# Patient Record
Sex: Male | Born: 1940 | Race: Black or African American | Hispanic: No | Smoking: Former smoker
Health system: Southern US, Community
[De-identification: ages and names within clinical notes are randomized; demographics above are authoritative.]

## PROBLEM LIST (undated history)

## (undated) DIAGNOSIS — J449 Chronic obstructive pulmonary disease, unspecified: Secondary | ICD-10-CM

## (undated) DIAGNOSIS — D573 Sickle-cell trait: Secondary | ICD-10-CM

## (undated) DIAGNOSIS — F1010 Alcohol abuse, uncomplicated: Secondary | ICD-10-CM

## (undated) DIAGNOSIS — N183 Chronic kidney disease, stage 3 (moderate): Secondary | ICD-10-CM

## (undated) DIAGNOSIS — Z0000 Encounter for general adult medical examination without abnormal findings: Secondary | ICD-10-CM

## (undated) DIAGNOSIS — L03119 Cellulitis of unspecified part of limb: Secondary | ICD-10-CM

## (undated) DIAGNOSIS — K219 Gastro-esophageal reflux disease without esophagitis: Secondary | ICD-10-CM

## (undated) DIAGNOSIS — L02419 Cutaneous abscess of limb, unspecified: Secondary | ICD-10-CM

## (undated) DIAGNOSIS — I2510 Atherosclerotic heart disease of native coronary artery without angina pectoris: Secondary | ICD-10-CM

## (undated) DIAGNOSIS — A4902 Methicillin resistant Staphylococcus aureus infection, unspecified site: Secondary | ICD-10-CM

## (undated) DIAGNOSIS — B1710 Acute hepatitis C without hepatic coma: Secondary | ICD-10-CM

## (undated) DIAGNOSIS — N529 Male erectile dysfunction, unspecified: Secondary | ICD-10-CM

## (undated) DIAGNOSIS — M25569 Pain in unspecified knee: Secondary | ICD-10-CM

## (undated) DIAGNOSIS — I10 Essential (primary) hypertension: Secondary | ICD-10-CM

## (undated) DIAGNOSIS — E785 Hyperlipidemia, unspecified: Secondary | ICD-10-CM

## (undated) DIAGNOSIS — I739 Peripheral vascular disease, unspecified: Secondary | ICD-10-CM

## (undated) DIAGNOSIS — N400 Enlarged prostate without lower urinary tract symptoms: Secondary | ICD-10-CM

## (undated) DIAGNOSIS — M1990 Unspecified osteoarthritis, unspecified site: Secondary | ICD-10-CM

## (undated) DIAGNOSIS — M109 Gout, unspecified: Secondary | ICD-10-CM

## (undated) DIAGNOSIS — N259 Disorder resulting from impaired renal tubular function, unspecified: Secondary | ICD-10-CM

## (undated) DIAGNOSIS — I6529 Occlusion and stenosis of unspecified carotid artery: Secondary | ICD-10-CM

## (undated) HISTORY — DX: Alcohol abuse, uncomplicated: F10.10

## (undated) HISTORY — PX: HAND SURGERY: SHX662

## (undated) HISTORY — DX: Occlusion and stenosis of unspecified carotid artery: I65.29

## (undated) HISTORY — DX: Encounter for general adult medical examination without abnormal findings: Z00.00

## (undated) HISTORY — DX: Chronic obstructive pulmonary disease, unspecified: J44.9

## (undated) HISTORY — DX: Sickle-cell trait: D57.3

## (undated) HISTORY — DX: Gout, unspecified: M10.9

## (undated) HISTORY — PX: OTHER SURGICAL HISTORY: SHX169

## (undated) HISTORY — DX: Male erectile dysfunction, unspecified: N52.9

## (undated) HISTORY — DX: Atherosclerotic heart disease of native coronary artery without angina pectoris: I25.10

## (undated) HISTORY — DX: Pain in unspecified knee: M25.569

## (undated) HISTORY — DX: Methicillin resistant Staphylococcus aureus infection, unspecified site: A49.02

## (undated) HISTORY — DX: Benign prostatic hyperplasia without lower urinary tract symptoms: N40.0

## (undated) HISTORY — DX: Hyperlipidemia, unspecified: E78.5

## (undated) HISTORY — DX: Acute hepatitis C without hepatic coma: B17.10

## (undated) HISTORY — DX: Essential (primary) hypertension: I10

## (undated) HISTORY — DX: Cellulitis of unspecified part of limb: L03.119

## (undated) HISTORY — DX: Disorder resulting from impaired renal tubular function, unspecified: N25.9

## (undated) HISTORY — DX: Gastro-esophageal reflux disease without esophagitis: K21.9

## (undated) HISTORY — DX: Unspecified osteoarthritis, unspecified site: M19.90

## (undated) HISTORY — DX: Cutaneous abscess of limb, unspecified: L02.419

## (undated) HISTORY — DX: Peripheral vascular disease, unspecified: I73.9

---

## 1998-11-07 ENCOUNTER — Emergency Department (HOSPITAL_COMMUNITY): Admission: EM | Admit: 1998-11-07 | Discharge: 1998-11-07 | Payer: Self-pay

## 1999-03-12 ENCOUNTER — Encounter: Payer: Self-pay | Admitting: Emergency Medicine

## 1999-03-12 ENCOUNTER — Emergency Department (HOSPITAL_COMMUNITY): Admission: EM | Admit: 1999-03-12 | Discharge: 1999-03-12 | Payer: Self-pay | Admitting: Emergency Medicine

## 1999-08-03 ENCOUNTER — Emergency Department (HOSPITAL_COMMUNITY): Admission: EM | Admit: 1999-08-03 | Discharge: 1999-08-04 | Payer: Self-pay | Admitting: Emergency Medicine

## 1999-09-02 ENCOUNTER — Other Ambulatory Visit: Admission: RE | Admit: 1999-09-02 | Discharge: 1999-09-02 | Payer: Self-pay | Admitting: Ophthalmology

## 1999-09-25 ENCOUNTER — Encounter: Payer: Self-pay | Admitting: Emergency Medicine

## 1999-09-25 ENCOUNTER — Emergency Department (HOSPITAL_COMMUNITY): Admission: EM | Admit: 1999-09-25 | Discharge: 1999-09-25 | Payer: Self-pay | Admitting: Emergency Medicine

## 2000-07-31 ENCOUNTER — Emergency Department (HOSPITAL_COMMUNITY): Admission: EM | Admit: 2000-07-31 | Discharge: 2000-07-31 | Payer: Self-pay | Admitting: Emergency Medicine

## 2001-02-01 ENCOUNTER — Encounter: Payer: Self-pay | Admitting: *Deleted

## 2001-02-01 ENCOUNTER — Emergency Department (HOSPITAL_COMMUNITY): Admission: EM | Admit: 2001-02-01 | Discharge: 2001-02-01 | Payer: Self-pay | Admitting: Emergency Medicine

## 2001-05-23 ENCOUNTER — Emergency Department (HOSPITAL_COMMUNITY): Admission: EM | Admit: 2001-05-23 | Discharge: 2001-05-23 | Payer: Self-pay | Admitting: *Deleted

## 2001-05-24 ENCOUNTER — Emergency Department (HOSPITAL_COMMUNITY): Admission: EM | Admit: 2001-05-24 | Discharge: 2001-05-24 | Payer: Self-pay | Admitting: Emergency Medicine

## 2002-05-07 ENCOUNTER — Emergency Department (HOSPITAL_COMMUNITY): Admission: EM | Admit: 2002-05-07 | Discharge: 2002-05-07 | Payer: Self-pay | Admitting: Emergency Medicine

## 2002-05-07 ENCOUNTER — Encounter: Payer: Self-pay | Admitting: Emergency Medicine

## 2002-06-23 ENCOUNTER — Emergency Department (HOSPITAL_COMMUNITY): Admission: EM | Admit: 2002-06-23 | Discharge: 2002-06-24 | Payer: Self-pay | Admitting: Emergency Medicine

## 2002-06-24 ENCOUNTER — Encounter: Payer: Self-pay | Admitting: Emergency Medicine

## 2002-08-22 ENCOUNTER — Emergency Department (HOSPITAL_COMMUNITY): Admission: EM | Admit: 2002-08-22 | Discharge: 2002-08-22 | Payer: Self-pay

## 2003-07-02 ENCOUNTER — Emergency Department (HOSPITAL_COMMUNITY): Admission: EM | Admit: 2003-07-02 | Discharge: 2003-07-02 | Payer: Self-pay | Admitting: Emergency Medicine

## 2003-12-21 ENCOUNTER — Emergency Department (HOSPITAL_COMMUNITY): Admission: EM | Admit: 2003-12-21 | Discharge: 2003-12-21 | Payer: Self-pay | Admitting: Emergency Medicine

## 2004-11-21 ENCOUNTER — Ambulatory Visit (HOSPITAL_COMMUNITY): Admission: RE | Admit: 2004-11-21 | Discharge: 2004-11-21 | Payer: Self-pay | Admitting: Gastroenterology

## 2005-01-14 ENCOUNTER — Emergency Department (HOSPITAL_COMMUNITY): Admission: EM | Admit: 2005-01-14 | Discharge: 2005-01-14 | Payer: Self-pay | Admitting: Emergency Medicine

## 2005-01-20 LAB — HM COLONOSCOPY: HM Colonoscopy: NORMAL

## 2005-04-28 ENCOUNTER — Encounter: Admission: RE | Admit: 2005-04-28 | Discharge: 2005-04-28 | Payer: Self-pay | Admitting: Orthopedic Surgery

## 2005-07-28 ENCOUNTER — Encounter: Admission: RE | Admit: 2005-07-28 | Discharge: 2005-08-14 | Payer: Self-pay | Admitting: Orthopedic Surgery

## 2005-08-20 ENCOUNTER — Encounter: Admission: RE | Admit: 2005-08-20 | Discharge: 2005-08-20 | Payer: Self-pay | Admitting: Gastroenterology

## 2005-09-25 ENCOUNTER — Encounter: Admission: RE | Admit: 2005-09-25 | Discharge: 2005-09-25 | Payer: Self-pay | Admitting: Orthopedic Surgery

## 2005-12-09 ENCOUNTER — Inpatient Hospital Stay (HOSPITAL_COMMUNITY): Admission: EM | Admit: 2005-12-09 | Discharge: 2005-12-11 | Payer: Self-pay | Admitting: Family Medicine

## 2005-12-10 ENCOUNTER — Encounter: Payer: Self-pay | Admitting: Cardiology

## 2005-12-10 ENCOUNTER — Ambulatory Visit: Payer: Self-pay | Admitting: Cardiology

## 2005-12-29 ENCOUNTER — Emergency Department (HOSPITAL_COMMUNITY): Admission: EM | Admit: 2005-12-29 | Discharge: 2005-12-29 | Payer: Self-pay | Admitting: Emergency Medicine

## 2006-01-08 ENCOUNTER — Emergency Department (HOSPITAL_COMMUNITY): Admission: EM | Admit: 2006-01-08 | Discharge: 2006-01-08 | Payer: Self-pay | Admitting: Emergency Medicine

## 2006-04-10 ENCOUNTER — Emergency Department (HOSPITAL_COMMUNITY): Admission: EM | Admit: 2006-04-10 | Discharge: 2006-04-10 | Payer: Self-pay | Admitting: Family Medicine

## 2006-06-12 ENCOUNTER — Emergency Department (HOSPITAL_COMMUNITY): Admission: EM | Admit: 2006-06-12 | Discharge: 2006-06-12 | Payer: Self-pay | Admitting: Family Medicine

## 2006-06-26 ENCOUNTER — Emergency Department (HOSPITAL_COMMUNITY): Admission: EM | Admit: 2006-06-26 | Discharge: 2006-06-26 | Payer: Self-pay | Admitting: Emergency Medicine

## 2006-09-22 ENCOUNTER — Emergency Department (HOSPITAL_COMMUNITY): Admission: EM | Admit: 2006-09-22 | Discharge: 2006-09-22 | Payer: Self-pay | Admitting: Emergency Medicine

## 2006-12-30 ENCOUNTER — Observation Stay (HOSPITAL_COMMUNITY): Admission: EM | Admit: 2006-12-30 | Discharge: 2007-01-02 | Payer: Self-pay | Admitting: Emergency Medicine

## 2007-01-01 ENCOUNTER — Encounter: Payer: Self-pay | Admitting: Cardiology

## 2007-01-18 ENCOUNTER — Emergency Department (HOSPITAL_COMMUNITY): Admission: EM | Admit: 2007-01-18 | Discharge: 2007-01-18 | Payer: Self-pay | Admitting: Family Medicine

## 2007-05-12 ENCOUNTER — Emergency Department (HOSPITAL_COMMUNITY): Admission: EM | Admit: 2007-05-12 | Discharge: 2007-05-12 | Payer: Self-pay | Admitting: Emergency Medicine

## 2007-10-06 ENCOUNTER — Encounter: Admission: RE | Admit: 2007-10-06 | Discharge: 2007-10-06 | Payer: Self-pay | Admitting: Family Medicine

## 2007-11-27 ENCOUNTER — Emergency Department (HOSPITAL_COMMUNITY): Admission: EM | Admit: 2007-11-27 | Discharge: 2007-11-27 | Payer: Self-pay | Admitting: Emergency Medicine

## 2008-03-02 ENCOUNTER — Observation Stay (HOSPITAL_COMMUNITY): Admission: EM | Admit: 2008-03-02 | Discharge: 2008-03-05 | Payer: Self-pay | Admitting: Emergency Medicine

## 2008-07-10 ENCOUNTER — Emergency Department (HOSPITAL_COMMUNITY): Admission: EM | Admit: 2008-07-10 | Discharge: 2008-07-10 | Payer: Self-pay | Admitting: Family Medicine

## 2008-07-15 ENCOUNTER — Emergency Department (HOSPITAL_COMMUNITY): Admission: EM | Admit: 2008-07-15 | Discharge: 2008-07-15 | Payer: Self-pay | Admitting: Family Medicine

## 2008-07-18 ENCOUNTER — Inpatient Hospital Stay (HOSPITAL_COMMUNITY): Admission: EM | Admit: 2008-07-18 | Discharge: 2008-07-20 | Payer: Self-pay | Admitting: Emergency Medicine

## 2008-07-18 ENCOUNTER — Emergency Department (HOSPITAL_COMMUNITY): Admission: EM | Admit: 2008-07-18 | Discharge: 2008-07-18 | Payer: Self-pay | Admitting: Family Medicine

## 2008-07-27 ENCOUNTER — Ambulatory Visit: Payer: Self-pay | Admitting: Surgery

## 2009-02-10 ENCOUNTER — Emergency Department (HOSPITAL_COMMUNITY): Admission: EM | Admit: 2009-02-10 | Discharge: 2009-02-10 | Payer: Self-pay | Admitting: Emergency Medicine

## 2009-02-28 ENCOUNTER — Emergency Department (HOSPITAL_COMMUNITY): Admission: EM | Admit: 2009-02-28 | Discharge: 2009-02-28 | Payer: Self-pay | Admitting: Emergency Medicine

## 2009-03-02 ENCOUNTER — Emergency Department (HOSPITAL_COMMUNITY): Admission: EM | Admit: 2009-03-02 | Discharge: 2009-03-02 | Payer: Self-pay | Admitting: Family Medicine

## 2009-03-16 ENCOUNTER — Emergency Department (HOSPITAL_COMMUNITY): Admission: EM | Admit: 2009-03-16 | Discharge: 2009-03-16 | Payer: Self-pay | Admitting: Family Medicine

## 2009-04-07 ENCOUNTER — Emergency Department (HOSPITAL_COMMUNITY): Admission: EM | Admit: 2009-04-07 | Discharge: 2009-04-07 | Payer: Self-pay | Admitting: Emergency Medicine

## 2009-05-30 ENCOUNTER — Emergency Department (HOSPITAL_COMMUNITY): Admission: EM | Admit: 2009-05-30 | Discharge: 2009-05-30 | Payer: Self-pay | Admitting: Emergency Medicine

## 2009-06-13 ENCOUNTER — Emergency Department (HOSPITAL_COMMUNITY): Admission: EM | Admit: 2009-06-13 | Discharge: 2009-06-13 | Payer: Self-pay | Admitting: Family Medicine

## 2009-08-12 ENCOUNTER — Emergency Department (HOSPITAL_COMMUNITY): Admission: EM | Admit: 2009-08-12 | Discharge: 2009-08-12 | Payer: Self-pay | Admitting: Emergency Medicine

## 2009-08-24 ENCOUNTER — Ambulatory Visit: Payer: Self-pay | Admitting: Internal Medicine

## 2009-08-24 ENCOUNTER — Encounter: Payer: Self-pay | Admitting: Internal Medicine

## 2009-08-24 DIAGNOSIS — N529 Male erectile dysfunction, unspecified: Secondary | ICD-10-CM

## 2009-08-24 DIAGNOSIS — I10 Essential (primary) hypertension: Secondary | ICD-10-CM

## 2009-08-24 DIAGNOSIS — J4489 Other specified chronic obstructive pulmonary disease: Secondary | ICD-10-CM

## 2009-08-24 DIAGNOSIS — M109 Gout, unspecified: Secondary | ICD-10-CM

## 2009-08-24 DIAGNOSIS — N259 Disorder resulting from impaired renal tubular function, unspecified: Secondary | ICD-10-CM | POA: Insufficient documentation

## 2009-08-24 DIAGNOSIS — F1010 Alcohol abuse, uncomplicated: Secondary | ICD-10-CM | POA: Insufficient documentation

## 2009-08-24 DIAGNOSIS — I2510 Atherosclerotic heart disease of native coronary artery without angina pectoris: Secondary | ICD-10-CM

## 2009-08-24 DIAGNOSIS — J441 Chronic obstructive pulmonary disease with (acute) exacerbation: Secondary | ICD-10-CM | POA: Insufficient documentation

## 2009-08-24 DIAGNOSIS — J449 Chronic obstructive pulmonary disease, unspecified: Secondary | ICD-10-CM

## 2009-08-24 DIAGNOSIS — E785 Hyperlipidemia, unspecified: Secondary | ICD-10-CM

## 2009-08-24 DIAGNOSIS — I739 Peripheral vascular disease, unspecified: Secondary | ICD-10-CM

## 2009-08-24 DIAGNOSIS — B1710 Acute hepatitis C without hepatic coma: Secondary | ICD-10-CM

## 2009-08-24 DIAGNOSIS — A4902 Methicillin resistant Staphylococcus aureus infection, unspecified site: Secondary | ICD-10-CM

## 2009-08-24 DIAGNOSIS — D573 Sickle-cell trait: Secondary | ICD-10-CM

## 2009-08-24 HISTORY — DX: Hyperlipidemia, unspecified: E78.5

## 2009-08-24 HISTORY — DX: Essential (primary) hypertension: I10

## 2009-08-24 HISTORY — DX: Peripheral vascular disease, unspecified: I73.9

## 2009-08-24 HISTORY — DX: Sickle-cell trait: D57.3

## 2009-08-24 HISTORY — DX: Alcohol abuse, uncomplicated: F10.10

## 2009-08-24 HISTORY — DX: Disorder resulting from impaired renal tubular function, unspecified: N25.9

## 2009-08-24 HISTORY — DX: Acute hepatitis C without hepatic coma: B17.10

## 2009-08-24 HISTORY — DX: Atherosclerotic heart disease of native coronary artery without angina pectoris: I25.10

## 2009-08-24 HISTORY — DX: Gout, unspecified: M10.9

## 2009-08-24 HISTORY — DX: Other specified chronic obstructive pulmonary disease: J44.89

## 2009-08-24 HISTORY — DX: Chronic obstructive pulmonary disease, unspecified: J44.9

## 2009-08-24 HISTORY — DX: Male erectile dysfunction, unspecified: N52.9

## 2009-08-24 HISTORY — DX: Methicillin resistant Staphylococcus aureus infection, unspecified site: A49.02

## 2009-08-27 ENCOUNTER — Telehealth (INDEPENDENT_AMBULATORY_CARE_PROVIDER_SITE_OTHER): Payer: Self-pay | Admitting: *Deleted

## 2009-08-27 LAB — CONVERTED CEMR LAB
ALT: 55 units/L — ABNORMAL HIGH (ref 0–53)
AST: 53 units/L — ABNORMAL HIGH (ref 0–37)
BUN: 32 mg/dL — ABNORMAL HIGH (ref 6–23)
Bilirubin Urine: NEGATIVE
Bilirubin, Direct: 0.1 mg/dL (ref 0.0–0.3)
Calcium: 9.3 mg/dL (ref 8.4–10.5)
Creatinine, Ser: 1.5 mg/dL (ref 0.4–1.5)
Eosinophils Relative: 1.5 % (ref 0.0–5.0)
GFR calc non Af Amer: 58.14 mL/min (ref >60)
Ketones, ur: NEGATIVE mg/dL
LDL Cholesterol: 103 mg/dL — ABNORMAL HIGH (ref 0–99)
Leukocytes, UA: NEGATIVE
Monocytes Relative: 13.4 % — ABNORMAL HIGH (ref 3.0–12.0)
Neutrophils Relative %: 50.1 % (ref 43.0–77.0)
PSA: 0.58 ng/mL (ref 0.10–4.00)
Platelets: 291 10*3/uL (ref 150.0–400.0)
Total Bilirubin: 0.4 mg/dL (ref 0.3–1.2)
Total CHOL/HDL Ratio: 5
Triglycerides: 157 mg/dL — ABNORMAL HIGH (ref 0.0–149.0)
Uric Acid, Serum: 9.3 mg/dL — ABNORMAL HIGH (ref 4.0–7.8)
Urine Glucose: NEGATIVE mg/dL
VLDL: 31.4 mg/dL (ref 0.0–40.0)
WBC: 7.3 10*3/uL (ref 4.5–10.5)
pH: 5 (ref 5.0–8.0)

## 2009-08-29 ENCOUNTER — Encounter: Payer: Self-pay | Admitting: Internal Medicine

## 2009-08-30 ENCOUNTER — Encounter: Payer: Self-pay | Admitting: Internal Medicine

## 2009-08-30 DIAGNOSIS — I6529 Occlusion and stenosis of unspecified carotid artery: Secondary | ICD-10-CM | POA: Insufficient documentation

## 2009-08-30 HISTORY — DX: Occlusion and stenosis of unspecified carotid artery: I65.29

## 2009-08-31 ENCOUNTER — Ambulatory Visit: Payer: Self-pay

## 2009-08-31 ENCOUNTER — Encounter: Payer: Self-pay | Admitting: Internal Medicine

## 2009-09-06 ENCOUNTER — Telehealth: Payer: Self-pay | Admitting: Internal Medicine

## 2009-09-13 ENCOUNTER — Emergency Department (HOSPITAL_COMMUNITY): Admission: EM | Admit: 2009-09-13 | Discharge: 2009-09-13 | Payer: Self-pay | Admitting: Emergency Medicine

## 2009-10-04 ENCOUNTER — Ambulatory Visit: Payer: Self-pay | Admitting: Internal Medicine

## 2009-10-04 DIAGNOSIS — M109 Gout, unspecified: Secondary | ICD-10-CM

## 2009-10-04 DIAGNOSIS — J019 Acute sinusitis, unspecified: Secondary | ICD-10-CM | POA: Insufficient documentation

## 2009-10-29 ENCOUNTER — Telehealth: Payer: Self-pay | Admitting: Internal Medicine

## 2009-11-02 ENCOUNTER — Telehealth: Payer: Self-pay | Admitting: Internal Medicine

## 2009-11-08 ENCOUNTER — Telehealth: Payer: Self-pay | Admitting: Internal Medicine

## 2009-11-27 ENCOUNTER — Ambulatory Visit: Payer: Self-pay | Admitting: Internal Medicine

## 2009-11-27 DIAGNOSIS — L02419 Cutaneous abscess of limb, unspecified: Secondary | ICD-10-CM

## 2009-11-27 DIAGNOSIS — L03119 Cellulitis of unspecified part of limb: Secondary | ICD-10-CM

## 2009-11-27 HISTORY — DX: Cutaneous abscess of limb, unspecified: L02.419

## 2009-11-27 HISTORY — DX: Cellulitis of unspecified part of limb: L03.119

## 2010-01-08 ENCOUNTER — Telehealth: Payer: Self-pay | Admitting: Internal Medicine

## 2010-01-30 ENCOUNTER — Telehealth: Payer: Self-pay | Admitting: Internal Medicine

## 2010-02-09 ENCOUNTER — Encounter: Payer: Self-pay | Admitting: Orthopedic Surgery

## 2010-02-14 ENCOUNTER — Telehealth: Payer: Self-pay | Admitting: Internal Medicine

## 2010-02-14 DIAGNOSIS — M25569 Pain in unspecified knee: Secondary | ICD-10-CM | POA: Insufficient documentation

## 2010-02-14 HISTORY — DX: Pain in unspecified knee: M25.569

## 2010-02-19 NOTE — Progress Notes (Signed)
  Phone Note Outgoing Call Call back at [facility] Phone [phone]   Call placed by: Robin Call placed to: Patient Summary of Call: Called pt to inform Viargra from patient assistance has arrived and he can pickup at our office. Patient was not at home left msg. with his wife to have him  call back and I will then inform. Initial call taken by: Robin Ewing CMA Duncan Dull),  November 08, 2009 11:36 AM  Follow-up for Phone Call        called pt informed wife that prescription patient ordered through patient assistance had arrived and he could pickup at front desk. Follow-up by: Zella Ball Ewing CMA Duncan Dull),  November 08, 2009 3:15 PM

## 2010-02-19 NOTE — Progress Notes (Signed)
Summary: MED REFILL  Phone Note Refill Request Call back at [phone]   Refills Requested: Medication #1:  VIAGRA 100 MG TABS 1po once daily as needed   Supply Requested: 3 months  Method Requested: Fax to [facility] Away Pharm[facility] Initial call taken by: Migdalia Dk,  October 29, 2009 2:32 PM  Follow-up for Phone Call        Okay to Rx 90 day supply? Follow-up by: Margaret Pyle, CMA,  October 29, 2009 2:37 PM  Additional Follow-up for Phone Call Additional follow up Details #1::        ok - to robin for routine Additional Follow-up by: [name] MD,  October 29, 2009 5:55 PM    Additional Follow-up for Phone Call Additional follow up Details #2::    called pt. and patient request prescription sent to [facility]. Printed prescription and faxed to [facility] at [phone]. Will call patient to pickup prescription once arrive at our office. Follow-up by: Zella Ball Ewing CMA Duncan Dull),  October 30, 2009 10:51 AM  Prescriptions: VIAGRA 100 MG TABS (SILDENAFIL CITRATE) 1po once daily as needed  #30 x 0   Entered by:   Scharlene Gloss CMA (AAMA)   Authorized by:   [name] MD   Signed by:   Scharlene Gloss CMA (AAMA) on 10/30/2009   Method used:   Printed then [facility]ed to ...       [facility]  [address] [phone]* (retail)       [address]       [place], [place]  [id number]       Ph: [phone] or [phone]       Fax: [phone]   RxID:   [id number]

## 2010-02-19 NOTE — Assessment & Plan Note (Signed)
Summary: nose bleed in the am/gout flare up in feet-lb   Vital Signs:  Patient profile:   70 year old male Height:      73 inches Weight:      174.38 pounds BMI:     23.09 O2 Sat:      96 % on Room air Temp:     97.4 degrees F oral Pulse rate:   97 / minute BP sitting:   128 / 62  (left arm) Cuff size:   regular  Vitals Entered By: Zella Ball Ewing CMA Duncan Dull) (October 04, 2009 2:43 PM)  O2 Flow:  Room air CC: Nausea, Nose Bleeds, chills, left foot big toe gout/RE   CC:  Nausea, Nose Bleeds, chills, and left foot big toe gout/RE.  History of Present Illness: here for acute visit - c/o 3 days acute onset mild to mod facial pain, pressure, fever and greenish d/c and nausea, with an isolated episode of nosebleed this am, without other bruise or bleeding;  does have several chills but no rigors, rash; incidently also with severe pain to the left first MTP with swelling and redness;  seemed to start with the left ankle and he tx with indocin but ran out a few days ago now; also  with swelling to the right hand 3rd MCP recently;  has seen hand surgone with MRI planned in 2 days.  no trauma, fall or other inury.  Trying to follow lower chol diet.  Pt denies CP, worsening sob, doe, wheezing, orthopnea, pnd, worsening LE edema, palps, dizziness or syncope  Pt denies new neuro symptoms such as headache, facial or extremity weakness   Problems Prior to Update: 1)  Sinusitis- Acute-nos  (ICD-461.9) 2)  Acute Gouty Arthropathy  (ICD-274.01) 3)  Carotid Artery Disease  (ICD-433.10) 4)  Preventive Health Care  (ICD-V70.0) 5)  Erectile Dysfunction, Organic  (ICD-607.84) 6)  Gout  (ICD-274.9) 7)  Alcohol Abuse  (ICD-305.00) 8)  Mrsa  (ICD-041.12) 9)  Renal Insufficiency  (ICD-588.9) 10)  Sickle Cell Trait  (ICD-282.5) 11)  Hepatitis C  (ICD-070.51) 12)  COPD  (ICD-496) 13)  Hypertension  (ICD-401.9) 14)  Hyperlipidemia  (ICD-272.4) 15)  Coronary Artery Disease  (ICD-414.00) 16)  Peripheral  Vascular Disease  (ICD-443.9)  Medications Prior to Update: 1)  Indomethacin 50 Mg Caps (Indomethacin) .Marland Kitchen.. 1 By Mouth Every 8 Hours For 2 Weeks 2)  Amlodipine Besylate 2.5 Mg Tabs (Amlodipine Besylate) .Marland Kitchen.. 1po Once Daily 3)  Aspir-Low 81 Mg Tbec (Aspirin) .Marland Kitchen.. 1 By Mouth Once Daily 4)  Viagra 100 Mg Tabs (Sildenafil Citrate) .Marland Kitchen.. 1po Once Daily As Needed 5)  Nitrofurantoin Macrocrystal 100 Mg Caps (Nitrofurantoin Macrocrystal) .Marland Kitchen.. 1 By Mouth Two Times A Day  Current Medications (verified): 1)  Indomethacin 50 Mg Caps (Indomethacin) .Marland Kitchen.. 1 By Mouth Three Times A Day As Needed Gout Attack 2)  Amlodipine Besylate 2.5 Mg Tabs (Amlodipine Besylate) .Marland Kitchen.. 1po Once Daily 3)  Aspir-Low 81 Mg Tbec (Aspirin) .Marland Kitchen.. 1 By Mouth Once Daily 4)  Viagra 100 Mg Tabs (Sildenafil Citrate) .Marland Kitchen.. 1po Once Daily As Needed 5)  Allopurinol 100 Mg Tabs (Allopurinol) .Marland Kitchen.. 1po Once Daily 6)  Prednisone 10 Mg Tabs (Prednisone) .... 4po Qd For 3days, Then 3po Qd For 3days, Then 2po Qd For 3days, Then 1po Qd For 3 Days, Then Stop 7)  Hydrocodone-Acetaminophen 5-325 Mg Tabs (Hydrocodone-Acetaminophen) .Marland Kitchen.. 1-2 By Mouth Q 6 Hrs As Needed Pain 8)  Pravachol 20 Mg Tabs (Pravastatin Sodium) .Marland Kitchen.. 1po Once Daily 9)  Cephalexin 500 Mg Tabs (Cephalexin) .Marland Kitchen.. 1 By Mouth Three Times A Day  Allergies (verified): No Known Drug Allergies  Past History:  Past Medical History: Last updated: 08/24/2009 Peripheral vascular disease > 50% LICA  - aug 2010 Coronary artery disease - nonobstructive - cath feb 2010 Hyperlipidemia Hypertension COPD/chronic bronchitis Hepatitis C/hx of narcotic polysubstance abuse - heroin, cocaine, and other - none since 1991 Sickle Cell trait hx of syphilis - treated Renal insufficiency MRSA - abscess  - Feb 2011 hx of ETOH abuse  Gout MD Roster:      Hand ortho - Dr Mina Marble E.D.  Past Surgical History: Last updated: 08/24/2009 s/p knee surgury - arthroscopic on the right - Dr  Madelon Lips  Social History: Last updated: 08/24/2009 lives with fiance 10 children Former Smoker Alcohol use-no retired - former Airline pilot Drug use-no  Risk Factors: Smoking Status: quit (08/24/2009)  Review of Systems       all otherwise negative per pt -    Physical Exam  General:  alert and well-developed.  , mild ill  Head:  normocephalic and atraumatic.   Eyes:  vision grossly intact, pupils equal, and pupils round.   Ears:  bilat tm's red, sinus tender bilat Nose:  nasal dischargemucosal pallor and mucosal edema.   Mouth:  pharyngeal erythema and fair dentition.   Neck:  supple and no masses.   Lungs:  normal respiratory effort and normal breath sounds.   Heart:  normal rate and regular rhythm.   Msk:  right 3rd mcp 3+ tender, swelling;  left 1st MTP 2-3+ red, tender , swelling -   also left ankle with mild tender, trace effusion Extremities:  no edema, no erythema    Impression & Recommendations:  Problem # 1:  ACUTE GOUTY ARTHROPATHY (ICD-274.01)  left ankle - mild, but severe left first MTP, as well as possible right 3rd MCP as well - for depomedrol today, pred pack, pain med, then start allopurinol 100 after that  His updated medication list for this problem includes:    Allopurinol 100 Mg Tabs (Allopurinol) .Marland Kitchen... 1po once daily  Orders: Depo- Medrol 40mg  (J1030) Depo- Medrol 80mg  (J1040) Admin of Therapeutic Inj  intramuscular or subcutaneous (04540)  Problem # 2:  PERIPHERAL VASCULAR DISEASE (ICD-443.9) carotid - d/w pt - to cont asa 81 mg, f/u 1 yr carotids as planned  Problem # 3:  HYPERLIPIDEMIA (ICD-272.4)  Labs Reviewed: SGOT: 53 (08/24/2009)   SGPT: 55 (08/24/2009)   HDL:32.80 (08/24/2009)  LDL:103 (08/24/2009)  Chol:167 (08/24/2009)  Trig:157.0 (08/24/2009) d/w pt - goal ldl < 70 - to start pravachol 20 once daily   His updated medication list for this problem includes:    Pravachol 20 Mg Tabs (Pravastatin sodium) .Marland Kitchen... 1po once  daily  Problem # 4:  SINUSITIS- ACUTE-NOS (ICD-461.9)  His updated medication list for this problem includes:    Cephalexin 500 Mg Tabs (Cephalexin) .Marland Kitchen... 1 by mouth three times a day treat as above, f/u any worsening signs or symptoms   Complete Medication List: 1)  Indomethacin 50 Mg Caps (Indomethacin) .Marland Kitchen.. 1 by mouth three times a day as needed gout attack 2)  Amlodipine Besylate 2.5 Mg Tabs (Amlodipine besylate) .Marland Kitchen.. 1po once daily 3)  Aspir-low 81 Mg Tbec (Aspirin) .Marland Kitchen.. 1 by mouth once daily 4)  Viagra 100 Mg Tabs (Sildenafil citrate) .Marland Kitchen.. 1po once daily as needed 5)  Allopurinol 100 Mg Tabs (Allopurinol) .Marland Kitchen.. 1po once daily 6)  Prednisone 10 Mg Tabs (Prednisone) .Marland KitchenMarland KitchenMarland Kitchen  4po qd for 3days, then 3po qd for 3days, then 2po qd for 3days, then 1po qd for 3 days, then stop 7)  Hydrocodone-acetaminophen 5-325 Mg Tabs (Hydrocodone-acetaminophen) .Marland Kitchen.. 1-2 by mouth q 6 hrs as needed pain 8)  Pravachol 20 Mg Tabs (Pravastatin sodium) .Marland Kitchen.. 1po once daily 9)  Cephalexin 500 Mg Tabs (Cephalexin) .Marland Kitchen.. 1 by mouth three times a day  Patient Instructions: 1)  you had the steroid shot today 2)  Please take all new medications as prescribed  - the pain medication and prednisone as prescribed, and the antibiotic for the sinus 3)  AFTER the prednisone is done, you can start the allopurinol medication to help prevent the gout attacks in the future 4)  start the pravachol 20 mg for cholesterol 5)  Continue all other previous medications as before this visit , except DON'T take the indomethacin the same day you take prednisone 6)  Please schedule a follow-up appointment in 2 months, or sooner if needed Prescriptions: INDOMETHACIN 50 MG CAPS (INDOMETHACIN) 1 by mouth three times a day as needed gout attack  #60 x 5   Entered and Authorized by:   [name] MD   Signed by:   [name] MD on 10/04/2009   Method used:   Print then Give to Patient   RxID:   [id number] CEPHALEXIN 500 MG TABS  (CEPHALEXIN) 1 by mouth three times a day  #30 x 0   Entered and Authorized by:   [name] MD   Signed by:   [name] MD on 10/04/2009   Method used:   Print then Give to Patient   RxID:   [id number] PRAVACHOL 20 MG TABS (PRAVASTATIN SODIUM) 1po once daily  #90 x 3   Entered and Authorized by:   [name] MD   Signed by:   [name] MD on 10/04/2009   Method used:   Print then Give to Patient   RxID:   [id number] HYDROCODONE-ACETAMINOPHEN 5-325 MG TABS (HYDROCODONE-ACETAMINOPHEN) 1-2 by mouth q 6 hrs as needed pain  #40 x 1   Entered and Authorized by:   [name] MD   Signed by:   [name] MD on 10/04/2009   Method used:   Print then Give to Patient   RxID:   [id number] PREDNISONE 10 MG TABS (PREDNISONE) 4po qd for 3days, then 3po qd for 3days, then 2po qd for 3days, then 1po qd for 3 days, then stop  #30 x 0   Entered and Authorized by:   [name] MD   Signed by:   [name] MD on 10/04/2009   Method used:   Print then Give to Patient   RxID:   [id number] ALLOPURINOL 100 MG TABS (ALLOPURINOL) 1po once daily  #30 x 11   Entered and Authorized by:   [name] MD   Signed by:   [name] MD on 10/04/2009   Method used:   Print then Give to Patient   RxID:   [id number] INDOMETHACIN 50 MG CAPS (INDOMETHACIN) 1 by mouth every 8 hours for 2 weeks  #60 x 5   Entered and Authorized by:   [name] MD   Signed by:   [name] MD on 10/04/2009   Method used:   Print then Give to Patient   RxID:   [id number]    Medication Administration  Injection # 1:    Medication: Depo-  Medrol 40mg     Diagnosis: ACUTE GOUTY ARTHROPATHY (ICD-274.01)    Route: IM    Site: LUOQ gluteus    Exp Date: 04/2012    Lot #: [id number]    Mfr: [employer]    Comments: Patient received 120mg  Depo-Medrol    Patient tolerated injection without complications    Given by: Zella Ball Ewing CMA (AAMA) (October 04, 2009 3:22 PM)  Injection # 2:     Medication: Depo- Medrol 80mg     Diagnosis: ACUTE GOUTY ARTHROPATHY (ICD-274.01)    Route: IM    Site: LUOQ gluteus    Exp Date: 04/2012    Lot #: [id number]    Mfr: [employer]    Given by: Zella Ball Ewing CMA Duncan Dull) (October 04, 2009 3:22 PM)  Orders Added: 1)  Depo- Medrol 40mg  [J1030] 2)  Depo- Medrol 80mg  [J1040] 3)  Admin of Therapeutic Inj  intramuscular or subcutaneous [96372] 4)  Est. Patient Level IV [98119]

## 2010-02-19 NOTE — Medication Information (Signed)
Summary: Environmental consultant   Imported By: Sherian Rein 09/13/2009 15:00:39  _____________________________________________________________________  External Attachment:    Type:   Image     Comment:   External Document

## 2010-02-19 NOTE — Assessment & Plan Note (Signed)
Summary: 3 MO ROV /NWS  #   Vital Signs:  Patient profile:   70 year old male Height:      73 inches Weight:      182 pounds BMI:     24.10 O2 Sat:      96 % on Room air Temp:     97.5 degrees F oral Pulse rate:   73 / minute BP sitting:   110 / 64  (left arm) Cuff size:   regular  Vitals Entered By: Zella Ball Ewing CMA Duncan Dull) (November 27, 2009 4:24 PM)  O2 Flow:  Room air CC: 3 month ROV/RE   CC:  3 month ROV/RE.  History of Present Illness: here for acute onset mild small right ant thigh abscess without f/c that drained this am, but still hurts.  Pt denies CP, worsening sob, doe, wheezing, orthopnea, pnd, worsening LE edema, palps, dizziness or syncope  Pt denies new neuro symptoms such as headache, facial or extremity weakness  Pt denies polydipsia, polyuria,.  Overall  compliance fair only with meds, trying to follow low chol diet, wt stable, little excercise however  No recurrent gout after tx last visit with acute meds adn allopurinol.  No joint pain at this time or decreased ROM, back pain , gait change or falls.  No fever, wt loss, night sweats, loss of appetite or other constitutional symptoms   Problems Prior to Update: 1)  Sinusitis- Acute-nos  (ICD-461.9) 2)  Acute Gouty Arthropathy  (ICD-274.01) 3)  Carotid Artery Disease  (ICD-433.10) 4)  Preventive Health Care  (ICD-V70.0) 5)  Erectile Dysfunction, Organic  (ICD-607.84) 6)  Gout  (ICD-274.9) 7)  Alcohol Abuse  (ICD-305.00) 8)  Mrsa  (ICD-041.12) 9)  Renal Insufficiency  (ICD-588.9) 10)  Sickle Cell Trait  (ICD-282.5) 11)  Hepatitis C  (ICD-070.51) 12)  COPD  (ICD-496) 13)  Hypertension  (ICD-401.9) 14)  Hyperlipidemia  (ICD-272.4) 15)  Coronary Artery Disease  (ICD-414.00) 16)  Peripheral Vascular Disease  (ICD-443.9)  Medications Prior to Update: 1)  Indomethacin 50 Mg Caps (Indomethacin) .Marland Kitchen.. 1 By Mouth Three Times A Day As Needed Gout Attack 2)  Amlodipine Besylate 2.5 Mg Tabs (Amlodipine Besylate) .Marland Kitchen..  1po Once Daily 3)  Aspir-Low 81 Mg Tbec (Aspirin) .Marland Kitchen.. 1 By Mouth Once Daily 4)  Viagra 100 Mg Tabs (Sildenafil Citrate) .Marland Kitchen.. 1po Once Daily As Needed 5)  Allopurinol 100 Mg Tabs (Allopurinol) .Marland Kitchen.. 1po Once Daily 6)  Prednisone 10 Mg Tabs (Prednisone) .... 4po Qd For 3days, Then 3po Qd For 3days, Then 2po Qd For 3days, Then 1po Qd For 3 Days, Then Stop 7)  Hydrocodone-Acetaminophen 5-325 Mg Tabs (Hydrocodone-Acetaminophen) .Marland Kitchen.. 1-2 By Mouth Q 6 Hrs As Needed Pain 8)  Pravachol 20 Mg Tabs (Pravastatin Sodium) .Marland Kitchen.. 1po Once Daily 9)  Cephalexin 500 Mg Tabs (Cephalexin) .Marland Kitchen.. 1 By Mouth Three Times A Day 10)  Levitra 20 Mg Tabs (Vardenafil Hcl) .Marland Kitchen.. 1po Once Daily As Needed  Current Medications (verified): 1)  Indomethacin 50 Mg Caps (Indomethacin) .Marland Kitchen.. 1 By Mouth Three Times A Day As Needed Gout Attack 2)  Amlodipine Besylate 2.5 Mg Tabs (Amlodipine Besylate) .Marland Kitchen.. 1po Once Daily 3)  Aspir-Low 81 Mg Tbec (Aspirin) .Marland Kitchen.. 1 By Mouth Once Daily 4)  Viagra 100 Mg Tabs (Sildenafil Citrate) .Marland Kitchen.. 1po Once Daily As Needed 5)  Allopurinol 100 Mg Tabs (Allopurinol) .Marland Kitchen.. 1po Once Daily 6)  Pravachol 20 Mg Tabs (Pravastatin Sodium) .Marland Kitchen.. 1po Once Daily 7)  Bactrim Ds 800-160 Mg Tabs (Sulfamethoxazole-Trimethoprim) .Marland KitchenMarland KitchenMarland Kitchen  1 By Mouth Two Times A Day  Allergies (verified): No Known Drug Allergies  Past History:  Past Medical History: Last updated: 08/24/2009 Peripheral vascular disease > 50% LICA  - aug 2010 Coronary artery disease - nonobstructive - cath feb 2010 Hyperlipidemia Hypertension COPD/chronic bronchitis Hepatitis C/hx of narcotic polysubstance abuse - heroin, cocaine, and other - none since 1991 Sickle Cell trait hx of syphilis - treated Renal insufficiency MRSA - abscess  - Feb 2011 hx of ETOH abuse  Gout MD Roster:      Hand ortho - Dr Mina Marble E.D.  Past Surgical History: Last updated: 08/24/2009 s/p knee surgury - arthroscopic on the right - Dr Madelon Lips  Social History: Last  updated: 08/24/2009 lives with fiance 10 children Former Smoker Alcohol use-no retired - former Airline pilot Drug use-no  Risk Factors: Smoking Status: quit (08/24/2009)  Review of Systems       all otherwise negative per pt -    Physical Exam  General:  alert and well-developed.  , mild ill  Head:  normocephalic and atraumatic.   Eyes:  vision grossly intact, pupils equal, and pupils round.   Ears:  R ear normal and L ear normal.   Nose:  no external deformity and no nasal discharge.   Mouth:  no gingival abnormalities and pharynx pink and moist.   Neck:  supple and no masses.   Lungs:  normal respiratory effort and normal breath sounds.   Heart:  normal rate and regular rhythm.   Abdomen:  soft, non-tender, and normal bowel sounds.   Msk:  no joint tenderness and no joint swelling.   Extremities:  no edema, no erythema  Skin:  right ant thigh with 1.5 cm celluitic area , mod tender with slight drainiage, non fluctuant Axillary Nodes:  2 tender subq lymph node noted < 1/2 cm, mild tender, nondfluctuant   Impression & Recommendations:  Problem # 1:  CELLULITIS AND ABSCESS OF LEG EXCEPT FOOT (ICD-682.6)  The following medications were removed from the medication list:    Cephalexin 500 Mg Tabs (Cephalexin) .Marland Kitchen... 1 by mouth three times a day His updated medication list for this problem includes:    Bactrim Ds 800-160 Mg Tabs (Sulfamethoxazole-trimethoprim) .Marland Kitchen... 1 by mouth two times a day small mid right ant thigh - treat as above, f/u any worsening signs or symptoms  Problem # 2:  ACUTE GOUTY ARTHROPATHY (ICD-274.01)  His updated medication list for this problem includes:    Allopurinol 100 Mg Tabs (Allopurinol) .Marland Kitchen... 1po once daily resolved, cont meds as is  Problem # 3:  HYPERTENSION (ICD-401.9)  His updated medication list for this problem includes:    Amlodipine Besylate 2.5 Mg Tabs (Amlodipine besylate) .Marland Kitchen... 1po once daily  BP today: 110/64 Prior  BP: 128/62 (10/04/2009)  Labs Reviewed: K+: 4.6 (08/24/2009) Creat: : 1.5 (08/24/2009)   Chol: 167 (08/24/2009)   HDL: 32.80 (08/24/2009)   LDL: 103 (08/24/2009)   TG: 157.0 (08/24/2009) stable overall by hx and exam, ok to continue meds/tx as is   Complete Medication List: 1)  Indomethacin 50 Mg Caps (Indomethacin) .Marland Kitchen.. 1 by mouth three times a day as needed gout attack 2)  Amlodipine Besylate 2.5 Mg Tabs (Amlodipine besylate) .Marland Kitchen.. 1po once daily 3)  Aspir-low 81 Mg Tbec (Aspirin) .Marland Kitchen.. 1 by mouth once daily 4)  Viagra 100 Mg Tabs (Sildenafil citrate) .Marland Kitchen.. 1po once daily as needed 5)  Allopurinol 100 Mg Tabs (Allopurinol) .Marland Kitchen.. 1po once daily 6)  Pravachol 20  Mg Tabs (Pravastatin sodium) .Marland Kitchen.. 1po once daily 7)  Bactrim Ds 800-160 Mg Tabs (Sulfamethoxazole-trimethoprim) .Marland Kitchen.. 1 by mouth two times a day  Patient Instructions: 1)  Please take all new medications as prescribed - [facility] 2)  Continue all previous medications as before this visit  3)  Please schedule a follow-up appointment in 9 months with CPX labs  (or sooner if needed) 4)  call for referral to surgury if the abscess is not improved in 1-2 days, or if you get fever, chiills Prescriptions: BACTRIM DS 800-160 MG TABS (SULFAMETHOXAZOLE-TRIMETHOPRIM) 1 by mouth two times a day  #20 x 0   Entered and Authorized by:   [name] MD   Signed by:   [name] MD on 11/27/2009   Method used:   [facility]  [address] [phone]* (retail)       [address]       [place], [place]  [id number]       Ph: [phone] or [phone]       Fax: [phone]   RxID:   [id number]    Orders Added: 1)  Est. Patient Level IV [02542]

## 2010-02-19 NOTE — Progress Notes (Signed)
Summary: medication request  Phone Note Call from Patient   [name]: Patient Call For: [name] MD Summary of Call: Patient called to check to see if his Viagra was in and it has not arrived as of yet. He would like Levitra sent to [place] [facility] [address]. Initial call taken by: Robin Ewing CMA Duncan Dull),  November 02, 2009 10:21 AM  Follow-up for Phone Call        done per emr Follow-up by: [name] MD,  November 02, 2009 10:29 AM    New/Updated Medications: LEVITRA 20 MG TABS (VARDENAFIL HCL) 1po once daily as needed Prescriptions: LEVITRA 20 MG TABS (VARDENAFIL HCL) 1po once daily as needed  #5 x 11   Entered and Authorized by:   [name] MD   Signed by:   [name] MD on 11/02/2009   Method used:   Electronically to        [facility] [address] (retail)       [address]       [place], [place]  [address]       Ph: [phone]       Fax: [phone]   RxID:   [id number]   Appended Document: medication request Patient called and informed prescription he requested has been sent in.

## 2010-02-19 NOTE — Miscellaneous (Signed)
Summary: Orders Update  Clinical Lists Changes  Problems: Added new problem of CAROTID ARTERY DISEASE (ICD-433.10) Orders: Added new Test order of Carotid Duplex (Carotid Duplex) - Signed 

## 2010-02-19 NOTE — Assessment & Plan Note (Signed)
Summary: NEW MEDICARE PT  PKG  #  STC   Vital Signs:  Patient profile:   70 year old male Height:      73.5 inches Weight:      180.25 pounds BMI:     23.54 O2 Sat:      97 % on Room air Temp:     97.4 degrees F oral Pulse rate:   78 / minute BP sitting:   114 / 80  (left arm) Cuff size:   regular  Vitals Entered By: Zella Ball Ewing CMA Duncan Dull) (August 24, 2009 1:11 PM)  O2 Flow:  Room air  Preventive Care Screening  Colonoscopy:    Date:  01/20/2005    Next Due:  01/2010    Results:  normal   Last Pneumovax:    Date:  08/20/2008    Results:  Historical   Last Tetanus Booster:    Date:  08/21/2006    Results:  Historical   CC: New Patient, New Medicare/RE   CC:  New Patient and New Medicare/RE.  History of Present Illness: here to establish - has recent fall getting out of the pool to the right wrist and shoulder- films with no acute, but knee DJD;  and now being treated for right wrist gout as well (Dr Mina Marble);  Pt denies CP, sob, doe, wheezing, orthopnea, pnd, worsening LE edema, palps, dizziness or syncope  Pt denies new neuro symptoms such as headache, facial or extremity weakness  Just started HCTZ a few wks ago per the [facility].  No fever, wt loss, night sweats, loss of appetite or other constitutional symptoms   Preventive Screening-Counseling & Management  Alcohol-Tobacco     Smoking Status: quit      Drug Use:  no.    Problems Prior to Update: None  Medications Prior to Update: 1)  None  Current Medications (verified): 1)  Indomethacin 50 Mg Caps (Indomethacin) .Marland Kitchen.. 1 By Mouth Every 8 Hours For 2 Weeks 2)  Amlodipine Besylate 2.5 Mg Tabs (Amlodipine Besylate) .Marland Kitchen.. 1po Once Daily 3)  Aspir-Low 81 Mg Tbec (Aspirin) .Marland Kitchen.. 1 By Mouth Once Daily  Allergies (verified): No Known Drug Allergies  Past History:  Family History: Last updated: 08/24/2009 alcoholism and arthritis - parent  Social History: Last updated: 08/24/2009 lives with  fiance 10 children Former Smoker Alcohol use-no retired - former Airline pilot Drug use-no  Risk Factors: Smoking Status: quit (08/24/2009)  Past Medical History: Peripheral vascular disease > 50% LICA  - aug 2010 Coronary artery disease - nonobstructive - cath feb 2010 Hyperlipidemia Hypertension COPD/chronic bronchitis Hepatitis C/hx of narcotic polysubstance abuse - heroin, cocaine, and other - none since 1991 Sickle Cell trait hx of syphilis - treated Renal insufficiency MRSA - abscess  - Feb 2011 hx of ETOH abuse  Gout MD Roster:      Hand ortho - Dr Mina Marble E.D.  Past Surgical History: s/p knee surgury - arthroscopic on the right - Dr Madelon Lips  Family History: Reviewed history and no changes required. alcoholism and arthritis - parent  Social History: Reviewed history and no changes required. lives with fiance 10 children Former Smoker Alcohol use-no retired - former Airline pilot Drug use-no Smoking Status:  quit Drug Use:  no  Review of Systems  The patient denies anorexia, fever, vision loss, decreased hearing, hoarseness, chest pain, syncope, dyspnea on exertion, peripheral edema, prolonged cough, headaches, hemoptysis, abdominal pain, melena, hematochezia, severe indigestion/heartburn, hematuria, muscle weakness, suspicious skin lesions, transient  blindness, difficulty walking, depression, unusual weight change, abnormal bleeding, enlarged lymph nodes, angioedema, and breast masses.         all otherwise negative per pt -    Physical Exam  General:  alert and underweight appearing.   Head:  normocephalic and atraumatic.   Eyes:  vision grossly intact, pupils equal, and pupils round.   Ears:  R ear normal and L ear normal.   Nose:  no external deformity and no nasal discharge.   Mouth:  no gingival abnormalities and pharynx pink and moist.   Neck:  supple and no masses.   Lungs:  normal respiratory effort and normal breath sounds.    Heart:  normal rate and regular rhythm.   Abdomen:  soft, non-tender, and normal bowel sounds.   Msk:  no joint tenderness and no joint swelling.  except for 2+ right wrist and hand sweling Extremities:  no edema, no erythema  Neurologic:  cranial nerves II-XII intact and strength normal in all extremities.   Skin:  color normal and no rashes.   Psych:  not anxious appearing and not depressed appearing.     Impression & Recommendations:  Problem # 1:  Preventive Health Care (ICD-V70.0)  Overall doing well, age appropriate education and counseling updated and referral for appropriate preventive services done unless declined, immunizations up to date or declined, diet counseling done if overweight, urged to quit smoking if smokes , most recent labs reviewed and current ordered if appropriate, ecg reviewed or declined (interpretation per ECG scanned in the EMR if done); information regarding Medicare Prevention requirements given if appropriate; speciality referrals updated as appropriate   Orders: EKG w/ Interpretation (93000) TLB-BMP (Basic Metabolic Panel-BMET) (80048-METABOL) TLB-CBC Platelet - w/Differential (85025-CBCD) TLB-Hepatic/Liver Function Pnl (80076-HEPATIC) TLB-Lipid Panel (80061-LIPID) TLB-TSH (Thyroid Stimulating Hormone) (84443-TSH) TLB-PSA (Prostate Specific Antigen) (84153-PSA) TLB-Udip ONLY (81003-UDIP)  Problem # 2:  PERIPHERAL VASCULAR DISEASE (ICD-443.9)  due for f/.u carotid - will order  Orders: Radiology Referral (Radiology)  Problem # 3:  HYPERTENSION (ICD-401.9)  His updated medication list for this problem includes:    Amlodipine Besylate 2.5 Mg Tabs (Amlodipine besylate) .Marland Kitchen... 1po once daily to stop the hctz in light of the probabe gout; check uric acid, and treat as above, f/u any worsening signs or symptoms   BP today: 114/80  Complete Medication List: 1)  Indomethacin 50 Mg Caps (Indomethacin) .Marland Kitchen.. 1 by mouth every 8 hours for 2 weeks 2)   Amlodipine Besylate 2.5 Mg Tabs (Amlodipine besylate) .Marland Kitchen.. 1po once daily 3)  Aspir-low 81 Mg Tbec (Aspirin) .Marland Kitchen.. 1 by mouth once daily  Other Orders: TLB-Uric Acid, Blood (84550-URIC)  Patient Instructions: 1)  Take an Aspirin every day - 81 mg - 1 per day - COATED only 2)  You will be contacted about the referral(s) to: carotid dopplers 3)  Please go to the Lab in the basement for your blood and/or urine tests today  4)  Please call the number on the [facility] Card for results of your testing  5)  Your EKG was OK today 6)  stop the fluid pill 7)  start the amlodipine 2.5 mg - 1 per day 8)  Continue all other previous medications as before this visit  9)  Please schedule a follow-up appointment in 3 months. Prescriptions: AMLODIPINE BESYLATE 2.5 MG TABS (AMLODIPINE BESYLATE) 1po once daily  #90 x 3   Entered and Authorized by:   [name] MD   Signed by:   Len Blalock  John MD on 08/24/2009   Method used:   Electronically to        [facility]  [address] [phone]* (retail)       [address]       [place], [place]  [id number]       Ph: [phone] or [phone]       Fax: [phone]   RxID:   [id number]    Immunization History:  Tetanus/Td Immunization History:    Tetanus/Td:  historical (08/21/2006)  Pneumovax Immunization History:    Pneumovax:  historical (08/20/2008)

## 2010-02-19 NOTE — Progress Notes (Signed)
  Phone Note Outgoing Call   Call placed by: Robin Call placed to: Patient Summary of Call: Called to infom pt. to pickup his Viagra received from patient assitance. Patient agreed to pickup at front desk. Initial call taken by: Robin Ewing CMA Duncan Dull),  September 06, 2009 4:12 PM

## 2010-02-19 NOTE — Progress Notes (Signed)
----   Converted from flag ---- ---- 08/24/2009 2:43 PM, Edman Circle wrote: appt 8/12 @ 1:00  ---- 08/24/2009 1:52 PM, Dagoberto Reef wrote: Thanks  ---- 08/24/2009 1:33 PM, [name] MD wrote: The following orders have been entered for this patient and placed on Admin Hold:  Type:     Referral       Code:   Radiology Description:   Radiology Referral Order Date:   08/24/2009   Authorized By:   [name] MD Order #:   [id number] Clinical Notes:   Name of Test or Procedure: carotid artery dopplers  Of What:  Special Instructions ------------------------------

## 2010-02-21 NOTE — Progress Notes (Signed)
Summary: RX REFILL  Phone Note Call from Patient Call back at [phone]   [name]: Patient Call For: [name] MD Summary of Call: Pt is requesting refill for VIAGRA 100MG  to be send to [facility] and this is the phone number for [facility] [phone]. Initial call taken by: Livingston Diones,  January 08, 2010 2:30 PM  Follow-up for Phone Call        ok - routine to robin Follow-up by: [name] MD,  January 08, 2010 2:58 PM  Additional Follow-up for Phone Call Additional follow up Details #1::        called pt informed prescription requested has been sent to [facility] and when it arrives at our office will call to pickup. Additional Follow-up by: Robin Ewing CMA Duncan Dull),  January 08, 2010 4:01 PM    Prescriptions: VIAGRA 100 MG TABS (SILDENAFIL CITRATE) 1po once daily as needed  #30 x 0   Entered by:   Scharlene Gloss CMA (AAMA)   Authorized by:   [name] MD   Signed by:   Scharlene Gloss CMA (AAMA) on 01/08/2010   Method used:   Printed then faxed to ...       [facility]  [address] [phone][facility] (retail)       [address]       [place], [place]  [address]       Ph: [phone] or [phone]       Fax: [phone]   RxID:   [id number]

## 2010-02-21 NOTE — Progress Notes (Signed)
Summary: Referral  Phone Note Call from Patient   [name]: Patient [phone] Summary of Call: Pt called requesting referral to Ortho for knee pain Initial call taken by: Margaret Pyle, CMA,  February 14, 2010 1:49 PM  Follow-up for Phone Call        ok - will do Follow-up by: [name] MD,  February 14, 2010 2:01 PM  Additional Follow-up for Phone Call Additional follow up Details #1::        Pt informed, will expect call from [facility] with appt info Additional Follow-up by: Margaret Pyle, CMA,  February 15, 2010 8:17 AM  New Problems: KNEE PAIN (ICD-719.46)   New Problems: KNEE PAIN (ICD-719.46)

## 2010-02-21 NOTE — Progress Notes (Signed)
Summary: called pt to pick up Rx pap.  Phone Note Outgoing Call   Call placed by: Daphane Shepherd,  January 30, 2010 8:34 AM Call placed to: Patient Action Taken: Assistance medications ready for pick up Summary of Call: Called patient to pick up Rx for Viagra. No answer. Initial call taken by: Daphane Shepherd,  January 30, 2010 8:34 AM  Follow-up for Phone Call        Called patient and informed him that his Rx is ready for pick up. Follow-up by: Daphane Shepherd,  January 30, 2010 1:07 PM

## 2010-03-19 ENCOUNTER — Telehealth: Payer: Self-pay | Admitting: Internal Medicine

## 2010-03-28 NOTE — Progress Notes (Signed)
Summary: PT asst reorder  Phone Note Call from Patient   [name]: Patient [phone] Summary of Call: Pt called requesting Rx for Viagra 100mg  be reordered through [facility] program. Rx re-order, pt notified via VM.  Order # [id number] Initial call taken by: Margaret Pyle, CMA,  March 19, 2010 1:37 PM

## 2010-04-02 ENCOUNTER — Telehealth: Payer: Self-pay | Admitting: Internal Medicine

## 2010-04-09 NOTE — Progress Notes (Signed)
  Phone Note Outgoing Call   Call placed by: Robin Call placed to: Patient Summary of Call: Called pt. informed Viagra from patient assistance has arrived and is ready for pickup. Initial call taken by: Robin Ewing CMA Duncan Dull),  April 02, 2010 2:14 PM

## 2010-04-12 LAB — POCT URINALYSIS DIP (DEVICE)
Glucose, UA: NEGATIVE mg/dL
Nitrite: NEGATIVE
Protein, ur: NEGATIVE mg/dL
Specific Gravity, Urine: 1.015 (ref 1.005–1.030)
Urobilinogen, UA: 4 mg/dL — ABNORMAL HIGH (ref 0.0–1.0)
pH: 5.5 (ref 5.0–8.0)

## 2010-04-12 LAB — URINALYSIS, ROUTINE W REFLEX MICROSCOPIC
Bilirubin Urine: NEGATIVE
Glucose, UA: NEGATIVE mg/dL
Hgb urine dipstick: NEGATIVE
Ketones, ur: NEGATIVE mg/dL
Protein, ur: NEGATIVE mg/dL
Urobilinogen, UA: 1 mg/dL (ref 0.0–1.0)

## 2010-04-12 LAB — CULTURE, ROUTINE-ABSCESS

## 2010-04-29 LAB — CK TOTAL AND CKMB (NOT AT ARMC)
CK, MB: 1.8 ng/mL (ref 0.3–4.0)
CK, MB: 1.9 ng/mL (ref 0.3–4.0)
Relative Index: 1.3 (ref 0.0–2.5)

## 2010-04-29 LAB — URINALYSIS, ROUTINE W REFLEX MICROSCOPIC
Glucose, UA: NEGATIVE mg/dL
Hgb urine dipstick: NEGATIVE
Ketones, ur: NEGATIVE mg/dL
Protein, ur: NEGATIVE mg/dL
pH: 5.5 (ref 5.0–8.0)

## 2010-04-29 LAB — DIFFERENTIAL
Basophils Absolute: 0 10*3/uL (ref 0.0–0.1)
Basophils Relative: 0 % (ref 0–1)
Eosinophils Absolute: 0 10*3/uL (ref 0.0–0.7)
Neutro Abs: 4.3 10*3/uL (ref 1.7–7.7)
Neutrophils Relative %: 66 % (ref 43–77)

## 2010-04-29 LAB — CBC
HCT: 38 % — ABNORMAL LOW (ref 39.0–52.0)
MCHC: 33.5 g/dL (ref 30.0–36.0)
MCHC: 33.8 g/dL (ref 30.0–36.0)
MCV: 91.8 fL (ref 78.0–100.0)
MCV: 92.4 fL (ref 78.0–100.0)
Platelets: 261 10*3/uL (ref 150–400)
Platelets: 271 10*3/uL (ref 150–400)
RBC: 4.12 MIL/uL — ABNORMAL LOW (ref 4.22–5.81)
WBC: 6.1 10*3/uL (ref 4.0–10.5)

## 2010-04-29 LAB — BASIC METABOLIC PANEL
BUN: 25 mg/dL — ABNORMAL HIGH (ref 6–23)
BUN: 33 mg/dL — ABNORMAL HIGH (ref 6–23)
CO2: 21 mEq/L (ref 19–32)
CO2: 23 mEq/L (ref 19–32)
CO2: 24 mEq/L (ref 19–32)
Calcium: 8.6 mg/dL (ref 8.4–10.5)
Calcium: 9.4 mg/dL (ref 8.4–10.5)
Chloride: 104 mEq/L (ref 96–112)
Chloride: 106 mEq/L (ref 96–112)
Chloride: 108 mEq/L (ref 96–112)
Creatinine, Ser: 1.73 mg/dL — ABNORMAL HIGH (ref 0.4–1.5)
Creatinine, Ser: 2.24 mg/dL — ABNORMAL HIGH (ref 0.4–1.5)
GFR calc Af Amer: 35 mL/min — ABNORMAL LOW (ref >60)
Glucose, Bld: 98 mg/dL (ref 70–99)
Potassium: 4.4 mEq/L (ref 3.5–5.1)

## 2010-04-29 LAB — RAPID URINE DRUG SCREEN, HOSP PERFORMED
Amphetamines: NOT DETECTED
Barbiturates: NOT DETECTED
Benzodiazepines: NOT DETECTED
Cocaine: NOT DETECTED

## 2010-04-29 LAB — CULTURE, BLOOD (ROUTINE X 2): Culture: NO GROWTH

## 2010-04-29 LAB — TROPONIN I
Troponin I: 0.01 ng/mL (ref 0.00–0.06)
Troponin I: 0.01 ng/mL (ref 0.00–0.06)

## 2010-04-29 LAB — POCT I-STAT, CHEM 8
BUN: 38 mg/dL — ABNORMAL HIGH (ref 6–23)
Calcium, Ion: 1.18 mmol/L (ref 1.12–1.32)
Creatinine, Ser: 2.4 mg/dL — ABNORMAL HIGH (ref 0.4–1.5)
Glucose, Bld: 89 mg/dL (ref 70–99)
TCO2: 22 mmol/L (ref 0–100)

## 2010-04-29 LAB — ETHANOL: Alcohol, Ethyl (B): <5 mg/dL (ref 0–10)

## 2010-04-29 LAB — POTASSIUM: Potassium: 4.6 mEq/L (ref 3.5–5.1)

## 2010-04-29 LAB — POCT CARDIAC MARKERS
CKMB, poc: 2.6 ng/mL (ref 1.0–8.0)
Myoglobin, poc: 241 ng/mL (ref 12–200)

## 2010-05-07 LAB — BASIC METABOLIC PANEL
BUN: 14 mg/dL (ref 6–23)
BUN: 16 mg/dL (ref 6–23)
CO2: 28 mEq/L (ref 19–32)
Calcium: 8 mg/dL — ABNORMAL LOW (ref 8.4–10.5)
Calcium: 8.6 mg/dL (ref 8.4–10.5)
Calcium: 9.5 mg/dL (ref 8.4–10.5)
Chloride: 103 mEq/L (ref 96–112)
Chloride: 104 mEq/L (ref 96–112)
Chloride: 105 mEq/L (ref 96–112)
Creatinine, Ser: 1.16 mg/dL (ref 0.4–1.5)
Creatinine, Ser: 1.56 mg/dL — ABNORMAL HIGH (ref 0.4–1.5)
GFR calc Af Amer: 54 mL/min — ABNORMAL LOW (ref >60)
GFR calc Af Amer: 57 mL/min — ABNORMAL LOW (ref >60)
GFR calc Af Amer: >60 mL/min (ref >60)
GFR calc non Af Amer: 53 mL/min — ABNORMAL LOW (ref >60)
GFR calc non Af Amer: >60 mL/min (ref >60)
Glucose, Bld: 91 mg/dL (ref 70–99)
Potassium: 3.9 mEq/L (ref 3.5–5.1)
Sodium: 139 mEq/L (ref 135–145)
Sodium: 139 mEq/L (ref 135–145)
Sodium: 139 mEq/L (ref 135–145)

## 2010-05-07 LAB — CBC
HCT: 38.1 % — ABNORMAL LOW (ref 39.0–52.0)
Hemoglobin: 13.1 g/dL (ref 13.0–17.0)
MCHC: 34.5 g/dL (ref 30.0–36.0)
Platelets: 219 10*3/uL (ref 150–400)
RBC: 4.83 MIL/uL (ref 4.22–5.81)
RDW: 12.2 % (ref 11.5–15.5)
WBC: 5.3 10*3/uL (ref 4.0–10.5)

## 2010-05-07 LAB — HEPATIC FUNCTION PANEL
Bilirubin, Direct: 0.2 mg/dL (ref 0.0–0.3)
Indirect Bilirubin: 0.5 mg/dL (ref 0.3–0.9)
Total Bilirubin: 0.7 mg/dL (ref 0.3–1.2)

## 2010-05-07 LAB — CARDIAC PANEL(CRET KIN+CKTOT+MB+TROPI)
CK, MB: 0.7 ng/mL (ref 0.3–4.0)
Relative Index: INVALID (ref 0.0–2.5)
Total CK: 83 U/L (ref 7–232)
Troponin I: 0.01 ng/mL (ref 0.00–0.06)
Troponin I: 0.01 ng/mL (ref 0.00–0.06)

## 2010-05-07 LAB — DIFFERENTIAL
Eosinophils Absolute: 0.1 10*3/uL (ref 0.0–0.7)
Lymphocytes Relative: 41 % (ref 12–46)
Lymphs Abs: 2.1 10*3/uL (ref 0.7–4.0)
Monocytes Relative: 12 % (ref 3–12)
Neutro Abs: 2.4 10*3/uL (ref 1.7–7.7)
Neutrophils Relative %: 45 % (ref 43–77)

## 2010-05-07 LAB — CK TOTAL AND CKMB (NOT AT ARMC)
Relative Index: INVALID (ref 0.0–2.5)
Total CK: 73 U/L (ref 7–232)

## 2010-05-07 LAB — LIPID PANEL
Cholesterol: 144 mg/dL (ref 0–200)
HDL: 23 mg/dL — ABNORMAL LOW (ref >39)
Total CHOL/HDL Ratio: 6.3 RATIO
VLDL: 20 mg/dL (ref 0–40)

## 2010-05-07 LAB — RAPID URINE DRUG SCREEN, HOSP PERFORMED
Barbiturates: NOT DETECTED
Cocaine: NOT DETECTED

## 2010-05-07 LAB — POCT CARDIAC MARKERS
CKMB, poc: <1 ng/mL — ABNORMAL LOW (ref 1.0–8.0)
Myoglobin, poc: 58.8 ng/mL (ref 12–200)
Troponin i, poc: <0.05 ng/mL (ref 0.00–0.09)

## 2010-05-07 LAB — RPR: RPR Ser Ql: NONREACTIVE

## 2010-05-07 LAB — TSH: TSH: 1.141 u[IU]/mL (ref 0.350–4.500)

## 2010-05-07 LAB — APTT: aPTT: 27 seconds (ref 24–37)

## 2010-05-11 ENCOUNTER — Inpatient Hospital Stay (INDEPENDENT_AMBULATORY_CARE_PROVIDER_SITE_OTHER)
Admission: RE | Admit: 2010-05-11 | Discharge: 2010-05-11 | Disposition: A | Discharge status: Home / Self Care | Payer: 59 | Source: Ambulatory Visit | Attending: Family Medicine | Admitting: Family Medicine

## 2010-05-11 DIAGNOSIS — S058X9A Other injuries of unspecified eye and orbit, initial encounter: Secondary | ICD-10-CM

## 2010-05-30 ENCOUNTER — Telehealth: Payer: Self-pay

## 2010-05-30 NOTE — Telephone Encounter (Signed)
Pt called requesting re-order of Viagra through [facility]. Order placed (#[id number]) pt aware. 5-7 days for delivery.

## 2010-06-04 NOTE — Cardiovascular Report (Signed)
NAME:  Alan Franco, Alan Franco              ACCOUNT NO.:  [id number]   MEDICAL RECORD NO.:  [id number]          PATIENT TYPE:  INP   LOCATION:  4711                         FACILITY:  [facility]   PHYSICIAN:  Spano A. Alanda Amass, M.D.DATE OF BIRTH:  11/24/1940   DATE OF PROCEDURE:  03/02/2008  DATE OF DISCHARGE:                            CARDIAC CATHETERIZATION   PROCEDURES:  Retrograde central aortic catheterization, selective  coronary angiography by Judkins technique, left ventricular angiogram in  right anterior oblique and left anterior oblique projection, aortic root  angiogram left anterior oblique projection, abdominal aortic angiogram  midstream posteroanterior projection, right common femoral artery  closure successfully using 6-French Angio-Seal device.   PROCEDURE:  The patient was brought to the second floor CP Lab in  postabsorptive state with 5 mg Valium p.o. premedication and informed  consent was obtained to proceed with diagnostic catheterization.  The  right groin was prepped and draped in usual manner.  Xylocaine 1% was  used for local anesthesia and RCFA was entered with single anterior  puncture using an 18 thin-wall needle.  A 6-French short arterial side-  arm sheaths were inserted without difficulty using modified Seldinger  technique and guidewire catheter exchange was used throughout the  procedure.  Selective coronary angiography was done with 6-French, 4-cm  taper, preformed Cordis coronary and pigtail catheters.  LV angiogram  was done in the RAO and LAO projection at less than 25 mL, 14 mL per  second and 20 mL, 12 mL per second respectively.  Pullback pressures and  CA showed no gradient.  Because of the patient's history of  hypertension, chest pain, and a remote history of primary lues treated  remotely while in the service, it was felt best to do an aortic root  angiography.  A hand injection of the right common femoral showed good  puncture, but there was some  mild atherosclerosis around this above the  SFA-profunda bifurcation where the puncture was.  The Angio-Seal device  was deployed using standard technique and was successful in arterial  closure.  The patient was transferred to the holding area in stable  condition.  He had received 2 mg of Versed for sedation during the  procedure.  He tolerated the procedure well.   PRESSURES:  LV:  170/0; LVEDP 16 mmHg.   CA:  170/90 mmHg.   There was no gradient across the aortic valve on catheter pullback.   Fluoroscopy was done revealing significant coronary, aortic or valvular  calcification.   LV angiogram in the RAO and LAO projection showed a vigorously  contracting LV with EF greater than 60%.  No segmental wall motion  abnormality.  No mitral regurgitation.  There was at least moderate or  greater eccentric LVH present angiographically.   Aortic root projection showed a large aortic root but no evidence of  dissection and no evidence of aneurysm formation.  There was no aortic  regurgitation present.   Abdominal aortic angiogram in the midstream PA projection showed single  normal renal arteries bilaterally, normal proximal SMA and celiac and  IMA.  The infrarenal abdominal aorta appeared normal.  The aortoiliac  junction was normal.  There was a mild atherosclerotic nonobstructive  disease at both common iliacs.  The hypogastric and the external iliacs  were tortuous, but no significant stenosis.   CORONARY ANGIOGRAPHY:  The main left coronary artery was normal.   The left anterior descending had a smooth 30-40% narrowing at the  junction of the proximal and mid-third of the vessel between 2 small  diagonal branches.  Remainder of the LAD was widely patent towards the  apex of the heart, bifurcated was a large vessel with no significant  stenosis and normal flow.   The large DX-1 bifurcated was moderately thin but relatively large and  no significant stenosis.  There were 4  small diagonals from the mid to  the distal third of the LAD that appeared normal.   The circumflex was nondominant and comprised of a large-to-moderate size  OM1 that was normal and a small AV groove that was normal.   The right coronary was a dominant vessel.  There was 30-40% irregularity  and mild concentric narrowing at the junction of the proximal mid third  of the RCA.  There was another 30% narrowing at the proximal portion of  the PDA.  The remainder of the vessel was large and had no significant  stenosis and there was a large __________ bifurcating PLA and a large  PDA branch.   DISCUSSION:  A 70 year old African American, white, divorced, father of  57 with over 30 grandchildren living with his girlfriend for over 30  years.  He quit smoking 5 years ago.  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  His past history is  substance abuse with none in almost 15-20 years, but does have a history  of hepatitis C, that currently has been stable.  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  He has mild renal insufficiency with a creatinine of 1.5 in  the past and a creatinine today of 1.16.  He has had episodic chest pain  over the last several years.  Fourteen months ago, he had a negative  exercise test.  He was admitted by [facility] today for recurrent chest  pain, of 1-2 days' duration, that was substernal pressure of moderate  degree and some short chest pain that was nonpleuritic, however.  It was  felt best because of past episodes of chest pain with his history to  proceed with diagnostic angiography.  Importantly, the patient has mild  nonobstructive coronary disease, moderate-to-severe hypertension with  LVH and normal LV function.  From the cardiac standpoint, I would  recommend medical therapy of his  hypertension and hyperlipidemia at  present.  We will get another RPR while he is in the hospital with  medical followup as an outpatient.  Reassured as far as his coronary  status is concerned.   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  He taking business courses at  [employer] now.   CATHETERIZATION DIAGNOSES:  1. Chest pain, etiology undetermined.  2. Possible reflux with esophageal spasm, upper gastrointestinal      etiology.  3. No evidence of aortic perception.  4. Mild nonobstructive coronary artery disease.  5. Left ventricular hypertrophy with vigorous systolic function,      systemic hypertension, and normal single renal arteries  bilaterally.  6. Mild nonobstructive peripheral arterial disease, common iliacs and      right common femoral.  7. Lipids pending.  8. Remote history of primary syphilis, treated in the service with      penicillin.  No known sequelae.  No aortic aneurysm visualized on      this study.      Weimer A. Alanda Amass, M.D.  Electronically Signed     RAW/MEDQ  D:  03/02/2008  T:  03/03/2008  Job:  [id number]   cc:   CP Lab

## 2010-06-04 NOTE — Discharge Summary (Signed)
NAME:  Alan Franco, Alan Franco              ACCOUNT NO.:  [id number]   MEDICAL RECORD NO.:  [id number]          PATIENT TYPE:  INP   LOCATION:  4711                         FACILITY:  [facility]   PHYSICIAN:  Marcellus Scott, MD     DATE OF BIRTH:  16-Feb-1940   DATE OF ADMISSION:  03/02/2008  DATE OF DISCHARGE:  03/05/2008                               DISCHARGE SUMMARY   ADDENDUM:  This is an addendum to the discharge summary that was done by Dr.  Waymon Amato on 02/13/ 2010.   PRIMARY MEDICAL DOCTOR:  Dr. Maryelizabeth Rowan.   His discharge diagnosis is the same.   DISCHARGE MEDICATIONS:  1. Enteric-coated aspirin 81 mg p.o. daily.  2. Crestor 10 mg p.o. at bedtime.  3. Avelox 400 mg p.o. daily to complete total of 5-day course.  4. Albuterol 90 mcg per spray MDI, 2 puffs inhaled q.i.d. p.r.n.  5. Toprol XL 25 mg p.o. daily.  6. Prilosec over-the-counter 20 mg p.o. daily.   PERTINENT LABORATORY DATA:  BUN 12, creatinine 1.48.   HOSPITAL COURSE:  On February 13, the patient was ready for discharge.  However, his creatinine was noted to be elevated to 1.5.  He came in  with normal creatinines prior to his cardiac catheterization.  This may  be a component of some dehydration and contrast secondary to his cardiac  cath, as well as CT of the chest with contrast.  The patient was  hydrated with IV fluids.  He has been essentially asymptomatic.  His  creatinine today is slightly better than yesterday.  I have discussed  his case with Dr. Arlean Hopping, the renal physician on call.  He indicates  that the patient may be discharged and advised plenty of oral liquids.  He has also recommended holding off on ACE inhibitors or ARBs for a  couple of weeks.  The patient hence will be discharged on metoprolol.  He has also been advised to follow up with his primary medical doctor  with a repeat renal panel in 3 days.  Same has been explained to the  patient and his spouse.  He verbalizes understanding.  The patient  is at  this time stable for discharge.      Marcellus Scott, MD  Electronically Signed     AH/MEDQ  D:  03/05/2008  T:  03/05/2008  Job:  [id number]   cc:   Maryelizabeth Rowan, M.D.

## 2010-06-04 NOTE — H&P (Signed)
NAME:  Alan Franco, Alan Franco              ACCOUNT NO.:  [id number]   MEDICAL RECORD NO.:  [id number]          PATIENT TYPE:  INP   LOCATION:  1508                         FACILITY:  [facility]   PHYSICIAN:  Hettie Holstein, D.O.    DATE OF BIRTH:  07/07/1940   DATE OF ADMISSION:  12/30/2006  DATE OF DISCHARGE:                              HISTORY & PHYSICAL   PRIMARY CARE PHYSICIAN:  Unassigned.   CHIEF COMPLAINT:  Chest pain.   HISTORY OF PRESENT ILLNESS:  Mr. Elmore is a very pleasant 70 year old  male with medical history significant for COPD, hepatitis C, history of  peptic ulcer disease.  According to the patient, endoscopy performed  within the past couple years upper and lower.  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   Mr. Zirbes presented with exertional chest pain very vague in  description, no diaphoresis.  He does have associated shortness of  breath.  No left arm radiation.  In any event, this has been on and off  for the past few weeks.  He denies any known history of coronary  disease.   HOME MEDICATIONS:  1. He takes only aspirin a day.  2. He is supposed to be on inhalers, but he states that he is out and      he has not needed these.   ALLERGIES:  NO KNOWN DRUG ALLERGIES.   SOCIAL HISTORY:  He is married.  He no longer smokes or drinks alcohol,  though this was formerly a problem he had in the past.  He is a former  [employer]al architect.   FAMILY HISTORY:  His mother died at age 54 with pneumonia.  Father has  suffered with Alzheimer's.   REVIEW OF SYSTEMS:  He states he experiences bilateral calf pain with  walking a couple of blocks which formerly he had been able to do so  without problems.  He predominantly complains of dyspnea on exertion.  He has had sort of a head cold with congestion.  Otherwise, no fever.  No productive  cough.   PHYSICAL EXAMINATION:  VITAL SIGNS:  Blood pressure 125/92, heart rate  91, respirations 18, O2 saturation 98%.  HEENT:  Reveals head to be normocephalic, atraumatic.  Extraocular  muscles intact.  NECK:  Supple.  No palpable thyromegaly or mass.  CARDIOVASCULAR:  Reveals normal S1-S2.  LUNGS:  Clear bilaterally with normal effort.  No dullness to  percussion.  His breath sounds are diminished.  ABDOMEN:  Soft and nontender.  No rebound or guarding.  LOWER EXTREMITIES:  Reveal no calf tenderness or edema.  His dorsalis  pedis pulses are palpable bilaterally.   LABORATORY DATA:  Sodium 138, potassium 5, BUN 18, creatinine 1.3,  glucose 25, hemoglobin was 14.38, WBC 83.   DIAGNOSTICS:  1. His EKG in the emergency room revealed normal sinus rhythm with      nonspecific T-wave abnormality and his ejection fraction last year      was  60%.  2. D-dimer was mildly elevated and therefore Dr. Elsie Lincoln ordered a CT      scan which was not revealing of acute process or pulmonary emboli.   ASSESSMENT:  1. Atypical chest pain.  2. Emphysema.  3. Chronic hepatitis C.  4. Peptic ulcer disease with a prior history of evaluation, records      not available in E-chart, though the patient states that he had      these done in [place].  5. Hypertension, poorly controlled.   PLAN:  At this time, Mr. Delair will be admitted.  His cardiac markers  will be cycled.  He will be followed under 23-hour observation status.  He certainly should be able to return home if he rules out.  He may need  referral for a new primary care physician as he does not have a primary  care doctor.  We will recommend either Dr. Julio Sicks or Dr. Mikeal Hawthorne.      Hettie Holstein, D.O.  Electronically Signed     ESS/MEDQ  D:  12/30/2006  T:  12/31/2006  Job:  [id number]   cc:   Jackie Plum, M.D.  Fax: [phone]

## 2010-06-04 NOTE — Discharge Summary (Signed)
NAME:  Alan Franco, Alan Franco              ACCOUNT NO.:  [id number]   MEDICAL RECORD NO.:  [id number]          PATIENT TYPE:  OUT   LOCATION:  NUC                          FACILITY:  [facility]   PHYSICIAN:  Altha Harm, MDDATE OF BIRTH:  1940/04/30   DATE OF ADMISSION:  01/01/2007  DATE OF DISCHARGE:  01/02/2007                               DISCHARGE SUMMARY   DISCHARGE DISPOSITION:  Home.   FINAL DISCHARGE DIAGNOSES:  1. Chest pain, noncardiac.  2. History of COPD (chronic obstructive pulmonary disease) acute      symptoms.  3. Claudication.  4. History of hepatitis C.  5. Hypertension poorly controlled.   DISCHARGE MEDICATIONS:  1. Metoprolol 25 mg p.o. b.i.d.  2. Aspirin 81 mg p.o. daily.  3. Albuterol MDI 2 puffs p.o. q.2 h p.r.n.  4. Atrovent MDI 2 puffs p.o. q.6 h p.r.n.   HOSPITAL CONSULTATIONS:  [facility].   PROCEDURES:  Persantine Myoview.   DIAGNOSTIC STUDIES:  1. Chest x-ray 2 view done on admission on December 20 which shows no      active disease.  2. CT angiogram of the chest to rule out emboli.  Impression, no      pulmonary emboli.  Emphysema without acute pulmonary process.  3. Persantine Myoview which shows no evidence of reversible ischemia      or infarction.  Normal wall motion.  Ejection fraction of 58%.   CODE STATUS:  Full code.   ALLERGIES:  No known drug allergies.   CHIEF COMPLAINT:  Chest pain.   HISTORY OF PRESENT ILLNESS:  Please see the H and P dictated by Dr.  Garnette Czech for details of the HPI.   HOSPITAL COURSE:  1. Chest pain.  The patient was ruled out with serial enzymes for      resting ischemia.  The patient underwent a Persantine Myoview under      the guidance of [facility].  This was found to      be negative.  The patient is being prepared for discharge home.  2. Hypertension.  The patient was found to have hypertension with      uncontrolled blood pressures on admission.  The patient  was started      on metoprolol 25 mg p.o. b.i.d.  He is to continue on metoprolol 25      mg p.o. b.i.d. and then for titration as an outpatient.  The      patient states that he will find his own physician as an outpatient      and follow up with him.  3. Claudication.  The patient is placed on aspirin and advised to      follow up as an outpatient for further studies.  4. All other medical problems are stable.  The patient is being      discharged on the above stated medications.      Altha Harm, MD  Electronically Signed     MAM/MEDQ  D:  01/02/2007  T:  01/02/2007  Job:  [id number]   cc:   [facility]

## 2010-06-04 NOTE — H&P (Signed)
NAME:  Alan Franco, Alan Franco NO.:  [id number]   MEDICAL RECORD NO.:  [id number]          PATIENT TYPE:  INP   LOCATION:  6706                         FACILITY:  [facility]   PHYSICIAN:  Alan Cove, MD     DATE OF BIRTH:  1940/10/29   DATE OF ADMISSION:  07/18/2008  DATE OF DISCHARGE:                              HISTORY & PHYSICAL   PRIMARY CARE PHYSICIAN:  Alan Rowan, MD   CHIEF COMPLAINT:  Weakness and dizziness.   Alan Franco is a 70 year old gentleman with multiple medical problems,  presents today with multiple nonspecific complaints.  He states he has  been feeling weak, dizzy, and nauseous since Friday.  He states he was  in his usual state of health and doing well prior to that.  In addition,  he also noticed occasional shortness of breath.  He denies any chest  pain or palpitations.  He denies any abdominal pain, vomiting, diarrhea,  fevers, or chills.  He waited for Monday to come to the urgent care to  be seen for this.  In addition, he also reports that he was started on  Bactrim on Monday, about 10 days ago for boils on both his legs at the  urgent care.  The patient went to the Urgent Care Center today for his  weakness and dizziness and was sent to the emergency department.   PAST MEDICAL HISTORY:  1. Hypertension.  2. Dyslipidemia.  3. Coronary artery disease, mild nonobstructive per cath in February      2010.  4. Chronic kidney disease.  5. History of hep C.  6. Sickle cell trait.  7. COPD.   PAST SURGICAL HISTORY:  Knee surgery.   MEDICATIONS:  Bactrim DS 1 tab b.i.d.   ALLERGIES:  No known drug allergies.   SOCIAL HISTORY:  He lives at home with his Alan Franco, works with a Musician, is a former smoker, quit 5 years ago.  Denies any alcohol.   FAMILY HISTORY:  Significant for coronary disease and sickle cell trait.   REVIEW OF SYSTEMS:  Twelve system reviewed and negative except for HPI.   PHYSICAL EXAMINATION:  VITAL SIGNS:   Temp is 97.7, pulse is 65, blood  pressure 150/80, respirations 20, and sating 100% on 2 L.  Orthostatic  vitals are as follows:  Blood pressure lying down 150/80, heart rate of  69, sitting 123/78 with heart rate of 60, and standing is 108/72 with a  heart rate of 87.  GENERAL:  He is alert, awake, and oriented x3 in no distress.  HEENT:  Pupils equal and reactive to light.  Extraocular movements  intact.  No JVD.  CARDIOVASCULAR:  S1 and S2, regular rate and rhythm.  LUNGS:  Clear to auscultation bilaterally.  ABDOMEN:  Soft, nontender, positive bowel sounds.  EXTREMITIES:  No edema, clubbing, or cyanosis.  There are healing  follicles on both lower extremities.   LABORATORY DATA:  CBC within normal limits.  Chemistry; sodium 135,  potassium 6.3, chloride 107, bicarb 22, BUN 38, creatinine 2.4 up from  1.5.  ICAL is 1.18, CK-MB  is 2.6.  Troponin less than 0.05.  UA is  normal.  EKG is normal sinus rhythm, rate of 60.  Q-waves in V1, V2 and  tall T in V3, V4.  The Q-waves and septal leads are present in the  previous old EKG.  Chest x-ray with no active disease in the chest.  CT  head, no acute abnormalities, chronic ischemic changes.  Renal  ultrasound is normal.   ASSESSMENT AND PLAN:  A 70 year old male with:  1. Acute renal failure and chronic kidney disease, most likely      secondary to Bactrim.  In addition, there is likely a component of      dehydration given that he is significantly orthostatic.  We will      discontinue Bactrim.  We will hydrate with IV fluids, normal saline      100 mL an hour for 2 L and then at 75 an hour.  Monitor I's and O's      chemistries.  2. Hyperkalemia secondary to Bactrim.  EKG with tall T waves in V3,      V4; however, not in all the other leads.  We will repeat a calcium      level in a few hours, give him calcium gluconate, insulin, glucose,      and Kayexalate.  3. Orthostatic hypotension, likely secondary to dehydration and we       will hydrate with IV fluids as stated above.  Also, check cultures      to rule out sepsis and get two more sets of cardiac enzymes.  4. Deep venous thrombosis prophylaxis with Lovenox.  5. Code status.  The patient is a full code.      Alan Cove, MD  Electronically Signed     PJ/MEDQ  D:  07/18/2008  T:  07/19/2008  Job:  [id number]   cc:   Alan Franco, M.D.

## 2010-06-04 NOTE — Discharge Summary (Signed)
NAME:  Alan Franco, Alan Franco              ACCOUNT NO.:  [id number]   MEDICAL RECORD NO.:  [id number]          PATIENT TYPE:  INP   LOCATION:  6706                         FACILITY:  [facility]   PHYSICIAN:  Ruthy Dick, MD    DATE OF BIRTH:  1940/06/29   DATE OF ADMISSION:  07/18/2008  DATE OF DISCHARGE:  07/20/2008                               DISCHARGE SUMMARY   REASON FOR ADMISSION:  Dizziness and weakness.   FINAL DISCHARGE DIAGNOSES:  1. Dizziness and weakness secondary to dehydration and acute renal      insufficiency.  2. Acute renal failure secondary to volume depletion and possibly      Bactrim therapy.  3. Orthostatic hypotension, resolved, again likely secondary to      dehydration.  4. Hyperkalemia, resolved.  5. Nonobstructive coronary artery disease, stable.  6. Hypertension.  7. Dyslipidemia.  8. Chronic obstructive pulmonary disease.  9. Sickle cell trait.  10.History of hepatitis C.   PROCEDURES DONE DURING THIS ADMISSION:  1. Renal ultrasound which was read as being normal with no      hydronephrosis.  2. CT scan of the head without contrast shows no acute intracranial      pathology.  3. Chest x-ray was also normal.   CONSULT DURING THIS ADMISSION:  None.   BRIEF HISTORY OF PRESENT ILLNESS AND HOSPITAL COURSE:  This is a 70-year-  old African American male with multiple medical problems including  hypertension, dyslipidemia, coronary artery disease, nonobstructive,  according to cath in February 2010, chronic kidney disease, hepatitis C,  sickle cell trait, and COPD.  This patient came in because of feeling  weak, dizzy and nauseous.  According to him, he had been having Bactrim  for some furunculosis on his legs and he took the Bactrim for about 9  days.  The furunculi have resolved but then the patient came in with  renal insufficiency.  The other issue here was that the patient works as  a Scientist, forensic for [employer] and said that he had been working under  the  sun and sweating a lot prior to this dizziness and weakness.  In any  case in the hospital, he was found to have a creatinine 2.4 and his  baseline is around 1.45.  Because of this, he was admitted for  rehydration and also working for renal failure.  As noted above, his  weakness and dizziness probably was from his orthostatic hypotension and  dehydration and with volume repletion, the patient seems to be back to  his baseline.  He has no complaints whatsoever today.  No dizziness, no  weakness, no nausea, no vomiting, no diarrhea, or no constipation.  His  creatinine today is 1.73 and this has been improving.  We have  encouraged him at home to drink a lot of fluids orally and to recheck  his BMP in about a week with his primary care physician, Dr. Maryelizabeth Rowan.  Follow up with Dr. Maryelizabeth Rowan within 1 week.  The patient  is to call for this appointment.   DISCHARGE MEDICATION:  1. Aspirin 81  mg p.o. daily over-the-counter.  2. Crestor 10 mg p.o. at bedtime.  3. Toprol-XL 25 mg p.o. daily.   PHYSICAL EXAMINATION:  CHEST:  Clear to auscultation bilaterally.  ABDOMEN:  Soft and nontender.  EXTREMITIES:  No clubbing, no cyanosis, no edema.  Areas of healed  furunculosis noted.  CARDIOVASCULAR:  First and second heart sounds only.  CENTRAL NERVOUS SYSTEM:  Nonfocal.   Time used for discharge planning greater than 30 minutes.  The patient  would be excused from work for a week until he has been seen by Dr.  Duanne Guess, who would then assess on whether he is ready to go back to work.      Ruthy Dick, MD  Electronically Signed     GU/MEDQ  D:  07/20/2008  T:  07/21/2008  Job:  [id number]   cc:   Maryelizabeth Rowan, M.D.

## 2010-06-04 NOTE — Discharge Summary (Signed)
NAME:  Alan Franco, Alan Franco              ACCOUNT NO.:  [id number]   MEDICAL RECORD NO.:  [id number]          PATIENT TYPE:  INP   LOCATION:  4711                         FACILITY:  [facility]   PHYSICIAN:  Alan Scott, MD     DATE OF BIRTH:  Jun 18, 1940   DATE OF ADMISSION:  03/02/2008  DATE OF DISCHARGE:  03/04/2008                               DISCHARGE SUMMARY   PRIMARY CARE PHYSICIAN:  Alan Franco, M.D.   DISCHARGE DIAGNOSES:  1. Pleuritic chest pain, resolved.  2. Mild coronary artery disease per cardiac catheterization.  3. Mild renal insufficiency.  4. Dyslipidemia.  5. Acute on chronic bronchitis.  6. Hypertension.  7. History of hepatitis C   DISCHARGE MEDICATIONS:  1. Enteric coated aspirin 81 mg p.o. daily.  2. Crestor 10 mg p.o. q.h.s.  3. Avelox 400 mg p.o. daily to complete a 5 day course.  4. Avapro 150 mg p.o. daily.  5. Albuterol 90 mcg per spray MDI, 2 puffs inhaled q.i.d. p.r.n.   PROCEDURES:  1. CT angiogram of the chest, impression:  No evidence of acute      abnormality.  No evidence of pulmonary emboli or thoracic aortic      aneurysm or dissection.  Peripheral interlobular septal thickening      suspicious for UIP pulmonary fibrosis, cardiomegaly and mild      paraseptal emphysema.  2. Chest x-ray on February 11, impression:  Previously noted nodular      density on the left corresponds to nipple marker.  3. Chest x-ray also on February 11, impression:  Mild hyperinflation.      Question nodular density over the left lung base which is nipple      shadow.  Recommend repeating PA view with nipple markers.  4. Cardiac catheterization on February 11 by Dr. Alanda Franco.      Diagnosis:  There is mild nonobstructive coronary artery disease,      left ventricular hypertrophy with vigorous systolic function,      systemic hypertension and normal single renal arteries bilaterally,      mild nonobstructive peripheral artery disease, common iliacs and      right  common femoral.   PERTINENT LABORATORY:  Cardiac panel negative.  D-dimer 0.52.  Hepatic  panel only remarkable for AST 46, ALT 43, albumin 3, lipid panel with  HDL 23, LDL 101.  Basic metabolic panel yesterday had a creatinine of  1.34, BUN 14.  CBC:  Hemoglobin 13.1, hematocrit 38.1, white blood cells  4.4, platelets 184,  RPR is nonreactive.  Urine drug screen is positive  for benzodiazepines.  TSH 1.141.   CONSULTATIONS:  1. Cardiology, Dr. Sharin Franco COURSE AND PATIENT DISPOSITION:  Mr. Salts is a 70 year-old  [place] male patient with history of previous smoking,  hypertension, on no medications, sickle cell trait and possible COPD who  presented with chest pain which was pressure like, retrosternal and  nonradiating which seemed to have some musculoskeletal features in that  it got worse with any bend forward.  He was admitted to telemetry for  further evaluation and  management.  His cardiac enzymes were cycled and  negative.  Initially a CT of chest was ordered but was never performed.  The patient was placed on Avelox to treat for possible acute on chronic  bronchitis.  Cardiology was consulted.  They proceded to do coronary  angiogram with findings as above.  They have evaluated today for mild  oozing from the groin catheterization site.  They have cleared him for  discharge.  They recommend statins and ACE inhibitor or ARBs or beta  blocker for blood pressure control and they have cleared him for  discharge.  In in the interim, D-dimer was requested which was positive  and this was followed with a CT angiogram of the chest and pulmonary  embolism was ruled out.  The patient's creatinine was mildly elevated.  Will repeat his basic metabolic panel and he should be able to discharge  once they are okay.  He has currently been chest pain free for more than  24 to 36 hours.  He has been advised to followup with his primary care  physician in one week with  repeat complete metabolic panel especially  focusing on his renal panel and his liver function tests given that he  has been started on statins and past  history of hepatitis C.      Alan Scott, MD  Electronically Signed     AH/MEDQ  D:  03/04/2008  T:  03/04/2008  Job:  [id number]   cc:   Alan Franco, M.D.  Alan Franco, M.D.

## 2010-06-04 NOTE — Procedures (Signed)
CAROTID DUPLEX EXAM   INDICATION:  Left carotid bruit.   HISTORY:  Diabetes:  No.  Cardiac:  No.  Hypertension:  Yes.  Smoking:  The patient is a former smoker.  Previous Surgery:  No.  CV History:  Left carotid bruit.  Amaurosis Fugax No, Paresthesias No, Hemiparesis No                                       RIGHT             LEFT  Brachial systolic pressure:         108               106  Brachial Doppler waveforms:         Triphasic         Triphasic  Vertebral direction of flow:        Antegrade         Antegrade  DUPLEX VELOCITIES (cm/sec)  CCA peak systolic                   87                368/113  ECA peak systolic                   127               93  ICA peak systolic                   92                106  ICA end diastolic                   29                44  PLAQUE MORPHOLOGY:                  Soft              Soft  PLAQUE AMOUNT:                      Mild              Moderate to severe  PLAQUE LOCATION:                    Proximal ICA      Mid CCA   IMPRESSION:  1. 20-39% ICA stenosis bilaterally.  2. Greater than 50% mid left common carotid artery stenosis.       ___________________________________________  V. Charlena Cross, MD   MC/MEDQ  D:  07/27/2008  T:  07/27/2008  Job:  [id number]

## 2010-06-04 NOTE — H&P (Signed)
NAME:  Alan Franco NO.:  [id number]   MEDICAL RECORD NO.:  [id number]          PATIENT TYPE:  INP   LOCATION:  1823                         FACILITY:  [facility]   PHYSICIAN:  Eduard Clos, MDDATE OF BIRTH:  [birth year]   DATE OF ADMISSION:  03/02/2008  DATE OF DISCHARGE:                              HISTORY & PHYSICAL   PRIMARY CARE PHYSICIAN:  Alan Franco, M.D.   CHIEF COMPLAINT:  Chest pain.   HISTORY OF PRESENT ILLNESS:  A 70 year old male with history of  cigarette smoking, quit 5 years ago, history of hypertension on no  medications presently complaining of chest pain.  The patient has been  experiencing chest pain the last 2 days.  The patient states this pain  is pressure-like, retrosternal, non-radiating, only when he moves and  not related to exertion.  The chest pain typically appears whenever he  tries to bend forward.  He denies any associated shortness of breath,  but does have cough with expectoration.  He denies any fevers or chills,  palpitations, dizziness, loss of consciousness, weakness of limbs,  abdominal pain, nausea, vomiting, diarrhea, dysuria, discharges.  The  first set of enzymes in the ER was negative.  The patient has been  admitted for further observation to rule out ACS.  Presently, the  patient is chest pain free.   PAST MEDICAL HISTORY:  1. History of hypertension on medications.  2. History of sickle-cell trait.  3. Possible COPD.   PAST SURGICAL HISTORY:  Right knee surgery.   MEDICATIONS PRIOR TO ADMISSION:  None.   ALLERGIES:  None.   FAMILY HISTORY:  Significant for CAD.   SOCIAL HISTORY:  The patient quit smoking 5 years ago.  He was smoking  for almost 30-40 years.  He denies any alcohol or drug abuse.   REVIEW OF SYSTEMS:  As per the history of present illness.  Nothing else  significant.   PHYSICAL EXAMINATION:  The patient was examined at bedside, not in acute  distress.  VITAL SIGNS:  Blood  pressure is 137/89, pulse 67, temperature 96.6,  respirations 18, O2 SAT 97%.  HEENT:  Anicteric, no pallor.  LUNGS:  Bilateral air entry present, no rhonchi and no crepitation.  HEART:  S1 and S2 heard.  ABDOMEN:  Soft and nontender.  Bowel sounds heard.  CNS:  Awake and oriented times three to person.  Motor function is 5/5.  EXTREMITIES:  Radial pulses felt.  No edema.   LABS:  EKG:  Normal sinus rhythm.  No acute ST-T changes.  Chest x-ray  previously noted a nodular density on the left that corresponds to  nipple marker.  Mild hyperinflation.  CBC:  WBC was 5.3, hemoglobin 13,  hematocrit of 43.2, platelets 219, neutrophils 45%, lymphocytes 41%.  Basic Metabolic Panel:  Sodium 139, potassium was 4.8, chloride 103,  carbon dioxide 29, glucose 83, BUN 15, creatinine 1.1, calcium 9.5.  CK-  MB less than 1, troponin-I  less than 0.05, myoglobin 58.8.   ASSESSMENT:  1. Chest pain.  Rule out acute coronary syndrome.  2. Possible bronchitis.  3.  History of sickle-cell trait.   PLAN:  We will admit the patient to telemetry.  We will cycle cardiac  markers.  We will get a CT chest to rule out any PE.  We already  consulted cardiology and we will follow up their recommendations.  We  will start the patient on Avelox because the patient has been  complaining of cough with expectoration with possible bronchitis.  We  will place on p.r.n. albuterol.  The patient was earlier diagnosed with  hypertension.  Presently, we will observe his blood pressure and further  recommendations as his condition evolves.      Eduard Clos, MD  Electronically Signed     Eduard Clos, MD  Electronically Signed    ANK/MEDQ  D:  03/02/2008  T:  03/02/2008  Job:  [id number]
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
## 2010-06-07 NOTE — Discharge Summary (Signed)
NAME:  Alan Franco, Alan Franco              ACCOUNT NO.:  [id number]   MEDICAL RECORD NO.:  [id number]          PATIENT TYPE:  INP   LOCATION:  5502                         FACILITY:  [facility]   PHYSICIAN:  Hind I Elsaid, MD      DATE OF BIRTH:  [birth year]   DATE OF ADMISSION:  12/09/2005  DATE OF DISCHARGE:  12/11/2005                               DISCHARGE SUMMARY   DISCHARGE DIAGNOSES:  1. Acute chronic obstructive pulmonary disease exacerbation with      bronchitis.  2. History of hepatitis C.  3. Peptic ulcer disease.  4. Hypertension.   DISCHARGE MEDICATIONS:  1. Albuterol nebs q.6 h. p.r.n.  2. Advair 1 puff b.i.d.  3. Aspirin enteric coated 81 mg p.o. daily.  4. Augmentin 500 mg p.o. b.i.d. for 1 week.  5. Metoprolol 25 mg p.o. q.12 h.   PROCEDURES:  1. Chest x-ray on December 09, 2005, no active cardiopulmonary      disease.  2. CT of chest done on December 09, 2005, prominent emphysema, mild      cortical irregularity lateral in the right 10th rib, compatible      with age-indeterminate nondisplaced fracture.  Correlate with point      tenderness in a setting of the __________.  3. Blood culture x2 was negative.  4. Echo was done on December 10, 2005, which showed an ejection      fraction of 60%, left ventricular systolic function was normal.      There was no diagnostic evidence of left ventricular regional wall      motion abnormalities.  Left ventricular wall mildly increased.      Mild mitral valve regurgitation.  Right ventricular size at the      upper limits of normal.   HOSPITAL ADMISSION:  The patient admitted.  He is a 53 African-American  male with no previous medical history.  He had been in regular health.  He developed worsening shortness of breath with exertion.  Also started  with fever and chills at home.  The patient was admitted with a  diagnosis of febrile illness with normal x-ray.   HOSPITAL COURSE:  1. Febrile illness with shortness of breath on  exertion with a history      of a positive smoking history.  The patient was admitted to      telemetry for cardiac monitor.  Once diagnosed acute bronchitis,      most probably due to virus, I started on IV antibiotics and      nebulizer with great response, being on the monitor with no      evidence of any sign of fever.  The patient significantly improved      with nebs and antibiotic.  Diagnosed as COPD exacerbation with      evidence of some active bronchitis which is most probably viral      bronchitis.  2. The patient complained of chest pain during hospitalization, which      seemed to be atypical chest pain.  CK-MB and troponin were run,      which were negative.  EKG which had no evidence of any myocardial      infarction.  A 2D echo does not show any motion abnormalities.  The      patient was given an appointment at [facility] for      possible stress test if the pain continues.  3. Hypertension.  The patient was started on a small dose of      hydrochlorothiazide.   DISPOSITION:  With inpatient, is medically stable for discharge home  with nebs and antibiotics for 1 week to follow with the primary care  physician as an outpatient.  The patient advised if chest pain returns,  continues to bother him, he was advised to go to [facility] for  a possible stress test as an outpatient.      Hind Bosie Helper, MD     HIE/MEDQ  D:  03/06/2006  T:  03/07/2006  Job:  [id number]

## 2010-06-11 ENCOUNTER — Emergency Department (HOSPITAL_COMMUNITY)
Admission: EM | Admit: 2010-06-11 | Discharge: 2010-06-11 | Disposition: A | Discharge status: Home / Self Care | Payer: Medicare PPO | Attending: Emergency Medicine | Admitting: Emergency Medicine

## 2010-06-11 ENCOUNTER — Emergency Department (HOSPITAL_COMMUNITY): Payer: Medicare PPO

## 2010-06-11 DIAGNOSIS — R079 Chest pain, unspecified: Secondary | ICD-10-CM | POA: Insufficient documentation

## 2010-06-11 DIAGNOSIS — I10 Essential (primary) hypertension: Secondary | ICD-10-CM | POA: Insufficient documentation

## 2010-06-11 DIAGNOSIS — IMO0002 Reserved for concepts with insufficient information to code with codable children: Secondary | ICD-10-CM | POA: Insufficient documentation

## 2010-06-11 DIAGNOSIS — M25519 Pain in unspecified shoulder: Secondary | ICD-10-CM | POA: Insufficient documentation

## 2010-06-11 DIAGNOSIS — K219 Gastro-esophageal reflux disease without esophagitis: Secondary | ICD-10-CM | POA: Insufficient documentation

## 2010-06-11 DIAGNOSIS — M542 Cervicalgia: Secondary | ICD-10-CM | POA: Insufficient documentation

## 2010-06-11 LAB — COMPREHENSIVE METABOLIC PANEL
AST: 71 U/L — ABNORMAL HIGH (ref 0–37)
Albumin: 3.6 g/dL (ref 3.5–5.2)
Calcium: 9 mg/dL (ref 8.4–10.5)
Chloride: 100 mEq/L (ref 96–112)
Creatinine, Ser: 1.59 mg/dL — ABNORMAL HIGH (ref 0.4–1.5)
GFR calc Af Amer: 52 mL/min — ABNORMAL LOW (ref >60)
Total Bilirubin: 0.3 mg/dL (ref 0.3–1.2)
Total Protein: 8.1 g/dL (ref 6.0–8.3)

## 2010-06-11 LAB — DIFFERENTIAL
Basophils Relative: 1 % (ref 0–1)
Lymphs Abs: 1.9 10*3/uL (ref 0.7–4.0)
Monocytes Relative: 18 % — ABNORMAL HIGH (ref 3–12)
Neutro Abs: 1.8 10*3/uL (ref 1.7–7.7)
Neutrophils Relative %: 39 % — ABNORMAL LOW (ref 43–77)

## 2010-06-11 LAB — POCT CARDIAC MARKERS
CKMB, poc: 1.5 ng/mL (ref 1.0–8.0)
Troponin i, poc: <0.05 ng/mL (ref 0.00–0.09)
Troponin i, poc: <0.05 ng/mL (ref 0.00–0.09)

## 2010-06-11 LAB — CBC
Hemoglobin: 13.2 g/dL (ref 13.0–17.0)
MCH: 29.5 pg (ref 26.0–34.0)
RBC: 4.47 MIL/uL (ref 4.22–5.81)

## 2010-08-09 ENCOUNTER — Other Ambulatory Visit (INDEPENDENT_AMBULATORY_CARE_PROVIDER_SITE_OTHER): Payer: Medicare PPO

## 2010-08-09 ENCOUNTER — Ambulatory Visit (INDEPENDENT_AMBULATORY_CARE_PROVIDER_SITE_OTHER): Payer: Medicare PPO | Admitting: Internal Medicine

## 2010-08-09 ENCOUNTER — Encounter: Payer: Self-pay | Admitting: Internal Medicine

## 2010-08-09 VITALS — BP 100/68 | HR 96 | Temp 98.3°F | Ht 73.0 in | Wt 184.5 lb

## 2010-08-09 DIAGNOSIS — Z0000 Encounter for general adult medical examination without abnormal findings: Secondary | ICD-10-CM | POA: Insufficient documentation

## 2010-08-09 DIAGNOSIS — Z125 Encounter for screening for malignant neoplasm of prostate: Secondary | ICD-10-CM

## 2010-08-09 DIAGNOSIS — E785 Hyperlipidemia, unspecified: Secondary | ICD-10-CM

## 2010-08-09 DIAGNOSIS — R5383 Other fatigue: Secondary | ICD-10-CM | POA: Insufficient documentation

## 2010-08-09 HISTORY — DX: Encounter for general adult medical examination without abnormal findings: Z00.00

## 2010-08-09 LAB — CBC WITH DIFFERENTIAL/PLATELET
Basophils Relative: 0.4 % (ref 0.0–3.0)
Eosinophils Absolute: 0 10*3/uL (ref 0.0–0.7)
Eosinophils Relative: 0.6 % (ref 0.0–5.0)
Lymphocytes Relative: 38.2 % (ref 12.0–46.0)
MCHC: 33.5 g/dL (ref 30.0–36.0)
Neutrophils Relative %: 49.8 % (ref 43.0–77.0)
Platelets: 195 10*3/uL (ref 150.0–400.0)
RBC: 4.46 Mil/uL (ref 4.22–5.81)
WBC: 6.1 10*3/uL (ref 4.5–10.5)

## 2010-08-09 LAB — URINALYSIS, ROUTINE W REFLEX MICROSCOPIC
Hgb urine dipstick: NEGATIVE
Leukocytes, UA: NEGATIVE
Nitrite: NEGATIVE
Total Protein, Urine: NEGATIVE

## 2010-08-09 LAB — HEPATIC FUNCTION PANEL
ALT: 55 U/L — ABNORMAL HIGH (ref 0–53)
Total Protein: 9.1 g/dL — ABNORMAL HIGH (ref 6.0–8.3)

## 2010-08-09 LAB — BASIC METABOLIC PANEL
BUN: 24 mg/dL — ABNORMAL HIGH (ref 6–23)
CO2: 30 mEq/L (ref 19–32)
Chloride: 101 mEq/L (ref 96–112)
Creatinine, Ser: 1.8 mg/dL — ABNORMAL HIGH (ref 0.4–1.5)
Glucose, Bld: 96 mg/dL (ref 70–99)

## 2010-08-09 LAB — TSH: TSH: 1.78 u[IU]/mL (ref 0.35–5.50)

## 2010-08-09 LAB — LIPID PANEL
Cholesterol: 171 mg/dL (ref 0–200)
Triglycerides: 148 mg/dL (ref 0.0–149.0)

## 2010-08-09 MED ORDER — ALLOPURINOL 100 MG PO TABS: 100.0000 mg | ORAL_TABLET | Freq: Every day | ORAL | Status: DC

## 2010-08-09 MED ORDER — ATORVASTATIN CALCIUM 10 MG PO TABS: 10.0000 mg | ORAL_TABLET | Freq: Every day | ORAL | Status: DC

## 2010-08-09 MED ORDER — SILDENAFIL CITRATE 100 MG PO TABS: 100.0000 mg | ORAL_TABLET | ORAL | Status: DC | PRN

## 2010-08-09 NOTE — Assessment & Plan Note (Signed)
Etiology unclear, Exam otherwise benign, to check labs as documented, follow with expectant management  

## 2010-08-09 NOTE — Progress Notes (Signed)
Subjective:    Patient ID: Alan Franco, male    DOB: 06-Nov-1940, 70 y.o.   MRN: [id number]  HPI  Here for wellness and f/u;  Overall doing ok;  Pt denies CP, worsening SOB, DOE, wheezing, orthopnea, PND, worsening LE edema, palpitations, dizziness or syncope.  Pt denies neurological change such as new Headache, facial or extremity weakness.  Pt denies polydipsia, polyuria, or low sugar symptoms. Pt states overall good compliance with treatment and medications, good tolerability, and trying to follow lower cholesterol diet.  Pt denies worsening depressive symptoms, suicidal ideation or panic. No fever, wt loss, night sweats, loss of appetite, or other constitutional symptoms.  Pt states good ability with ADL's, low fall risk, home safety reviewed and adequate, no significant changes in hearing or vision, and occasionally active with exercise. Not taking ASA as he has hx of PUD.  Needs med refills Past Medical History  Diagnosis Date  . ALCOHOL ABUSE 08/24/2009  . CAROTID ARTERY DISEASE 08/30/2009  . CELLULITIS AND ABSCESS OF LEG EXCEPT FOOT 11/27/2009  . COPD 08/24/2009  . CORONARY ARTERY DISEASE 08/24/2009  . 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 GOUT 08/24/2009  . HEPATITIS C 08/24/2009  . HYPERLIPIDEMIA 08/24/2009  . HYPERTENSION 08/24/2009  . KNEE PAIN 02/14/2010  . MRSA 08/24/2009  . PERIPHERAL VASCULAR DISEASE 08/24/2009  . Preventative health care 08/09/2010  . RENAL INSUFFICIENCY 08/24/2009  . SICKLE CELL TRAIT 08/24/2009   Past Surgical History  Procedure Date  . Knee surgury     arthoscopic on the right, Dr. Madelon Lips    reports that he has quit smoking. He does not have any smokeless tobacco history on file. He reports that he does not drink alcohol or use illicit drugs. family history includes Alcohol abuse in his father and mother. No Known Allergies No current outpatient prescriptions on file prior to visit.   Review of Systems Review of Systems  Constitutional: Negative for  diaphoresis, activity change, appetite change and unexpected weight change.  HENT: Negative for hearing loss, ear pain, facial swelling, mouth sores and neck stiffness.   Eyes: Negative for pain, redness and visual disturbance.  Respiratory: Negative for shortness of breath and wheezing.   Cardiovascular: Negative for chest pain and palpitations.  Gastrointestinal: Negative for diarrhea, blood in stool, abdominal distention and rectal pain.  Genitourinary: Negative for hematuria, flank pain and decreased urine volume.  Musculoskeletal: Negative for myalgias and joint swelling.  Skin: Negative for color change and wound.  Neurological: Negative for syncope and numbness.  Hematological: Negative for adenopathy.  Psychiatric/Behavioral: Negative for hallucinations, self-injury, decreased concentration and agitation.      Objective:   Physical Exam BP 100/68  Pulse 96  Temp(Src) 98.3 F (36.8 C) (Oral)  Ht 6\' 1"  (1.854 m)  Wt 184 lb 8 oz (83.689 kg)  BMI 24.34 kg/m2  SpO2 95% Physical Exam  VS noted Constitutional: Pt is oriented to person, place, and time. Appears well-developed and well-nourished.  HENT:  Head: Normocephalic and atraumatic.  Right Ear: External ear normal.  Left Ear: External ear normal.  Nose: Nose normal.  Mouth/Throat: Oropharynx is clear and moist.  Eyes: Conjunctivae and EOM are normal. Pupils are equal, round, and reactive to light.  Neck: Normal range of motion. Neck supple. No JVD present. No tracheal deviation present.  Cardiovascular: Normal rate, regular rhythm, normal heart sounds and intact distal pulses.   Pulmonary/Chest: Effort normal and breath sounds normal.  Abdominal: Soft. Bowel sounds are normal. There is no  tenderness.  Musculoskeletal: Normal range of motion. Exhibits no edema.  Lymphadenopathy:  Has no cervical adenopathy.  Neurological: Pt is alert and oriented to person, place, and time. Pt has normal reflexes. No cranial nerve deficit.   Skin: Skin is warm and dry. No rash noted.  Psychiatric:  Has  normal mood and affect. Behavior is normal.         Assessment & Plan:

## 2010-08-09 NOTE — Assessment & Plan Note (Signed)
stable overall by hx and exam, most recent data reviewed with pt, and pt to continue medical treatment as before, goal ldl < 70 - to change pravachol to lipitor with f/u labs at 4 wks, for lower chol diet

## 2010-08-09 NOTE — Patient Instructions (Addendum)
Take all new medications as prescribed Continue all other medications as before Please go to LAB in the Basement for the blood and/or urine tests to be done today Please call the phone number [phone] (the PhoneTree System) for results of testing in 2-3 days;  When calling, simply dial the number, and when prompted enter the MRN number above (the Medical Record Number) and the # key, then the message should start. You will be contacted regarding the referral for: colonoscopy Please return in 1 year for your yearly visit, or sooner if needed, with Lab testing done 3-5 days before

## 2010-08-09 NOTE — Assessment & Plan Note (Signed)
Overall doing well, age appropriate education and counseling updated, referrals for preventative services and immunizations addressed, dietary and smoking counseling addressed, most recent labs and ECG reviewed.  I have personally reviewed and have noted: 1) the patient's medical and social history 2) The pt's use of alcohol, tobacco, and illicit drugs 3) The patient's current medications and supplements 4) Functional ability including ADL's, fall risk, home safety risk, hearing and visual impairment 5) Diet and physical activities 6) Evidence for depression or mood disorder 7) The patient's height, weight, and BMI have been recorded in the chart I have made referrals, and provided counseling and education based on review of the above To be scheduled for colonscopy as he is due

## 2010-08-15 ENCOUNTER — Telehealth: Payer: Self-pay | Admitting: *Deleted

## 2010-08-15 NOTE — Telephone Encounter (Signed)
Patient picked up Rx

## 2010-10-22 LAB — URINALYSIS, ROUTINE W REFLEX MICROSCOPIC
Glucose, UA: NEGATIVE
Protein, ur: NEGATIVE
pH: 6

## 2010-10-25 ENCOUNTER — Telehealth: Payer: Self-pay

## 2010-10-25 NOTE — Telephone Encounter (Signed)
Called the patient left message to inform Viagra prescription from [facility] has arrived and is ready to be picked up at front desk.

## 2010-10-28 LAB — LIPID PANEL
HDL: 29 — ABNORMAL LOW
LDL Cholesterol: 123 — ABNORMAL HIGH
Total CHOL/HDL Ratio: 5.7
Triglycerides: 72
VLDL: 14

## 2010-10-28 LAB — DIFFERENTIAL
Eosinophils Relative: 1
Lymphocytes Relative: 17
Lymphs Abs: 1.4
Neutro Abs: 6.3

## 2010-10-28 LAB — BASIC METABOLIC PANEL WITH GFR
BUN: 18
Calcium: 9.6
GFR calc non Af Amer: 55 — ABNORMAL LOW
Glucose, Bld: 75
Sodium: 138

## 2010-10-28 LAB — CARDIAC PANEL(CRET KIN+CKTOT+MB+TROPI)
CK, MB: 1
Relative Index: INVALID
Total CK: 91

## 2010-10-28 LAB — BASIC METABOLIC PANEL
CO2: 28
Chloride: 104
Creatinine, Ser: 1.3
GFR calc Af Amer: >60
Potassium: 5

## 2010-10-28 LAB — CBC
HCT: 41.2
Hemoglobin: 14.3
Platelets: 224
WBC: 8.3

## 2010-10-28 LAB — POCT CARDIAC MARKERS
CKMB, poc: <1 — ABNORMAL LOW
Myoglobin, poc: 136
Operator id: 2725
Troponin i, poc: <0.05

## 2010-10-28 LAB — TROPONIN I: Troponin I: 0.02

## 2010-10-28 LAB — D-DIMER, QUANTITATIVE: D-Dimer, Quant: 0.5 — ABNORMAL HIGH

## 2010-10-28 LAB — CK TOTAL AND CKMB (NOT AT ARMC): Relative Index: INVALID

## 2010-12-17 ENCOUNTER — Telehealth: Payer: Self-pay

## 2010-12-17 NOTE — Telephone Encounter (Signed)
Patient requesting to reorder viagra from [employer]. [employer] [phone], [id number] and order number [id number]. The order will be processed and sent out on December 23, 2010 will take 7 to 10 business days to arrive at our office. Informed the patient of information and we will call to pickup prescription once at our office. Also the patient is having a urination problem, informed the patient to schedule appointment with [facility], the patient agreed and did so.

## 2011-01-23 ENCOUNTER — Other Ambulatory Visit: Payer: Self-pay | Admitting: Internal Medicine

## 2011-02-06 ENCOUNTER — Ambulatory Visit (INDEPENDENT_AMBULATORY_CARE_PROVIDER_SITE_OTHER): Payer: Medicare PPO | Admitting: Internal Medicine

## 2011-02-06 ENCOUNTER — Encounter: Payer: Self-pay | Admitting: Internal Medicine

## 2011-02-06 ENCOUNTER — Other Ambulatory Visit (INDEPENDENT_AMBULATORY_CARE_PROVIDER_SITE_OTHER): Payer: Medicare PPO

## 2011-02-06 VITALS — BP 108/68 | HR 58 | Temp 97.0°F | Ht 73.5 in | Wt 194.0 lb

## 2011-02-06 DIAGNOSIS — E785 Hyperlipidemia, unspecified: Secondary | ICD-10-CM

## 2011-02-06 DIAGNOSIS — N189 Chronic kidney disease, unspecified: Secondary | ICD-10-CM

## 2011-02-06 DIAGNOSIS — N183 Chronic kidney disease, stage 3 unspecified: Secondary | ICD-10-CM | POA: Diagnosis present

## 2011-02-06 DIAGNOSIS — M109 Gout, unspecified: Secondary | ICD-10-CM

## 2011-02-06 DIAGNOSIS — Z0000 Encounter for general adult medical examination without abnormal findings: Secondary | ICD-10-CM

## 2011-02-06 DIAGNOSIS — K219 Gastro-esophageal reflux disease without esophagitis: Secondary | ICD-10-CM

## 2011-02-06 DIAGNOSIS — N400 Enlarged prostate without lower urinary tract symptoms: Secondary | ICD-10-CM

## 2011-02-06 HISTORY — DX: Gastro-esophageal reflux disease without esophagitis: K21.9

## 2011-02-06 HISTORY — DX: Chronic kidney disease, stage 3 unspecified: N18.30

## 2011-02-06 HISTORY — DX: Benign prostatic hyperplasia without lower urinary tract symptoms: N40.0

## 2011-02-06 MED ORDER — ATORVASTATIN CALCIUM 10 MG PO TABS: 10.0000 mg | ORAL_TABLET | Freq: Every day | ORAL | Status: DC

## 2011-02-06 MED ORDER — TAMSULOSIN HCL 0.4 MG PO CAPS: 0.4000 mg | ORAL_CAPSULE | Freq: Every day | ORAL | Status: DC

## 2011-02-06 MED ORDER — ALLOPURINOL 100 MG PO TABS: 100.0000 mg | ORAL_TABLET | Freq: Every day | ORAL | Status: DC

## 2011-02-06 MED ORDER — VARDENAFIL HCL 20 MG PO TABS: 20.0000 mg | ORAL_TABLET | Freq: Every day | ORAL | Status: DC | PRN

## 2011-02-06 MED ORDER — OMEPRAZOLE 20 MG PO CPDR: 20.0000 mg | DELAYED_RELEASE_CAPSULE | Freq: Two times a day (BID) | ORAL | Status: AC

## 2011-02-06 MED ORDER — INDOMETHACIN 50 MG PO CAPS: 50.0000 mg | ORAL_CAPSULE | Freq: Three times a day (TID) | ORAL | Status: DC

## 2011-02-06 NOTE — Assessment & Plan Note (Signed)
Also for trial flomax, declines urology referral at this time

## 2011-02-06 NOTE — Assessment & Plan Note (Signed)
For bid 20 mg prilosec asd,  to f/u any worsening symptoms or concerns, to GI if persists or worsening

## 2011-02-06 NOTE — Progress Notes (Signed)
Subjective:    Patient ID: Alan Franco, male    DOB: 1940/06/24, 71 y.o.   MRN: [id number]  HPI  Here to f/u; overall doing ok,  Pt denies chest pain, increased sob or doe, wheezing, orthopnea, PND, increased LE swelling, palpitations, dizziness or syncope., except for freq reflux symtpoms in the past month without dysphagia, abd pain, n/v, bowel change or blood   Pt denies new neurological symptoms such as new headache, or facial or extremity weakness or numbness   Pt denies polydipsia, polyuria,   Pt states overall good compliance with meds, trying to follow lower cholesterol diet, wt overall stable but little exercise however, but has been away from taking the lipitor b/c "lost track" of what it was for.   No recent gout symptoms on current meds.  Does have signficant worsening 6 mo urinary mild slower flow, dribbling adn some difficutlyt getting started.   Denies urinary symptoms such as dysuria, frequency, urgency,or hematuria.  Pt denies fever, wt loss, night sweats, loss of appetite, or other constitutional symptoms Past Medical History  Diagnosis Date  . ALCOHOL ABUSE 08/24/2009  . CAROTID ARTERY DISEASE 08/30/2009  . CELLULITIS AND ABSCESS OF LEG EXCEPT FOOT 11/27/2009  . COPD 08/24/2009  . CORONARY ARTERY DISEASE 08/24/2009  . 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 GOUT 08/24/2009  . HEPATITIS C 08/24/2009  . HYPERLIPIDEMIA 08/24/2009  . HYPERTENSION 08/24/2009  . KNEE PAIN 02/14/2010  . MRSA 08/24/2009  . PERIPHERAL VASCULAR DISEASE 08/24/2009  . Preventative health care 08/09/2010  . RENAL INSUFFICIENCY 08/24/2009  . SICKLE CELL TRAIT 08/24/2009  . CKD (chronic kidney disease) 02/06/2011  . GERD (gastroesophageal reflux disease) 02/06/2011  . BPH (benign prostatic hyperplasia) 02/06/2011   Past Surgical History  Procedure Date  . Knee surgury     arthoscopic on the right, Dr. Madelon Lips    reports that he has quit smoking. He does not have any smokeless tobacco history on file. He reports that he  does not drink alcohol or use illicit drugs. family history includes Alcohol abuse in his father and mother. No Known Allergies Current Outpatient Prescriptions on File Prior to Visit  Medication Sig Dispense Refill  . sildenafil (VIAGRA) 100 MG tablet Take 1 tablet (100 mg total) by mouth as needed for erectile dysfunction.  30 tablet  5   Review of Systems Review of Systems  Constitutional: Negative for diaphoresis and unexpected weight change.  HENT: Negative for drooling and tinnitus.   Eyes: Negative for photophobia and visual disturbance.  Respiratory: Negative for choking and stridor.   Gastrointestinal: Negative for vomiting and blood in stool.  Genitourinary: Negative for hematuria and decreased urine volume.    Objective:   Physical Exam BP 108/68  Pulse 58  Temp(Src) 97 F (36.1 C) (Oral)  Ht 6' 1.5" (1.867 m)  Wt 194 lb (87.998 kg)  BMI 25.25 kg/m2  SpO2 97% Physical Exam  VS noted Constitutional: Pt appears well-developed and well-nourished.  HENT: Head: Normocephalic.  Right Ear: External ear normal.  Left Ear: External ear normal.  Eyes: Conjunctivae and EOM are normal. Pupils are equal, round, and reactive to light.  Neck: Normal range of motion. Neck supple.  Cardiovascular: Normal rate and regular rhythm.   Pulmonary/Chest: Effort normal and breath sounds normal.  Abd:  Soft, NT, non-distended, + BS except for mild upper abd tender, no guarding or rebound Neurological: Pt is alert. No cranial nerve deficit.  Skin: Skin is warm. No erythema.  Psychiatric: Pt behavior  is normal. Thought content normal.     Assessment & Plan:

## 2011-02-06 NOTE — Assessment & Plan Note (Signed)
D/w pt, stable overall by hx and exam, most recent data reviewed with pt, and pt to continue medical treatment as before Lab Results  Component Value Date   LDLCALC 101* 08/09/2010   To re-start the lipitor

## 2011-02-06 NOTE — Assessment & Plan Note (Signed)
stable overall by hx and exam, most recent data reviewed with pt, and pt to continue medical treatment as before  Lab Results  Component Value Date   WBC 6.1 08/09/2010   HGB 13.7 08/09/2010   HCT 40.9 08/09/2010   PLT 195.0 08/09/2010   GLUCOSE 96 08/09/2010   CHOL 171 08/09/2010   TRIG 148.0 08/09/2010   HDL 40.40 08/09/2010   LDLCALC 101* 08/09/2010   ALT 55* 08/09/2010   AST 52* 08/09/2010   NA 137 08/09/2010   K 4.1 08/09/2010   CL 101 08/09/2010   CREATININE 1.8* 08/09/2010   BUN 24* 08/09/2010   CO2 30 08/09/2010   TSH 1.78 08/09/2010   PSA 1.14 08/09/2010   INR 1.0 03/02/2008

## 2011-02-06 NOTE — Patient Instructions (Signed)
Take all new medications as prescribed Continue all other medications as before Please go to LAB in the Basement for the blood and/or urine tests to be done today Please call the phone number [phone] (the PhoneTree System) for results of testing in 2-3 days;  When calling, simply dial the number, and when prompted enter the MRN number above (the Medical Record Number) and the # key, then the message should start. Please return in 6 months with Lab testing done 3-5 days before

## 2011-02-06 NOTE — Assessment & Plan Note (Signed)
stable overall by hx and exam, and pt to continue medical treatment as before 

## 2011-02-07 LAB — BASIC METABOLIC PANEL
Chloride: 104 mEq/L (ref 96–112)
GFR: 54.59 mL/min — ABNORMAL LOW (ref >60.00)
Potassium: 3.5 mEq/L (ref 3.5–5.1)
Sodium: 139 mEq/L (ref 135–145)

## 2011-03-06 ENCOUNTER — Telehealth: Payer: Self-pay

## 2011-03-06 NOTE — Telephone Encounter (Signed)
Called the patient left message that Viagra sent from [facility] is ready for pickup at the front desk.

## 2011-03-11 ENCOUNTER — Encounter: Payer: Self-pay | Admitting: Internal Medicine

## 2011-03-11 ENCOUNTER — Ambulatory Visit (INDEPENDENT_AMBULATORY_CARE_PROVIDER_SITE_OTHER): Payer: Medicare PPO | Admitting: Internal Medicine

## 2011-03-11 ENCOUNTER — Other Ambulatory Visit (INDEPENDENT_AMBULATORY_CARE_PROVIDER_SITE_OTHER): Payer: Medicare PPO

## 2011-03-11 VITALS — BP 130/80 | HR 68 | Temp 97.2°F | Ht 73.5 in | Wt 195.0 lb

## 2011-03-11 DIAGNOSIS — R040 Epistaxis: Secondary | ICD-10-CM

## 2011-03-11 DIAGNOSIS — J449 Chronic obstructive pulmonary disease, unspecified: Secondary | ICD-10-CM

## 2011-03-11 DIAGNOSIS — I10 Essential (primary) hypertension: Secondary | ICD-10-CM

## 2011-03-11 LAB — CBC WITH DIFFERENTIAL/PLATELET
Basophils Relative: 0.3 % (ref 0.0–3.0)
Eosinophils Relative: 1.2 % (ref 0.0–5.0)
HCT: 40.7 % (ref 39.0–52.0)
Lymphs Abs: 2.5 10*3/uL (ref 0.7–4.0)
MCV: 91.8 fl (ref 78.0–100.0)
Monocytes Absolute: 0.8 10*3/uL (ref 0.1–1.0)
Monocytes Relative: 12.1 % — ABNORMAL HIGH (ref 3.0–12.0)
Neutrophils Relative %: 47 % (ref 43.0–77.0)
RBC: 4.43 Mil/uL (ref 4.22–5.81)
WBC: 6.3 10*3/uL (ref 4.5–10.5)

## 2011-03-11 NOTE — Patient Instructions (Addendum)
Continue all other medications as before Please go to LAB in the Basement for the blood and/or urine tests to be done today - the cbc (blood count) Please call the phone number [phone] (the PhoneTree System) for results of testing in 2-3 days;  When calling, simply dial the number, and when prompted enter the MRN number above (the Medical Record Number) and the # key, then the message should start. You will be contacted regarding the referral for: ENT (ear , nose, and throat doctor)

## 2011-03-15 ENCOUNTER — Encounter: Payer: Self-pay | Admitting: Internal Medicine

## 2011-03-15 NOTE — Assessment & Plan Note (Signed)
No evidence for acute today or sinus infection, for ENT referral, likely needs cautery

## 2011-03-15 NOTE — Assessment & Plan Note (Signed)
stable overall by hx and exam, most recent data reviewed with pt, and pt to continue medical treatment as before SpO2 Readings from Last 3 Encounters:  03/11/11 96%  02/06/11 97%  08/09/10 95%

## 2011-03-15 NOTE — Assessment & Plan Note (Signed)
stable overall by hx and exam, most recent data reviewed with pt, and pt to continue medical treatment as before BP Readings from Last 3 Encounters:  03/11/11 130/80  02/06/11 108/68  08/09/10 100/68

## 2011-03-15 NOTE — Progress Notes (Signed)
Subjective:    Patient ID: Alan Franco, male    DOB: [birth year], 71 y.o.   MRN: [id number]  HPI  Here to f/u;  Pt denies chest pain, increased sob or doe, wheezing, orthopnea, PND, increased LE swelling, palpitations, dizziness or syncope.  Pt denies new neurological symptoms such as new headache, or facial or extremity weakness or numbness   Pt denies polydipsia, polyuria, .  Pt states overall good compliance with meds, trying to follow lower cholesterol, diabetic diet, wt overall stable.   Pt denies fever, wt loss, night sweats, loss of appetite, or other constitutional symptoms  No sinus pain, but has had recurrent nasal bleeds at least 1-2x/wk for 2 yrs.  No other bruising or bleeding.   Past Medical History  Diagnosis Date  . ALCOHOL ABUSE 08/24/2009  . CAROTID ARTERY DISEASE 08/30/2009  . CELLULITIS AND ABSCESS OF LEG EXCEPT FOOT 11/27/2009  . COPD 08/24/2009  . CORONARY ARTERY DISEASE 08/24/2009  . 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 GOUT 08/24/2009  . HEPATITIS C 08/24/2009  . HYPERLIPIDEMIA 08/24/2009  . HYPERTENSION 08/24/2009  . KNEE PAIN 02/14/2010  . MRSA 08/24/2009  . PERIPHERAL VASCULAR DISEASE 08/24/2009  . Preventative health care 08/09/2010  . RENAL INSUFFICIENCY 08/24/2009  . SICKLE CELL TRAIT 08/24/2009  . CKD (chronic kidney disease) 02/06/2011  . GERD (gastroesophageal reflux disease) 02/06/2011  . BPH (benign prostatic hyperplasia) 02/06/2011   Past Surgical History  Procedure Date  . Knee surgury     arthoscopic on the right, Dr. Madelon Lips    reports that he has quit smoking. He does not have any smokeless tobacco history on file. He reports that he does not drink alcohol or use illicit drugs. family history includes Alcohol abuse in his father and mother. No Known Allergies Current Outpatient Prescriptions on File Prior to Visit  Medication Sig Dispense Refill  . allopurinol (ZYLOPRIM) 100 MG tablet Take 1 tablet (100 mg total) by mouth daily.  90 tablet  3  . atorvastatin  (LIPITOR) 10 MG tablet Take 1 tablet (10 mg total) by mouth daily.  90 tablet  3  . indomethacin (INDOCIN) 50 MG capsule Take 1 capsule (50 mg total) by mouth 3 (three) times daily. As needed for gout only  90 capsule  2  . omeprazole (PRILOSEC) 20 MG capsule Take 1 capsule (20 mg total) by mouth 2 (two) times daily.  180 capsule  3  . sildenafil (VIAGRA) 100 MG tablet Take 1 tablet (100 mg total) by mouth as needed for erectile dysfunction.  30 tablet  5  . Tamsulosin HCl (FLOMAX) 0.4 MG CAPS Take 1 capsule (0.4 mg total) by mouth daily.  90 capsule  3  . vardenafil (LEVITRA) 20 MG tablet Take 1 tablet (20 mg total) by mouth daily as needed for erectile dysfunction.  5 tablet  11   Review of Systems Review of Systems  Constitutional: Negative for diaphoresis and unexpected weight change.  HENT: Negative for drooling and tinnitus.   Eyes: Negative for photophobia and visual disturbance.  Respiratory: Negative for choking and stridor.   Gastrointestinal: Negative for vomiting and blood in stool.  Genitourinary: Negative for hematuria and decreased urine volume.    Objective:   Physical Exam BP 130/80  Pulse 68  Temp(Src) 97.2 F (36.2 C) (Oral)  Ht 6' 1.5" (1.867 m)  Wt 195 lb (88.451 kg)  BMI 25.38 kg/m2  SpO2 96% Physical Exam  VS noted Constitutional: Pt appears well-developed and well-nourished.  HENT:  Head: Normocephalic.  Right Ear: External ear normal.  Left Ear: External ear normal.  Bilat tm's mild erythema.  Sinus nontender.  Pharynx mild erythema Eyes: Conjunctivae and EOM are normal. Pupils are equal, round, and reactive to light.  Neck: Normal range of motion. Neck supple.  Cardiovascular: Normal rate and regular rhythm.   Pulmonary/Chest: Effort normal and breath sounds mild decreased bilat, no wheeze Neurological: Pt is alert. No cranial nerve deficit.  Skin: Skin is warm. No erythema.  Psychiatric: Pt behavior is normal. Thought content normal. 1+ nervous      Assessment & Plan:

## 2011-04-24 ENCOUNTER — Telehealth: Payer: Self-pay

## 2011-04-24 NOTE — Telephone Encounter (Signed)
Called [employer] to have the patients Viagra from patient assistance refilled. Called [phone]. The order will be filled on 05/03/11 (Viagra 100 mg #30) and will take 7 to 10 days to receive. The order number is [id number]. Called the patient left detailed message per patient request on his cell [phone] that order had been placed and when to expect to arrive at our office. Informed he would receive a call to pickup medication.

## 2011-05-08 ENCOUNTER — Telehealth: Payer: Self-pay

## 2011-05-08 NOTE — Telephone Encounter (Signed)
Received Viagra from patient assistance. Called the patient informed medication has arrived and has been put in cabnet for the patient to pickup at his convenience.

## 2011-06-23 ENCOUNTER — Telehealth: Payer: Self-pay | Admitting: Internal Medicine

## 2011-06-23 NOTE — Telephone Encounter (Signed)
Request refill on viagra, please call into [facility], pt request call afterwards

## 2011-06-25 NOTE — Telephone Encounter (Signed)
[employer] [phone] ID# [id number] for Viagra 100 mg #60 to be shipped 07/05/2011. Pt informed of same.   Order number [id number]. 7-10 for receipt.

## 2011-08-06 ENCOUNTER — Encounter: Payer: Self-pay | Admitting: Internal Medicine

## 2011-08-06 ENCOUNTER — Ambulatory Visit (INDEPENDENT_AMBULATORY_CARE_PROVIDER_SITE_OTHER): Payer: Medicare PPO | Admitting: Internal Medicine

## 2011-08-06 ENCOUNTER — Telehealth: Payer: Self-pay | Admitting: Internal Medicine

## 2011-08-06 ENCOUNTER — Other Ambulatory Visit (INDEPENDENT_AMBULATORY_CARE_PROVIDER_SITE_OTHER): Payer: Medicare PPO

## 2011-08-06 VITALS — BP 132/70 | HR 75 | Temp 97.9°F | Ht 73.5 in | Wt 188.1 lb

## 2011-08-06 DIAGNOSIS — Z125 Encounter for screening for malignant neoplasm of prostate: Secondary | ICD-10-CM

## 2011-08-06 DIAGNOSIS — N189 Chronic kidney disease, unspecified: Secondary | ICD-10-CM

## 2011-08-06 DIAGNOSIS — Z79899 Other long term (current) drug therapy: Secondary | ICD-10-CM

## 2011-08-06 DIAGNOSIS — M549 Dorsalgia, unspecified: Secondary | ICD-10-CM

## 2011-08-06 DIAGNOSIS — E785 Hyperlipidemia, unspecified: Secondary | ICD-10-CM

## 2011-08-06 DIAGNOSIS — M1990 Unspecified osteoarthritis, unspecified site: Secondary | ICD-10-CM

## 2011-08-06 DIAGNOSIS — J449 Chronic obstructive pulmonary disease, unspecified: Secondary | ICD-10-CM

## 2011-08-06 DIAGNOSIS — Z0000 Encounter for general adult medical examination without abnormal findings: Secondary | ICD-10-CM

## 2011-08-06 DIAGNOSIS — M109 Gout, unspecified: Secondary | ICD-10-CM

## 2011-08-06 LAB — CBC WITH DIFFERENTIAL/PLATELET
Basophils Relative: 0.4 % (ref 0.0–3.0)
Eosinophils Absolute: 0.1 10*3/uL (ref 0.0–0.7)
Lymphocytes Relative: 30.6 % (ref 12.0–46.0)
MCHC: 33.5 g/dL (ref 30.0–36.0)
MCV: 91.3 fl (ref 78.0–100.0)
Monocytes Absolute: 0.8 10*3/uL (ref 0.1–1.0)
Neutrophils Relative %: 53 % (ref 43.0–77.0)
Platelets: 217 10*3/uL (ref 150.0–400.0)
RBC: 4.53 Mil/uL (ref 4.22–5.81)
WBC: 5.5 10*3/uL (ref 4.5–10.5)

## 2011-08-06 LAB — HEPATIC FUNCTION PANEL
ALT: 59 U/L — ABNORMAL HIGH (ref 0–53)
AST: 57 U/L — ABNORMAL HIGH (ref 0–37)
Bilirubin, Direct: 0.1 mg/dL (ref 0.0–0.3)
Total Protein: 8.1 g/dL (ref 6.0–8.3)

## 2011-08-06 LAB — LIPID PANEL
Cholesterol: 169 mg/dL (ref 0–200)
Triglycerides: 136 mg/dL (ref 0.0–149.0)

## 2011-08-06 LAB — BASIC METABOLIC PANEL
BUN: 23 mg/dL (ref 6–23)
CO2: 24 mEq/L (ref 19–32)
Chloride: 104 mEq/L (ref 96–112)
Creatinine, Ser: 1.6 mg/dL — ABNORMAL HIGH (ref 0.4–1.5)
Potassium: 4.1 mEq/L (ref 3.5–5.1)

## 2011-08-06 LAB — URINALYSIS, ROUTINE W REFLEX MICROSCOPIC
Bilirubin Urine: NEGATIVE
Hgb urine dipstick: NEGATIVE
Leukocytes, UA: NEGATIVE
Nitrite: NEGATIVE
Total Protein, Urine: NEGATIVE
Urobilinogen, UA: 0.2 (ref 0.0–1.0)

## 2011-08-06 LAB — TSH: TSH: 1.17 u[IU]/mL (ref 0.35–5.50)

## 2011-08-06 MED ORDER — CELECOXIB 100 MG PO CAPS: 100.0000 mg | ORAL_CAPSULE | Freq: Two times a day (BID) | ORAL | Status: DC | PRN

## 2011-08-06 MED ORDER — FLUTICASONE PROPIONATE HFA 110 MCG/ACT IN AERO: 1.0000 | INHALATION_SPRAY | Freq: Two times a day (BID) | RESPIRATORY_TRACT | Status: DC

## 2011-08-06 MED ORDER — CYCLOBENZAPRINE HCL 5 MG PO TABS: 5.0000 mg | ORAL_TABLET | Freq: Three times a day (TID) | ORAL | Status: AC | PRN

## 2011-08-06 MED ORDER — IPRATROPIUM-ALBUTEROL 20-100 MCG/ACT IN AERS: 1.0000 | INHALATION_SPRAY | Freq: Four times a day (QID) | RESPIRATORY_TRACT | Status: DC | PRN

## 2011-08-06 NOTE — Assessment & Plan Note (Signed)

## 2011-08-06 NOTE — Telephone Encounter (Signed)
Called Patient Assistance phone line and the operator stated the patient ordered his Viagra 100mg  qty 30 on June 15th.  She states he is not eligable for a refill until August 18th.

## 2011-08-06 NOTE — Patient Instructions (Addendum)
You will be contacted regarding the referral for: colonoscopy Take all new medications as prescribed Continue all other medications as before Please go to LAB in the Basement for the blood and/or urine tests to be done today You will be contacted by phone if any changes need to be made immediately.  Otherwise, you will receive a letter about your results with an explanation. Please return in 6 mo with Lab testing done 3-5 days before

## 2011-08-10 ENCOUNTER — Encounter: Payer: Self-pay | Admitting: Internal Medicine

## 2011-08-10 DIAGNOSIS — M549 Dorsalgia, unspecified: Secondary | ICD-10-CM | POA: Insufficient documentation

## 2011-08-10 DIAGNOSIS — M1990 Unspecified osteoarthritis, unspecified site: Secondary | ICD-10-CM

## 2011-08-10 HISTORY — DX: Unspecified osteoarthritis, unspecified site: M19.90

## 2011-08-10 NOTE — Assessment & Plan Note (Signed)
To add flovent 110 asd,  to f/u any worsening symptoms or concerns

## 2011-08-10 NOTE — Assessment & Plan Note (Signed)
Ok for celebrex 100 bid prn,  to f/u any worsening symptoms or concerns

## 2011-08-10 NOTE — Progress Notes (Signed)
Subjective:    Patient ID: Alan Franco, male    DOB: 09-May-1940, 71 y.o.   MRN: [id number]  HPI  Here for wellness and f/u;  Overall doing ok;  Pt denies CP, wheezing, orthopnea, PND, worsening LE edema, palpitations, dizziness or syncope, but has had mild worsening sob/doe without cough, fever.  .  Pt denies neurological change such as new Headache, facial or extremity weakness.  Pt denies polydipsia, polyuria, or low sugar symptoms. Pt states overall good compliance with treatment and medications, good tolerability, and trying to follow lower cholesterol diet.  Pt denies worsening depressive symptoms, suicidal ideation or panic. No fever, wt loss, night sweats, loss of appetite, or other constitutional symptoms.  Pt states good ability with ADL's, low fall risk, home safety reviewed and adequate, no significant changes in hearing or vision, and occasionally active with exercise.  Pt continues to have 3 days recurring bilat LBP without change in severity, bowel or bladder change, fever, wt loss,  worsening LE pain/numbness/weakness, gait change or falls.  Has several arthritic joints as well, asks for pain med.   Past Medical History  Diagnosis Date  . ALCOHOL ABUSE 08/24/2009  . CAROTID ARTERY DISEASE 08/30/2009  . CELLULITIS AND ABSCESS OF LEG EXCEPT FOOT 11/27/2009  . COPD 08/24/2009  . CORONARY ARTERY DISEASE 08/24/2009  . 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 GOUT 08/24/2009  . HEPATITIS C 08/24/2009  . HYPERLIPIDEMIA 08/24/2009  . HYPERTENSION 08/24/2009  . KNEE PAIN 02/14/2010  . MRSA 08/24/2009  . PERIPHERAL VASCULAR DISEASE 08/24/2009  . Preventative health care 08/09/2010  . RENAL INSUFFICIENCY 08/24/2009  . SICKLE CELL TRAIT 08/24/2009  . CKD (chronic kidney disease) 02/06/2011  . GERD (gastroesophageal reflux disease) 02/06/2011  . BPH (benign prostatic hyperplasia) 02/06/2011   Past Surgical History  Procedure Date  . Knee surgury     arthoscopic on the right, Dr. Madelon Lips    reports that he  has quit smoking. He does not have any smokeless tobacco history on file. He reports that he does not drink alcohol or use illicit drugs. family history includes Alcohol abuse in his father and mother. No Known Allergies Current Outpatient Prescriptions on File Prior to Visit  Medication Sig Dispense Refill  . allopurinol (ZYLOPRIM) 100 MG tablet Take 1 tablet (100 mg total) by mouth daily.  90 tablet  3  . atorvastatin (LIPITOR) 10 MG tablet Take 1 tablet (10 mg total) by mouth daily.  90 tablet  3  . indomethacin (INDOCIN) 50 MG capsule Take 1 capsule (50 mg total) by mouth 3 (three) times daily. As needed for gout only  90 capsule  2  . omeprazole (PRILOSEC) 20 MG capsule Take 1 capsule (20 mg total) by mouth 2 (two) times daily.  180 capsule  3  . sildenafil (VIAGRA) 100 MG tablet Take 1 tablet (100 mg total) by mouth as needed for erectile dysfunction.  30 tablet  5  . Tamsulosin HCl (FLOMAX) 0.4 MG CAPS Take 1 capsule (0.4 mg total) by mouth daily.  90 capsule  3  . vardenafil (LEVITRA) 20 MG tablet Take 1 tablet (20 mg total) by mouth daily as needed for erectile dysfunction.  5 tablet  11  . fluticasone (FLOVENT HFA) 110 MCG/ACT inhaler Inhale 1 puff into the lungs 2 (two) times daily.  1 Inhaler  12  . Ipratropium-Albuterol (COMBIVENT RESPIMAT) 20-100 MCG/ACT AERS respimat Inhale 1 puff into the lungs every 6 (six) hours as needed for wheezing.  4 g  11   Review of Systems Review of Systems  Constitutional: Negative for diaphoresis, activity change, appetite change and unexpected weight change.  HENT: Negative for hearing loss, ear pain, facial swelling, mouth sores and neck stiffness.   Eyes: Negative for pain, redness and visual disturbance.  Respiratory: Negative for shortness of breath and wheezing.   Cardiovascular: Negative for chest pain and palpitations.  Gastrointestinal: Negative for diarrhea, blood in stool, abdominal distention and rectal pain.  Genitourinary: Negative for  hematuria, flank pain and decreased urine volume.  Musculoskeletal: Negative for myalgias and joint swelling. except for the above Skin: Negative for color change and wound.  Neurological: Negative for syncope and numbness.  Hematological: Negative for adenopathy.  Psychiatric/Behavioral: Negative for hallucinations, self-injury, decreased concentration and agitation.     Objective:   Physical Exam BP 132/70  Pulse 75  Temp 97.9 F (36.6 C) (Oral)  Ht 6' 1.5" (1.867 m)  Wt 188 lb 2 oz (85.333 kg)  BMI 24.48 kg/m2  SpO2 96% Physical Exam  VS noted Constitutional: Pt is oriented to person, place, and time. Appears well-developed and well-nourished.  HENT:  Head: Normocephalic and atraumatic.  Right Ear: External ear normal.  Left Ear: External ear normal.  Nose: Nose normal.  Mouth/Throat: Oropharynx is clear and moist.  Eyes: Conjunctivae and EOM are normal. Pupils are equal, round, and reactive to light.  Neck: Normal range of motion. Neck supple. No JVD present. No tracheal deviation present.  Cardiovascular: Normal rate, regular rhythm, normal heart sounds and intact distal pulses.   Pulmonary/Chest: Effort normal and breath sounds decreased no rales or wheezing.  Abdominal: Soft. Bowel sounds are normal. There is no tenderness.  Musculoskeletal: Normal range of motion. Exhibits no edema.  Lymphadenopathy:  Has no cervical adenopathy.  Neurological: Pt is alert and oriented to person, place, and time. Pt has normal reflexes. No cranial nerve deficit. Motor/gait intact Skin: Skin is warm and dry. No rash noted.  Psychiatric:  Has  normal mood and affect. Behavior is normal.  Has bilat lumbar muscular tender/spasm like;  Spine nontender No joint swelling    Assessment & Plan:
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
## 2011-08-10 NOTE — Assessment & Plan Note (Signed)
stable overall by hx and exam, most recent data reviewed with pt, and pt to continue medical treatment as before Lab Results  Component Value Date   CREATININE 1.6* 08/06/2011

## 2011-08-21 ENCOUNTER — Encounter (HOSPITAL_COMMUNITY): Payer: Self-pay | Admitting: Emergency Medicine

## 2011-08-21 ENCOUNTER — Emergency Department (HOSPITAL_COMMUNITY): Payer: No Typology Code available for payment source

## 2011-08-21 ENCOUNTER — Emergency Department (HOSPITAL_COMMUNITY)
Admission: EM | Admit: 2011-08-21 | Discharge: 2011-08-21 | Disposition: A | Discharge status: Home / Self Care | Payer: No Typology Code available for payment source | Attending: Emergency Medicine | Admitting: Emergency Medicine

## 2011-08-21 DIAGNOSIS — J449 Chronic obstructive pulmonary disease, unspecified: Secondary | ICD-10-CM | POA: Insufficient documentation

## 2011-08-21 DIAGNOSIS — Z87891 Personal history of nicotine dependence: Secondary | ICD-10-CM | POA: Insufficient documentation

## 2011-08-21 DIAGNOSIS — M1990 Unspecified osteoarthritis, unspecified site: Secondary | ICD-10-CM | POA: Insufficient documentation

## 2011-08-21 DIAGNOSIS — Z8614 Personal history of Methicillin resistant Staphylococcus aureus infection: Secondary | ICD-10-CM | POA: Insufficient documentation

## 2011-08-21 DIAGNOSIS — D573 Sickle-cell trait: Secondary | ICD-10-CM | POA: Insufficient documentation

## 2011-08-21 DIAGNOSIS — M109 Gout, unspecified: Secondary | ICD-10-CM | POA: Insufficient documentation

## 2011-08-21 DIAGNOSIS — M549 Dorsalgia, unspecified: Secondary | ICD-10-CM | POA: Insufficient documentation

## 2011-08-21 DIAGNOSIS — J4489 Other specified chronic obstructive pulmonary disease: Secondary | ICD-10-CM | POA: Insufficient documentation

## 2011-08-21 DIAGNOSIS — I2510 Atherosclerotic heart disease of native coronary artery without angina pectoris: Secondary | ICD-10-CM | POA: Insufficient documentation

## 2011-08-21 DIAGNOSIS — K219 Gastro-esophageal reflux disease without esophagitis: Secondary | ICD-10-CM | POA: Insufficient documentation

## 2011-08-21 DIAGNOSIS — I779 Disorder of arteries and arterioles, unspecified: Secondary | ICD-10-CM | POA: Insufficient documentation

## 2011-08-21 DIAGNOSIS — B1920 Unspecified viral hepatitis C without hepatic coma: Secondary | ICD-10-CM | POA: Insufficient documentation

## 2011-08-21 DIAGNOSIS — E785 Hyperlipidemia, unspecified: Secondary | ICD-10-CM | POA: Insufficient documentation

## 2011-08-21 MED ORDER — HYDROCODONE-ACETAMINOPHEN 5-325 MG PO TABS
1.0000 | ORAL_TABLET | Freq: Once | ORAL | Status: AC
  Administered 2011-08-21: 1 via ORAL
  Filled 2011-08-21: qty 1

## 2011-08-21 MED ORDER — HYDROCODONE-ACETAMINOPHEN 5-325 MG PO TABS: 1.0000 | ORAL_TABLET | ORAL | Status: DC | PRN

## 2011-08-21 NOTE — ED Provider Notes (Signed)
Medical screening examination/treatment/procedure(s) were performed by non-physician practitioner and as supervising physician I was immediately available for consultation/collaboration.    Cattaleya Wien R Jacobie Stamey, MD 08/21/11 1610 

## 2011-08-21 NOTE — ED Notes (Signed)
Patient transported to X-ray 
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
## 2011-08-27 ENCOUNTER — Encounter (HOSPITAL_COMMUNITY): Payer: Self-pay

## 2011-08-27 ENCOUNTER — Emergency Department (INDEPENDENT_AMBULATORY_CARE_PROVIDER_SITE_OTHER)
Admission: EM | Admit: 2011-08-27 | Discharge: 2011-08-27 | Disposition: A | Discharge status: Home / Self Care | Payer: Self-pay | Source: Home / Self Care | Attending: Family Medicine | Admitting: Family Medicine

## 2011-08-27 DIAGNOSIS — M542 Cervicalgia: Secondary | ICD-10-CM

## 2011-08-27 DIAGNOSIS — M545 Low back pain, unspecified: Secondary | ICD-10-CM

## 2011-08-27 DIAGNOSIS — G8929 Other chronic pain: Secondary | ICD-10-CM

## 2011-08-27 MED ORDER — CELECOXIB 100 MG PO CAPS: 100.0000 mg | ORAL_CAPSULE | Freq: Two times a day (BID) | ORAL | Status: AC | PRN

## 2011-08-27 MED ORDER — CYCLOBENZAPRINE HCL 5 MG PO TABS: 5.0000 mg | ORAL_TABLET | Freq: Two times a day (BID) | ORAL | Status: AC | PRN

## 2011-08-27 MED ORDER — TRAMADOL HCL 50 MG PO TABS: 50.0000 mg | ORAL_TABLET | Freq: Four times a day (QID) | ORAL | Status: AC | PRN
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
## 2011-08-28 ENCOUNTER — Encounter: Payer: Self-pay | Admitting: Internal Medicine

## 2011-08-28 ENCOUNTER — Ambulatory Visit (INDEPENDENT_AMBULATORY_CARE_PROVIDER_SITE_OTHER): Payer: Medicare PPO | Admitting: Internal Medicine

## 2011-08-28 VITALS — BP 130/78 | HR 58 | Temp 97.0°F | Ht 73.5 in | Wt 187.2 lb

## 2011-08-28 DIAGNOSIS — J4489 Other specified chronic obstructive pulmonary disease: Secondary | ICD-10-CM

## 2011-08-28 DIAGNOSIS — M549 Dorsalgia, unspecified: Secondary | ICD-10-CM

## 2011-08-28 DIAGNOSIS — I10 Essential (primary) hypertension: Secondary | ICD-10-CM

## 2011-08-28 DIAGNOSIS — E785 Hyperlipidemia, unspecified: Secondary | ICD-10-CM

## 2011-08-28 DIAGNOSIS — J449 Chronic obstructive pulmonary disease, unspecified: Secondary | ICD-10-CM

## 2011-08-28 MED ORDER — PREDNISONE 10 MG PO TABS: ORAL_TABLET | ORAL | Status: DC

## 2011-08-28 NOTE — Progress Notes (Signed)
Subjective:    Patient ID: Alan Franco, male    DOB: 12/21/1940, 71 y.o.   MRN: [id number]  HPI  Here to f/u; overall doing ok,  Pt denies chest pain, increased sob or doe, wheezing, orthopnea, PND, increased LE swelling, palpitations, dizziness or syncope.  Pt denies new neurological symptoms such as new headache, or facial or extremity weakness or numbness   Pt denies polydipsia, polyuria,  Pt states overall good compliance with meds, trying to follow lower cholesterol diet, wt overall stable.   Does C/o pain initially to the post neck and lower back, both diffuse, seen in ER aug 1 with films neg for fx but with signficant deg changes and spondylosis. Seen at [facility] yesterday, given muscle relaxer, cont the celebrex and referred to spine doctor.  Pain improved over yesterday AM, today 3-4/10 for neck and lower back (more to left neck, and right lower back to the right post thigh only but pain icnreass and numbness occurs with sitting in one place, has to continually shift places in the seat). Past Medical History  Diagnosis Date  . ALCOHOL ABUSE 08/24/2009  . CAROTID ARTERY DISEASE 08/30/2009  . CELLULITIS AND ABSCESS OF LEG EXCEPT FOOT 11/27/2009  . COPD 08/24/2009  . CORONARY ARTERY DISEASE 08/24/2009  . 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 GOUT 08/24/2009  . HEPATITIS C 08/24/2009  . HYPERLIPIDEMIA 08/24/2009  . HYPERTENSION 08/24/2009  . KNEE PAIN 02/14/2010  . MRSA 08/24/2009  . PERIPHERAL VASCULAR DISEASE 08/24/2009  . Preventative health care 08/09/2010  . RENAL INSUFFICIENCY 08/24/2009  . SICKLE CELL TRAIT 08/24/2009  . CKD (chronic kidney disease) 02/06/2011  . GERD (gastroesophageal reflux disease) 02/06/2011  . BPH (benign prostatic hyperplasia) 02/06/2011  . Degenerative joint disease 08/10/2011   Past Surgical History  Procedure Date  . Knee surgury     arthoscopic on the right, Dr. Madelon Lips    reports that he has quit smoking. He does not have any smokeless tobacco history on file. He reports  that he does not drink alcohol or use illicit drugs. family history includes Alcohol abuse in his father and mother. No Known Allergies Current Outpatient Prescriptions on File Prior to Visit  Medication Sig Dispense Refill  . allopurinol (ZYLOPRIM) 100 MG tablet Take 100 mg by mouth daily as needed. Gout      . atorvastatin (LIPITOR) 10 MG tablet Take 1 tablet (10 mg total) by mouth daily.  90 tablet  3  . celecoxib (CELEBREX) 100 MG capsule Take 1 capsule (100 mg total) by mouth 2 (two) times daily as needed for pain.  180 capsule  3  . cyclobenzaprine (FLEXERIL) 5 MG tablet Take 1 tablet (5 mg total) by mouth 2 (two) times daily as needed for muscle spasms.  30 tablet  0  . fluticasone (FLOVENT HFA) 110 MCG/ACT inhaler Inhale 1 puff into the lungs 2 (two) times daily.  1 Inhaler  12  . Ipratropium-Albuterol (COMBIVENT RESPIMAT) 20-100 MCG/ACT AERS respimat Inhale 1 puff into the lungs every 6 (six) hours as needed for wheezing.  4 g  11  . omeprazole (PRILOSEC) 20 MG capsule Take 1 capsule (20 mg total) by mouth 2 (two) times daily.  180 capsule  3  . Tamsulosin HCl (FLOMAX) 0.4 MG CAPS Take 1 capsule (0.4 mg total) by mouth daily.  90 capsule  3  . traMADol (ULTRAM) 50 MG tablet Take 1 tablet (50 mg total) by mouth every 6 (six) hours as needed for pain.  20 tablet  0  . vardenafil (LEVITRA) 20 MG tablet Take 1 tablet (20 mg total) by mouth daily as needed for erectile dysfunction.  5 tablet  11  . sildenafil (VIAGRA) 100 MG tablet Take 1 tablet (100 mg total) by mouth as needed for erectile dysfunction.  30 tablet  5   Review of Systems Constitutional: Negative for diaphoresis and unexpected weight change.  HENT: Negative for tinnitus.   Eyes: Negative for photophobia and visual disturbance.  Respiratory: Negative for choking and stridor.   Gastrointestinal: Negative for vomiting and blood in stool.  Genitourinary: Negative for hematuria and decreased urine volume.  Musculoskeletal:  Negative for gait problem.  Skin: Negative for color change and wound.  Neurological: Negative for tremors and numbness.  Psychiatric/Behavioral: Negative for decreased concentration. The patient is not hyperactive.      Objective:   Physical Exam BP 130/78  Pulse 58  Temp 97 F (36.1 C) (Oral)  Ht 6' 1.5" (1.867 m)  Wt 187 lb 4 oz (84.936 kg)  BMI 24.37 kg/m2  SpO2 97% Physical Exam  VS noted Constitutional: Pt appears well-developed and well-nourished.  HENT: Head: Normocephalic.  Right Ear: External ear normal.  Left Ear: External ear normal.  Eyes: Conjunctivae and EOM are normal. Pupils are equal, round, and reactive to light.  Neck: Normal range of motion. Neck supple.  Cardiovascular: Normal rate and regular rhythm.   Pulmonary/Chest: Effort normal and breath sounds normal.  Abd:  Soft, NT, non-distended, + BS Neurological: Pt is alert. No cranial nerve deficit. motor/dtr/gait intact Spine: mild lower c-spine midline tender without swelling, red; lumbar nontender, has bilat trapezoid tender as well Skin: Skin is warm. No erythema.  Psychiatric: Pt behavior is normal. Thought content normal. 1+ nervous    Assessment & Plan:

## 2011-08-28 NOTE — Patient Instructions (Addendum)
Take all new medications as prescribed Continue all other medications as before Your form will be filled out and faxed today You are given the results today of your most recent blood work You will be contacted regarding the referral for: [facility]

## 2011-08-31 ENCOUNTER — Encounter: Payer: Self-pay | Admitting: Internal Medicine

## 2011-08-31 ENCOUNTER — Other Ambulatory Visit: Payer: Self-pay | Admitting: Internal Medicine

## 2011-08-31 MED ORDER — SILDENAFIL CITRATE 100 MG PO TABS: 100.0000 mg | ORAL_TABLET | ORAL | Status: DC | PRN

## 2011-08-31 NOTE — Assessment & Plan Note (Signed)
stable overall by hx and exam, most recent data reviewed with pt, and pt to continue medical treatment as before SpO2 Readings from Last 3 Encounters:  08/28/11 97%  08/27/11 95%  08/21/11 100%

## 2011-08-31 NOTE — Assessment & Plan Note (Signed)
stable overall by hx and exam, most recent data reviewed with pt, and pt to continue medical treatment as before Lab Results  Component Value Date   LDLCALC 107* 08/06/2011

## 2011-08-31 NOTE — Assessment & Plan Note (Signed)
stable overall by hx and exam, most recent data reviewed with pt, and pt to continue medical treatment as before BP Readings from Last 3 Encounters:  08/28/11 130/78  08/27/11 149/84  08/21/11 144/75
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
## 2011-09-05 ENCOUNTER — Telehealth: Payer: Self-pay

## 2011-09-05 NOTE — Telephone Encounter (Signed)
Patient called to inquire if order had been placed for Viagra.  Informed at last OV we had to complete patient assistance forms and mail to him (That has at this time been completed).  The patient has received those forms and informed he needs to complete his financial part and send in to patient assitance.  Once approved we can then order his viagra. Patient understood.

## 2011-09-19 ENCOUNTER — Ambulatory Visit (AMBULATORY_SURGERY_CENTER): Payer: Medicare PPO

## 2011-09-19 VITALS — Ht 73.5 in | Wt 188.0 lb

## 2011-09-19 DIAGNOSIS — Z1211 Encounter for screening for malignant neoplasm of colon: Secondary | ICD-10-CM

## 2011-10-02 ENCOUNTER — Encounter: Payer: Self-pay | Admitting: Internal Medicine

## 2011-10-02 ENCOUNTER — Ambulatory Visit (AMBULATORY_SURGERY_CENTER): Payer: Medicare PPO | Admitting: Internal Medicine

## 2011-10-02 VITALS — BP 133/77 | HR 63 | Temp 98.5°F | Resp 18 | Ht 73.5 in | Wt 188.0 lb

## 2011-10-02 DIAGNOSIS — Z1211 Encounter for screening for malignant neoplasm of colon: Secondary | ICD-10-CM

## 2011-10-02 DIAGNOSIS — D126 Benign neoplasm of colon, unspecified: Secondary | ICD-10-CM

## 2011-10-02 DIAGNOSIS — K635 Polyp of colon: Secondary | ICD-10-CM

## 2011-10-02 MED ORDER — SODIUM CHLORIDE 0.9 % IV SOLN: 500.0000 mL | INTRAVENOUS | Status: DC

## 2011-10-02 NOTE — Op Note (Signed)
[facility] [address] [place], [address]   COLONOSCOPY PROCEDURE REPORT  PATIENT: Alan Franco, Alan Franco  MR#: [id number] BIRTHDATE: July 06, 1940 , 71  yrs. old GENDER: Male ENDOSCOPIST: Beverley Fiedler, MD REFERRED IO:NGEX, Fayrene Fearing PROCEDURE DATE:  10/02/2011 PROCEDURE:   Colonoscopy with cold biopsy polypectomy ASA CLASS:   Class III INDICATIONS:average risk screening. MEDICATIONS: MAC sedation, administered by CRNA, Fentanyl-Quick Pick, and propofol (Diprivan) 400mg  IV  DESCRIPTION OF PROCEDURE:   After the risks benefits and alternatives of the procedure were thoroughly explained, informed consent was obtained.  A digital rectal exam revealed no rectal mass.   The LB [id number]  endoscope was introduced through the anus and advanced to the cecum, which was identified by both the appendix and ileocecal valve. No adverse events experienced. The quality of the prep was Suprep excellent  The instrument was then slowly withdrawn as the colon was fully examined.   COLON FINDINGS: A sessile polyp measuring 3 mm in size was found in the sigmoid colon.  A polypectomy was performed with cold forceps. The resection was complete and the polyp tissue was completely retrieved.   Mild diverticulosis was noted in the ascending colon and sigmoid colon.  Retroflexed views revealed internal hemorrhoids. The time to cecum=3 minutes 38 seconds.  Withdrawal time=9 minutes 45 seconds.  The scope was withdrawn and the procedure completed. COMPLICATIONS: There were no complications.  ENDOSCOPIC IMPRESSION: 1.   Sessile polyp measuring 3 mm in size was found in the sigmoid colon; polypectomy was performed with cold forceps 2.   Mild diverticulosis was noted in the ascending colon and sigmoid colon 3.   Small internal hemorrhoids  RECOMMENDATIONS: 1.  Await pathology results 2.  High fiber diet 3.  If the polyp removed today is proven to be adenomatous (pre-cancerous)  polyp, you will need a repeat colonoscopy in 5 years.  Otherwise you should continue to follow colorectal cancer screening guidelines for "routine risk" patients with colonoscopy in 10 years.  You will receive a letter within 1-2 weeks with the results of your biopsy as well as final recommendations.  Please call my office if you have not received a letter after 3 weeks.  eSigned:  Beverley Fiedler, MD 10/02/2011 10:25 AM   cc: Stacie Glaze, MD and The Patient   PATIENT NAME:  Alan Franco, Alan Franco MR#: [id number]

## 2011-10-02 NOTE — Progress Notes (Signed)
Patient did not experience any of the following events: a burn prior to discharge; a fall within the facility; wrong site/side/patient/procedure/implant event; or a hospital transfer or hospital admission upon discharge from the facility. (G8907) Patient did not have preoperative order for IV antibiotic SSI prophylaxis. (G8918)  

## 2011-10-02 NOTE — Patient Instructions (Addendum)
Discharge instructions given with verbal understanding. Handouts on polyps,diverticulosis and hemorrhoids given. Resume previous medications. YOU HAD AN ENDOSCOPIC PROCEDURE TODAY AT THE [facility]: Refer to the procedure report that was given to you for any specific questions about what was found during the examination.  If the procedure report does not answer your questions, please call your gastroenterologist to clarify.  If you requested that your care partner not be given the details of your procedure findings, then the procedure report has been included in a sealed envelope for you to review at your convenience later.  YOU SHOULD EXPECT: Some feelings of bloating in the abdomen. Passage of more gas than usual.  Walking can help get rid of the air that was put into your GI tract during the procedure and reduce the bloating. If you had a lower endoscopy (such as a colonoscopy or flexible sigmoidoscopy) you may notice spotting of blood in your stool or on the toilet paper. If you underwent a bowel prep for your procedure, then you may not have a normal bowel movement for a few days.  DIET: Your first meal following the procedure should be a light meal and then it is ok to progress to your normal diet.  A half-sandwich or bowl of soup is an example of a good first meal.  Heavy or fried foods are harder to digest and may make you feel nauseous or bloated.  Likewise meals heavy in dairy and vegetables can cause extra gas to form and this can also increase the bloating.  Drink plenty of fluids but you should avoid alcoholic beverages for 24 hours.  ACTIVITY: Your care partner should take you home directly after the procedure.  You should plan to take it easy, moving slowly for the rest of the day.  You can resume normal activity the day after the procedure however you should NOT DRIVE or use heavy machinery for 24 hours (because of the sedation medicines used during the test).    SYMPTOMS TO  REPORT IMMEDIATELY: A gastroenterologist can be reached at any hour.  During normal business hours, 8:30 AM to 5:00 PM Monday through Friday, call [phone].  After hours and on weekends, please call the GI answering service at [phone] who will take a message and have the physician on call contact you.   Following lower endoscopy (colonoscopy or flexible sigmoidoscopy):  Excessive amounts of blood in the stool  Significant tenderness or worsening of abdominal pains  Swelling of the abdomen that is new, acute  Fever of 100F or higher  FOLLOW UP: If any biopsies were taken you will be contacted by phone or by letter within the next 1-3 weeks.  Call your gastroenterologist if you have not heard about the biopsies in 3 weeks.  Our staff will call the home number listed on your records the next business day following your procedure to check on you and address any questions or concerns that you may have at that time regarding the information given to you following your procedure. This is a courtesy call and so if there is no answer at the home number and we have not heard from you through the emergency physician on call, we will assume that you have returned to your regular daily activities without incident.  SIGNATURES/CONFIDENTIALITY: You and/or your care partner have signed paperwork which will be entered into your electronic medical record.  These signatures attest to the fact that that the information above on your After Visit   Summary has been reviewed and is understood.  Full responsibility of the confidentiality of this discharge information lies with you and/or your care-partner.  

## 2011-10-03 ENCOUNTER — Telehealth: Payer: Self-pay

## 2011-10-03 NOTE — Telephone Encounter (Signed)
  Follow up Call-  Call back number 10/02/2011  Post procedure Call Back phone  # [phone]  Permission to leave phone message Yes     Patient questions:  Do you have a fever, pain , or abdominal swelling? no Pain Score  0 *  Have you tolerated food without any problems? yes  Have you been able to return to your normal activities? yes  Do you have any questions about your discharge instructions: Diet   no Medications  no Follow up visit  no  Do you have questions or concerns about your Care? no  Actions: * If pain score is 4 or above: No action needed, pain <4.

## 2011-10-07 ENCOUNTER — Encounter: Payer: Self-pay | Admitting: Internal Medicine

## 2011-11-10 ENCOUNTER — Other Ambulatory Visit: Payer: Self-pay

## 2011-11-10 NOTE — Telephone Encounter (Signed)
[employer] [phone]) to order the patients Viagra.  Order number is # [id number] and will be processed and sent on November 14, 2011 and takes 7 to 10 business days to arrive at our office.  Called the patient (left detailed message) to inform order has been placed.

## 2011-11-12 ENCOUNTER — Encounter (HOSPITAL_COMMUNITY): Payer: Self-pay | Admitting: Emergency Medicine

## 2011-11-12 ENCOUNTER — Emergency Department (HOSPITAL_COMMUNITY): Payer: Medicare PPO

## 2011-11-12 ENCOUNTER — Emergency Department (HOSPITAL_COMMUNITY)
Admission: EM | Admit: 2011-11-12 | Discharge: 2011-11-12 | Disposition: A | Discharge status: Home / Self Care | Payer: Medicare PPO | Attending: Emergency Medicine | Admitting: Emergency Medicine

## 2011-11-12 DIAGNOSIS — J449 Chronic obstructive pulmonary disease, unspecified: Secondary | ICD-10-CM | POA: Insufficient documentation

## 2011-11-12 DIAGNOSIS — R0789 Other chest pain: Secondary | ICD-10-CM

## 2011-11-12 DIAGNOSIS — I2510 Atherosclerotic heart disease of native coronary artery without angina pectoris: Secondary | ICD-10-CM | POA: Insufficient documentation

## 2011-11-12 DIAGNOSIS — E785 Hyperlipidemia, unspecified: Secondary | ICD-10-CM | POA: Insufficient documentation

## 2011-11-12 DIAGNOSIS — N259 Disorder resulting from impaired renal tubular function, unspecified: Secondary | ICD-10-CM | POA: Insufficient documentation

## 2011-11-12 DIAGNOSIS — D573 Sickle-cell trait: Secondary | ICD-10-CM | POA: Insufficient documentation

## 2011-11-12 DIAGNOSIS — K219 Gastro-esophageal reflux disease without esophagitis: Secondary | ICD-10-CM | POA: Insufficient documentation

## 2011-11-12 DIAGNOSIS — Z79899 Other long term (current) drug therapy: Secondary | ICD-10-CM | POA: Insufficient documentation

## 2011-11-12 DIAGNOSIS — A4902 Methicillin resistant Staphylococcus aureus infection, unspecified site: Secondary | ICD-10-CM | POA: Insufficient documentation

## 2011-11-12 DIAGNOSIS — I120 Hypertensive chronic kidney disease with stage 5 chronic kidney disease or end stage renal disease: Secondary | ICD-10-CM | POA: Insufficient documentation

## 2011-11-12 DIAGNOSIS — J4489 Other specified chronic obstructive pulmonary disease: Secondary | ICD-10-CM | POA: Insufficient documentation

## 2011-11-12 DIAGNOSIS — N529 Male erectile dysfunction, unspecified: Secondary | ICD-10-CM | POA: Insufficient documentation

## 2011-11-12 DIAGNOSIS — Z87891 Personal history of nicotine dependence: Secondary | ICD-10-CM | POA: Insufficient documentation

## 2011-11-12 LAB — CBC
MCH: 30.5 pg (ref 26.0–34.0)
MCV: 88 fL (ref 78.0–100.0)
Platelets: 252 10*3/uL (ref 150–400)
RDW: 12.2 % (ref 11.5–15.5)

## 2011-11-12 LAB — PRO B NATRIURETIC PEPTIDE: Pro B Natriuretic peptide (BNP): 46.2 pg/mL (ref 0–125)

## 2011-11-12 LAB — BASIC METABOLIC PANEL
Calcium: 9.1 mg/dL (ref 8.4–10.5)
Creatinine, Ser: 1.69 mg/dL — ABNORMAL HIGH (ref 0.50–1.35)
GFR calc Af Amer: 45 mL/min — ABNORMAL LOW (ref >90)

## 2011-11-12 LAB — TROPONIN I: Troponin I: <0.3 ng/mL (ref <0.30)

## 2011-11-12 MED ORDER — FAMOTIDINE 20 MG PO TABS: 20.0000 mg | ORAL_TABLET | Freq: Two times a day (BID) | ORAL | Status: DC

## 2011-11-12 MED ORDER — FAMOTIDINE 20 MG PO TABS
20.0000 mg | ORAL_TABLET | Freq: Once | ORAL | Status: AC
  Administered 2011-11-12: 20 mg via ORAL
  Filled 2011-11-12: qty 1

## 2011-11-12 MED ORDER — SUCRALFATE 1 G PO TABS: 1.0000 g | ORAL_TABLET | Freq: Four times a day (QID) | ORAL | Status: DC

## 2011-11-12 MED ORDER — GI COCKTAIL ~~LOC~~
30.0000 mL | Freq: Once | ORAL | Status: AC
  Administered 2011-11-12: 30 mL via ORAL
  Filled 2011-11-12: qty 30

## 2011-11-12 MED ORDER — SODIUM CHLORIDE 0.9 % IV SOLN: Freq: Once | INTRAVENOUS | Status: DC

## 2011-11-12 NOTE — ED Provider Notes (Signed)
History     CSN: [id number]  Arrival date & time 11/12/11  0131   First MD Initiated Contact with Patient 11/12/11 0149      Chief Complaint  Patient presents with  . Chest Pain    (Consider location/radiation/quality/duration/timing/severity/associated sxs/prior treatment) HPI 71 year old male presents to emergency department with complaint of feeling of pressure and fullness in his chest. This has been ongoing constantly since Saturday. Patient reports on Friday he had a steroid injection to his left knee. That evening he developed hiccups, and had hiccups all throughout Saturday and Sunday. On Monday, he was seen back at the [place], and was started on a medicine which stopped his hiccups. Patient denies previous history of GERD or heartburn. Patient took albuterol and Pepto-Bismol at home without improvement. He denies diaphoresis, nausea, radiation of pain, or shortness of breath. No prior history of coronary disease. Patient has history of hypertension and hyperlipidemia. He denies any leg swelling. No prior history of PE or DVT.  Past Medical History  Diagnosis Date  . ALCOHOL ABUSE 08/24/2009  . CAROTID ARTERY DISEASE 08/30/2009  . CELLULITIS AND ABSCESS OF LEG EXCEPT FOOT 11/27/2009  . COPD 08/24/2009  . CORONARY ARTERY DISEASE 08/24/2009  . 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 GOUT 08/24/2009  . HEPATITIS C 08/24/2009  . HYPERLIPIDEMIA 08/24/2009  . HYPERTENSION 08/24/2009  . KNEE PAIN 02/14/2010  . MRSA 08/24/2009  . PERIPHERAL VASCULAR DISEASE 08/24/2009  . Preventative health care 08/09/2010  . RENAL INSUFFICIENCY 08/24/2009  . SICKLE CELL TRAIT 08/24/2009  . CKD (chronic kidney disease) 02/06/2011  . GERD (gastroesophageal reflux disease) 02/06/2011  . BPH (benign prostatic hyperplasia) 02/06/2011  . Degenerative joint disease 08/10/2011    Past Surgical History  Procedure Date  . Knee surgury     arthoscopic on the right, Dr. Madelon Lips    Family History  Problem Relation Age of Onset   . Alcohol abuse Mother   . Alcohol abuse Father   . Colon cancer Neg Hx     History  Substance Use Topics  . Smoking status: Former Games developer  . Smokeless tobacco: Not on file  . Alcohol Use: No      Review of Systems  All other systems reviewed and are negative.    Allergies  Review of patient's allergies indicates no known allergies.  Home Medications   Current Outpatient Rx  Name Route Sig Dispense Refill  . ALLOPURINOL 100 MG PO TABS Oral Take 100 mg by mouth daily as needed. Gout    . ATORVASTATIN CALCIUM 10 MG PO TABS Oral Take 1 tablet (10 mg total) by mouth daily. 90 tablet 3  . CELEBREX 100 MG PO CAPS Oral Take 100 mg by mouth daily.     Marland Kitchen FLUTICASONE PROPIONATE  HFA 110 MCG/ACT IN AERO Inhalation Inhale 1 puff into the lungs 2 (two) times daily. 1 Inhaler 12  . HYDROCODONE-ACETAMINOPHEN 5-325 MG PO TABS Oral Take 1 tablet by mouth every 6 (six) hours as needed. Right knee    . IPRATROPIUM-ALBUTEROL 20-100 MCG/ACT IN AERS Inhalation Inhale 1 puff into the lungs every 6 (six) hours as needed for wheezing. 4 g 11  . OMEPRAZOLE 20 MG PO CPDR Oral Take 1 capsule (20 mg total) by mouth 2 (two) times daily. 180 capsule 3  . SILDENAFIL CITRATE 100 MG PO TABS Oral Take 1 tablet (100 mg total) by mouth as needed for erectile dysfunction. 30 tablet 5  . TAMSULOSIN HCL 0.4 MG PO CAPS Oral Take  1 capsule (0.4 mg total) by mouth daily. 90 capsule 3  . TRAMADOL HCL 50 MG PO TABS Oral Take 50 mg by mouth every 8 (eight) hours as needed.       BP 166/77  Pulse 65  Temp 98.3 F (36.8 C) (Oral)  Resp 13  SpO2 99%  Physical Exam  Nursing note and vitals reviewed. Constitutional: He is oriented to person, place, and time. He appears well-developed and well-nourished.  HENT:  Head: Normocephalic and atraumatic.  Right Ear: External ear normal.  Left Ear: External ear normal.  Nose: Nose normal.  Mouth/Throat: Oropharynx is clear and moist.  Eyes: Conjunctivae normal and EOM  are normal. Pupils are equal, round, and reactive to light.  Neck: Normal range of motion. Neck supple. No JVD present. No tracheal deviation present. No thyromegaly present.  Cardiovascular: Normal rate, normal heart sounds and intact distal pulses.  Exam reveals no gallop and no friction rub.   No murmur heard.      Occasional PVC noted  Pulmonary/Chest: Effort normal and breath sounds normal. No stridor. No respiratory distress. He has no wheezes. He has no rales. He exhibits no tenderness.  Abdominal: Soft. Bowel sounds are normal. He exhibits no distension and no mass. There is no tenderness. There is no rebound and no guarding.  Musculoskeletal: Normal range of motion. He exhibits no edema and no tenderness.  Lymphadenopathy:    He has no cervical adenopathy.  Neurological: He is alert and oriented to person, place, and time. He has normal reflexes. No cranial nerve deficit. He exhibits normal muscle tone. Coordination normal.  Skin: Skin is warm and dry. No rash noted. No erythema. No pallor.  Psychiatric: He has a normal mood and affect. His behavior is normal. Judgment and thought content normal.    ED Course  Procedures (including critical care time)  Labs Reviewed  BASIC METABOLIC PANEL - Abnormal; Notable for the following:    Glucose, Bld 110 (*)     BUN 30 (*)     Creatinine, Ser 1.69 (*)     GFR calc non Af Amer 39 (*)     GFR calc Af Amer 45 (*)     All other components within normal limits  D-DIMER, QUANTITATIVE - Abnormal; Notable for the following:    D-Dimer, Quant 0.60 (*)     All other components within normal limits  CBC  TROPONIN I  PRO B NATRIURETIC PEPTIDE   Dg Chest 2 View  11/12/2011  *RADIOLOGY REPORT*  Clinical Data: Chest pain  CHEST - 2 VIEW  Comparison: 06/11/2010, 03/04/2008 CT, 03/02/2008 radiograph.  Findings: Calcified granuloma left upper lobe. Nodular opacity projecting over each lower lobe corresponds to nipple shadow when correlated with the  2010 radiograph with nipple markers. Cardiomediastinal contours are unchanged, with mild right hilar prominence. No pleural effusion or pneumothorax.  No confluent airspace opacity.  Multilevel degenerative changes.  No acute osseous finding.  IMPRESSION: No radiographic evidence of acute cardiopulmonary process.   Original Report Authenticated By: Waneta Martins, M.D.     Date: 11/12/2011  Rate: 65  Rhythm: normal sinus rhythm and premature atrial contractions (PAC)  QRS Axis: normal  Intervals: normal  ST/T Wave abnormalities: normal  Conduction Disutrbances:none  Narrative Interpretation: PAC new  Old EKG Reviewed: changes noted   Date: 11/12/2011  Rate:68  Rhythm: normal sinus rhythm  QRS Axis: normal  Intervals: normal  ST/T Wave abnormalities: normal  Conduction Disutrbances:none  Narrative Interpretation: PAC resolved  Old EKG Reviewed: changes noted     1. Chest discomfort       MDM  71 year old male with fullness in his chest after hiccuping for 3 days. Patient does have risk factors for coronary disease, but has EKG without ischemic changes, and negative troponin with ongoing pain for 3 days. D-dimer is slightly elevated above normal, but do not feel this is indicative of a PE given recent literature for new cut offs for d-dimer at 1.0 for PE.  Pt reports complete resolution of pressure after GI cocktail.  Suspect symptoms are due to 2 reflux given recent prolonged hiccuping. Will treat with Pepcid and Carafate and follow up with his primary care Dr.        Olivia Mackie, MD 11/12/11 [phone]

## 2011-11-12 NOTE — ED Notes (Signed)
Pt states right sided chest pain since Friday, after getting a Cortosone injection.  Pt states ShOB with chest pain, denies n/v, history emphysema.

## 2011-11-12 NOTE — ED Notes (Signed)
MD at bedside speaking with patient.

## 2012-01-01 ENCOUNTER — Encounter: Payer: Self-pay | Admitting: Internal Medicine

## 2012-01-01 ENCOUNTER — Other Ambulatory Visit (INDEPENDENT_AMBULATORY_CARE_PROVIDER_SITE_OTHER): Payer: Medicare PPO

## 2012-01-01 ENCOUNTER — Ambulatory Visit (INDEPENDENT_AMBULATORY_CARE_PROVIDER_SITE_OTHER): Payer: Medicare PPO | Admitting: Internal Medicine

## 2012-01-01 ENCOUNTER — Ambulatory Visit (INDEPENDENT_AMBULATORY_CARE_PROVIDER_SITE_OTHER)
Admission: RE | Admit: 2012-01-01 | Discharge: 2012-01-01 | Disposition: A | Discharge status: Home / Self Care | Payer: Medicare PPO | Source: Ambulatory Visit | Attending: Internal Medicine | Admitting: Internal Medicine

## 2012-01-01 VITALS — BP 146/92 | HR 87 | Temp 96.7°F | Ht 73.5 in | Wt 192.0 lb

## 2012-01-01 DIAGNOSIS — M7989 Other specified soft tissue disorders: Secondary | ICD-10-CM

## 2012-01-01 DIAGNOSIS — M79643 Pain in unspecified hand: Secondary | ICD-10-CM

## 2012-01-01 DIAGNOSIS — M79609 Pain in unspecified limb: Secondary | ICD-10-CM

## 2012-01-01 MED ORDER — METHYLPREDNISOLONE ACETATE 80 MG/ML IJ SUSP
120.0000 mg | Freq: Once | INTRAMUSCULAR | Status: AC
  Administered 2012-01-01: 120 mg via INTRAMUSCULAR

## 2012-01-01 MED ORDER — PREDNISONE 20 MG PO TABS: 20.0000 mg | ORAL_TABLET | Freq: Every day | ORAL | Status: DC

## 2012-01-01 NOTE — Progress Notes (Signed)
Subjective:    Patient ID: Alan Franco, male    DOB: [birth year], 71 y.o.   MRN: [id number]  HPI  Pt presents to the clinic today with c/o of a swollen right hand x 4 days. It started with pain and swelling in his right thumb, then slowly progressed to swelling of his right hand. He denies any injury to the area. He does have a history of gout. He has been taking his allopurinol for the past 2 days. He does work as a Curator and states that it is possible that he may have a piece of metal stuck in his hand. He has been taking ibuprofen for the pain and swelling.  Review of Systems  Past Medical History  Diagnosis Date  . ALCOHOL ABUSE 08/24/2009  . CAROTID ARTERY DISEASE 08/30/2009  . CELLULITIS AND ABSCESS OF LEG EXCEPT FOOT 11/27/2009  . COPD 08/24/2009  . CORONARY ARTERY DISEASE 08/24/2009  . 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 GOUT 08/24/2009  . HEPATITIS C 08/24/2009  . HYPERLIPIDEMIA 08/24/2009  . HYPERTENSION 08/24/2009  . KNEE PAIN 02/14/2010  . MRSA 08/24/2009  . PERIPHERAL VASCULAR DISEASE 08/24/2009  . Preventative health care 08/09/2010  . RENAL INSUFFICIENCY 08/24/2009  . SICKLE CELL TRAIT 08/24/2009  . CKD (chronic kidney disease) 02/06/2011  . GERD (gastroesophageal reflux disease) 02/06/2011  . BPH (benign prostatic hyperplasia) 02/06/2011  . Degenerative joint disease 08/10/2011    Current Outpatient Prescriptions  Medication Sig Dispense Refill  . allopurinol (ZYLOPRIM) 100 MG tablet Take 100 mg by mouth daily as needed. Gout      . atorvastatin (LIPITOR) 10 MG tablet Take 1 tablet (10 mg total) by mouth daily.  90 tablet  3  . CELEBREX 100 MG capsule Take 100 mg by mouth daily.       . famotidine (PEPCID) 20 MG tablet Take 1 tablet (20 mg total) by mouth 2 (two) times daily.  30 tablet  0  . fluticasone (FLOVENT HFA) 110 MCG/ACT inhaler Inhale 1 puff into the lungs 2 (two) times daily.  1 Inhaler  12  . HYDROcodone-acetaminophen (NORCO/VICODIN) 5-325 MG per tablet Take 1  tablet by mouth every 6 (six) hours as needed. Right knee      . Ipratropium-Albuterol (COMBIVENT RESPIMAT) 20-100 MCG/ACT AERS respimat Inhale 1 puff into the lungs every 6 (six) hours as needed for wheezing.  4 g  11  . omeprazole (PRILOSEC) 20 MG capsule Take 1 capsule (20 mg total) by mouth 2 (two) times daily.  180 capsule  3  . sildenafil (VIAGRA) 100 MG tablet Take 1 tablet (100 mg total) by mouth as needed for erectile dysfunction.  30 tablet  5  . sucralfate (CARAFATE) 1 G tablet Take 1 tablet (1 g total) by mouth 4 (four) times daily.  30 tablet  0  . Tamsulosin HCl (FLOMAX) 0.4 MG CAPS Take 1 capsule (0.4 mg total) by mouth daily.  90 capsule  3  . traMADol (ULTRAM) 50 MG tablet Take 50 mg by mouth every 8 (eight) hours as needed.         No Known Allergies  Family History  Problem Relation Age of Onset  . Alcohol abuse Mother   . Alcohol abuse Father   . Colon cancer Neg Hx     History   Social History  . Marital Status: Divorced    Spouse Name: N/A    Number of Children: 10  . Years of Education: N/A   Occupational History  .  retired former Airline pilot    Social History Main Topics  . Smoking status: Former Games developer  . Smokeless tobacco: Not on file  . Alcohol Use: No  . Drug Use: No  . Sexually Active: Not on file   Other Topics Concern  . Not on file   Social History Narrative   Lives with fiance     Constitutional: Denies fever, malaise, fatigue, headache or abrupt weight changes.  Respiratory: Denies difficulty breathing, shortness of breath, cough or sputum production.   Musculoskeletal: Pt reports swelling of the right hand. Denies decrease in range of motion, difficulty with gait, muscle pain or joint pain.  Skin: Denies redness, rashes, lesions or ulcercations.  Neurological: Denies numbness or tingling in the hands, dizziness, difficulty with memory, difficulty with speech or problems with balance and coordination.   No other specific  complaints in a complete review of systems (except as listed in HPI above).     Objective:   Physical Exam  BP 146/92  Pulse 87  Temp 96.7 F (35.9 C) (Oral)  Ht 6' 1.5" (1.867 m)  Wt 192 lb (87.091 kg)  BMI 24.99 kg/m2  SpO2 97% Wt Readings from Last 3 Encounters:  01/01/12 192 lb (87.091 kg)  10/02/11 188 lb (85.276 kg)  09/19/11 188 lb (85.276 kg)    General: Appears his stated age, well developed, well nourished in NAD. Skin: Warm, dry and intact. No rashes, lesions or ulcerations noted. Cardiovascular: Normal rate and rhythm. S1,S2 noted.  No murmur, rubs or gallops noted. No JVD or BLE edema. No carotid bruits noted. Pulmonary/Chest: Normal effort and positive vesicular breath sounds. No respiratory distress. No wheezes, rales or ronchi noted. . Musculoskeletal: 3+ swelling of the right hand, warmth noted on the right thumb but no redness.No difficulty with gait.  Neurological: Alert and oriented. Cranial nerves II-XII intact. Coordination normal. +DTRs bilaterally.        Assessment & Plan:   Right hand swelling, new onset with additional workup required:  SoluMedrol 120 mg IM now eRx for prednisone 20 mg daily x 7 days Xray of rifht hand to r/o foreign object (metal) Uric acid level to r/o gout  RTC as needed or if symptoms persist

## 2012-01-01 NOTE — Addendum Note (Signed)
Addended by: Brenton Grills C on: 01/01/2012 03:00 PM   Modules accepted: Orders

## 2012-01-05 ENCOUNTER — Telehealth: Payer: Self-pay | Admitting: Internal Medicine

## 2012-01-05 NOTE — Telephone Encounter (Signed)
[name]: Voris/Patient; Phone: [phone]; Reason for Call: Patient calling, needs to get the Viagra 100 ordered from the company.  Just call him when it arrives and he will come by to pick up .

## 2012-01-05 NOTE — Telephone Encounter (Signed)
Returning call, also requesting status of meds

## 2012-01-05 NOTE — Telephone Encounter (Signed)
Called the patient informed of lab results per [facility] and instructions Also placed order for patients viagra. [employer] [phone], order will be shipped on 01/16/12 takes 7 to 10 days to arrive at our office.  Viagra 100 mg #30 order #[id number].  Patient informed of order and understands all information.

## 2012-01-06 NOTE — Telephone Encounter (Signed)
Will call the patient when Viagra arrives.  Patient is aware of process.

## 2012-02-06 ENCOUNTER — Encounter: Payer: Self-pay | Admitting: Internal Medicine

## 2012-02-06 ENCOUNTER — Ambulatory Visit (INDEPENDENT_AMBULATORY_CARE_PROVIDER_SITE_OTHER): Payer: Medicare PPO | Admitting: Internal Medicine

## 2012-02-06 VITALS — BP 140/78 | HR 82 | Wt 192.0 lb

## 2012-02-06 DIAGNOSIS — M79609 Pain in unspecified limb: Secondary | ICD-10-CM

## 2012-02-06 DIAGNOSIS — J209 Acute bronchitis, unspecified: Secondary | ICD-10-CM

## 2012-02-06 DIAGNOSIS — M79643 Pain in unspecified hand: Secondary | ICD-10-CM

## 2012-02-06 DIAGNOSIS — M7989 Other specified soft tissue disorders: Secondary | ICD-10-CM

## 2012-02-06 DIAGNOSIS — I10 Essential (primary) hypertension: Secondary | ICD-10-CM

## 2012-02-06 DIAGNOSIS — J441 Chronic obstructive pulmonary disease with (acute) exacerbation: Secondary | ICD-10-CM | POA: Insufficient documentation

## 2012-02-06 MED ORDER — PREDNISONE 20 MG PO TABS
20.0000 mg | ORAL_TABLET | Freq: Every day | ORAL | Status: DC
Start: 2012-02-06 — End: 2012-07-19

## 2012-02-06 MED ORDER — METHYLPREDNISOLONE ACETATE 80 MG/ML IJ SUSP
120.0000 mg | Freq: Once | INTRAMUSCULAR | Status: AC
  Administered 2012-02-06: 120 mg via INTRAMUSCULAR

## 2012-02-06 MED ORDER — AZITHROMYCIN 250 MG PO TABS: ORAL_TABLET | ORAL | Status: DC

## 2012-02-06 NOTE — Assessment & Plan Note (Signed)
Mild to mod, for depomedrol IM, predpacka course,  And gave sample advair for 2 wks asd, to f/u any worsening symptoms or concerns

## 2012-02-06 NOTE — Assessment & Plan Note (Signed)
stable overall by history and exam, recent data reviewed with pt, and pt to continue medical treatment as before,  to f/u any worsening symptoms or concerns BP Readings from Last 3 Encounters:  02/06/12 140/78  01/01/12 146/92  11/12/11 157/84

## 2012-02-06 NOTE — Patient Instructions (Addendum)
You had the steroid shot today for "copd exacerbation" Please take all new medication as prescribed - the antibiotic, and prednisone Please use the Advair 250/50 twice per day for 2 wks (when the sample runs out) Please call if you feel you need the advair after the sample runs out Please continue all other medications as before, including the inhaler from the [place] as needed (the combivent) Please return in 6 months, or sooner if needed

## 2012-02-06 NOTE — Progress Notes (Signed)
Subjective:    Patient ID: Alan Franco, male    DOB: [birth year], 72 y.o.   MRN: [id number]  HPI  Here to f/u COPD, Pt denies chest pain, orthopnea, PND, increased LE swelling, palpitations, dizziness or syncope, but has ongoing chronic sob/doe though able to do ADL's and all activities, has combivent respimat that he doesn't really like to use but has 3 mo supply at home from the [place], recalls he did well for some time a couple of yrs ago with what sounds like flovent, and states has 1 wk especially increased sob/wheeze, with ? Feverish but nonprod cough.   Pt denies polydipsia, polyuria,.  Pt denies new neurological symptoms such as new headache, or facial or extremity weakness or numbness Past Medical History  Diagnosis Date  . ALCOHOL ABUSE 08/24/2009  . CAROTID ARTERY DISEASE 08/30/2009  . CELLULITIS AND ABSCESS OF LEG EXCEPT FOOT 11/27/2009  . COPD 08/24/2009  . CORONARY ARTERY DISEASE 08/24/2009  . 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 GOUT 08/24/2009  . HEPATITIS C 08/24/2009  . HYPERLIPIDEMIA 08/24/2009  . HYPERTENSION 08/24/2009  . KNEE PAIN 02/14/2010  . MRSA 08/24/2009  . PERIPHERAL VASCULAR DISEASE 08/24/2009  . Preventative health care 08/09/2010  . RENAL INSUFFICIENCY 08/24/2009  . SICKLE CELL TRAIT 08/24/2009  . CKD (chronic kidney disease) 02/06/2011  . GERD (gastroesophageal reflux disease) 02/06/2011  . BPH (benign prostatic hyperplasia) 02/06/2011  . Degenerative joint disease 08/10/2011   Past Surgical History  Procedure Date  . Knee surgury     arthoscopic on the right, Dr. Madelon Lips    reports that he has quit smoking. He does not have any smokeless tobacco history on file. He reports that he does not drink alcohol or use illicit drugs. family history includes Alcohol abuse in his father and mother.  There is no history of Colon cancer. No Known Allergies Current Outpatient Prescriptions on File Prior to Visit  Medication Sig Dispense Refill  . allopurinol (ZYLOPRIM) 100 MG tablet  Take 100 mg by mouth daily as needed. Gout      . atorvastatin (LIPITOR) 10 MG tablet Take 1 tablet (10 mg total) by mouth daily.  90 tablet  3  . CELEBREX 100 MG capsule Take 100 mg by mouth daily.       . famotidine (PEPCID) 20 MG tablet Take 1 tablet (20 mg total) by mouth 2 (two) times daily.  30 tablet  0  . fluticasone (FLOVENT HFA) 110 MCG/ACT inhaler Inhale 1 puff into the lungs 2 (two) times daily.  1 Inhaler  12  . HYDROcodone-acetaminophen (NORCO/VICODIN) 5-325 MG per tablet Take 1 tablet by mouth every 6 (six) hours as needed. Right knee      . Ipratropium-Albuterol (COMBIVENT RESPIMAT) 20-100 MCG/ACT AERS respimat Inhale 1 puff into the lungs every 6 (six) hours as needed for wheezing.  4 g  11  . omeprazole (PRILOSEC) 20 MG capsule Take 1 capsule (20 mg total) by mouth 2 (two) times daily.  180 capsule  3  . sildenafil (VIAGRA) 100 MG tablet Take 1 tablet (100 mg total) by mouth as needed for erectile dysfunction.  30 tablet  5  . sucralfate (CARAFATE) 1 G tablet Take 1 tablet (1 g total) by mouth 4 (four) times daily.  30 tablet  0  . Tamsulosin HCl (FLOMAX) 0.4 MG CAPS Take 1 capsule (0.4 mg total) by mouth daily.  90 capsule  3  . traMADol (ULTRAM) 50 MG tablet Take 50 mg by mouth  every 8 (eight) hours as needed.        Review of Systems  Constitutional: Negative for unexpected weight change, or unusual diaphoresis  HENT: Negative for tinnitus.   Eyes: Negative for photophobia and visual disturbance.  Respiratory: Negative for choking and stridor.   Gastrointestinal: Negative for vomiting and blood in stool.  Genitourinary: Negative for hematuria and decreased urine volume.  Musculoskeletal: Negative for acute joint swelling Skin: Negative for color change and wound.  Neurological: Negative for tremors and numbness other than noted  Psychiatric/Behavioral: Negative for decreased concentration or  hyperactivity.       Objective:   Physical Exam BP 140/78  Pulse 82  Wt  192 lb (87.091 kg)  SpO2 95% VS noted,  Constitutional: Pt appears well-developed and well-nourished.  HENT: Head: NCAT.  Right Ear: External ear normal.  Left Ear: External ear normal.  Eyes: Conjunctivae and EOM are normal. Pupils are equal, round, and reactive to light.  Neck: Normal range of motion. Neck supple.  Cardiovascular: Normal rate and regular rhythm.   Pulmonary/Chest: Effort normal and breath sounds decreased with few bilat wheezes  Neurological: Pt is alert. Not confused  Skin: Skin is warm. No erythema.  Psychiatric: Pt behavior is normal. Thought content normal.     Assessment & Plan:

## 2012-02-06 NOTE — Assessment & Plan Note (Signed)
Mild to mod, for antibx course,  to f/u any worsening symptoms or concerns 

## 2012-02-24 ENCOUNTER — Ambulatory Visit (INDEPENDENT_AMBULATORY_CARE_PROVIDER_SITE_OTHER): Payer: Medicare HMO | Admitting: Family Medicine

## 2012-02-24 VITALS — BP 138/85 | HR 76 | Temp 97.7°F | Resp 16 | Ht 73.5 in | Wt 185.0 lb

## 2012-02-24 DIAGNOSIS — Z0289 Encounter for other administrative examinations: Secondary | ICD-10-CM

## 2012-02-24 DIAGNOSIS — Z0000 Encounter for general adult medical examination without abnormal findings: Secondary | ICD-10-CM

## 2012-02-24 NOTE — Progress Notes (Signed)
Commercial Driver Medical Examination   Alan Franco is a 72 y.o. male who presents today for a commercial driver fitness determination physical exam. The patient reports no problems. The following portions of the patient's history were reviewed and updated as appropriate: allergies, current medications, past family history, past medical history, past social history, past surgical history and problem list. has an inhaler he uses rarely Review of Systems A comprehensive review of systems was negative.   Objective:    Vision:  Uncorrected Corrected Horizontal Field of Vision  Right Eye unable to measure 20/13 85 degrees  Left Eye  unable to measure 20/70 85 degrees  Both Eyes  unable to measure 20/13    Applicant can recognize and distinguish among traffic control signals and devices showing standard red, green, and amber colors.  Applicant meets visual acuity requirement only when wearing corrective lenses.  Monocular Vision?: No   Hearing:   500 Hz 1000 Hz 2000 Hz 4000 Hz  Right Ear  NA NA NA NA  Left Ear  NA NA NA NA  Passes whisper test from 10 feet bilaterally.    BP 138/85  Pulse 76  Temp 97.7 F (36.5 C) (Oral)  Resp 16  Ht 6' 1.5" (1.867 m)  Wt 185 lb (83.915 kg)  BMI 24.08 kg/m2  SpO2 100%  General Appearance:    Alert, cooperative, no distress, appears stated age  Head:    Normocephalic, without obvious abnormality, atraumatic  Eyes:    PERRL, conjunctiva/corneas clear, EOM's intact, fundi    benign, both eyes       Ears:    Normal TM's and external ear canals, both ears  Nose:   Nares normal, septum midline, mucosa normal, no drainage    or sinus tenderness  Throat:   Lips, mucosa, and tongue normal; teeth and gums normal  Neck:   Supple, symmetrical, trachea midline, no adenopathy;       thyroid:  No enlargement/tenderness/nodules; no carotid   bruit or JVD  Back:     Symmetric, no curvature, ROM normal, no CVA tenderness  Lungs:     Clear to  auscultation bilaterally, respirations unlabored  Chest wall:    No tenderness or deformity  Heart:    Regular rate and rhythm, S1 and S2 normal, no murmur, rub   or gallop  Abdomen:     Soft, non-tender, bowel sounds active all four quadrants,    no masses, no organomegaly  Genitalia:    Normal male without lesion, discharge or tenderness     Extremities:   Extremities normal, atraumatic, no cyanosis or edema  Pulses:   2+ and symmetric all extremities  Skin:   Skin color, texture, turgor normal, no rashes or lesions  Lymph nodes:   Cervical, supraclavicular, and axillary nodes normal  Neurologic:   CNII-XII intact. Normal strength, sensation and reflexes      throughout    Labs: Lab Results  Component Value Date   PROTEINUR NEGATIVE 04/07/2009   BILIRUBINUR NEGATIVE 08/06/2011   GLUCOSEU NEGATIVE 04/07/2009      Assessment:    Healthy male exam.  Meets standards, but periodic monitoring required due to HTN, COPD, CAD.  Driver qualified only for 1 year.    Plan:    Medical examiners certificate completed and printed. Return as needed.

## 2012-02-26 ENCOUNTER — Encounter: Payer: Self-pay | Admitting: Family Medicine

## 2012-03-23 ENCOUNTER — Telehealth: Payer: Self-pay | Admitting: Internal Medicine

## 2012-03-23 NOTE — Telephone Encounter (Signed)
[name]: Alan Franco/Patient; Phone: [phone]; Reason for Call: Patient calling to get his medication request sent to the pharmaceutical company, Viagra 100mg  tabs.  Please let him know when this arrives at the office please. He also asks that he be called to confirm that this order has been placed.

## 2012-03-24 NOTE — Telephone Encounter (Signed)
Called the patient to inform I have called [employer] at [phone] to place order for Viagra.  The patients ID number is when placing order #[id number].  The order for Viagra 100 mg #30 will be processed on March 27, 2012 and will take 7 to 10 business days to arrive at our office.  Once at our office we will call the patient to pickup his medication.  The order number is [id number].  The patient has been informed of all above information.

## 2012-05-25 ENCOUNTER — Telehealth: Payer: Self-pay | Admitting: Internal Medicine

## 2012-05-25 NOTE — Telephone Encounter (Signed)
Pt calle req Viagra 100mg  to be order through the company. Please call pt

## 2012-05-25 NOTE — Telephone Encounter (Signed)
Called [facility] at [phone] to place order.  The order will be processed on 05/31/2012 and take 7 to 10 business days to arrive at our office.  At that time the patient will be contacted to pickup his prescription.  Viagra 100 mg #30, order #[id number].  The patient has been informed order has been placed.

## 2012-07-19 ENCOUNTER — Encounter: Payer: Self-pay | Admitting: Internal Medicine

## 2012-07-19 ENCOUNTER — Ambulatory Visit (INDEPENDENT_AMBULATORY_CARE_PROVIDER_SITE_OTHER): Payer: Medicare PPO | Admitting: Internal Medicine

## 2012-07-19 VITALS — BP 120/78 | HR 88 | Temp 97.5°F | Ht 73.0 in | Wt 192.1 lb

## 2012-07-19 DIAGNOSIS — I10 Essential (primary) hypertension: Secondary | ICD-10-CM

## 2012-07-19 DIAGNOSIS — H538 Other visual disturbances: Secondary | ICD-10-CM

## 2012-07-19 DIAGNOSIS — H9190 Unspecified hearing loss, unspecified ear: Secondary | ICD-10-CM

## 2012-07-19 DIAGNOSIS — J209 Acute bronchitis, unspecified: Secondary | ICD-10-CM

## 2012-07-19 DIAGNOSIS — J441 Chronic obstructive pulmonary disease with (acute) exacerbation: Secondary | ICD-10-CM

## 2012-07-19 DIAGNOSIS — H9193 Unspecified hearing loss, bilateral: Secondary | ICD-10-CM

## 2012-07-19 MED ORDER — HYDROCODONE-HOMATROPINE 5-1.5 MG/5ML PO SYRP: 5.0000 mL | ORAL_SOLUTION | Freq: Four times a day (QID) | ORAL | Status: DC | PRN

## 2012-07-19 MED ORDER — LEVOFLOXACIN 250 MG PO TABS: 250.0000 mg | ORAL_TABLET | Freq: Every day | ORAL | Status: DC

## 2012-07-19 MED ORDER — PREDNISONE 10 MG PO TABS: ORAL_TABLET | ORAL | Status: DC

## 2012-07-19 NOTE — Assessment & Plan Note (Signed)
Mild to mod, for antibx course,  to f/u any worsening symptoms or concerns 

## 2012-07-19 NOTE — Patient Instructions (Addendum)
Please take all new medication as prescribed - the antibiotic, cough medicine, and prednisone Please continue all other medications as before, including the inhaler Please have the pharmacy call with any other refills you may need. You will be contacted regarding the referral for: ENT for the hearing, and opthomology for the eyesight  Please remember to sign up for My Chart if you have not done so, as this will be important to you in the future with finding out test results, communicating by private email, and scheduling acute appointments online when needed.  Please return in 1 month for your yearly visit, or sooner if needed, with Lab testing done 3-5 days before

## 2012-07-19 NOTE — Assessment & Plan Note (Signed)
Mild , for low dose short course predpack,  to f/u any worsening symptoms or concerns

## 2012-07-19 NOTE — Progress Notes (Signed)
Subjective:    Patient ID: Alan Franco, male    DOB: 05-Feb-1940, 72 y.o.   MRN: [id number]  HPI  Here with acute onset mild to mod 5 days ST, HA, general weakness and malaise, with prod cough greenish sputum, but Pt denies chest pain, increased sob or doe, wheezing, orthopnea, PND, increased LE swelling, palpitations, dizziness or syncope, excpet for some mild wheezing better with inhaler. No hemoptysis, non smoker.   Also asks for ent referral for hearing loss, and optho with occas blurry vision, thinks both may be related to his [employer], although he has been denied for this by the [place].   Past Medical History  Diagnosis Date  . ALCOHOL ABUSE 08/24/2009  . CAROTID ARTERY DISEASE 08/30/2009  . CELLULITIS AND ABSCESS OF LEG EXCEPT FOOT 11/27/2009  . COPD 08/24/2009  . CORONARY ARTERY DISEASE 08/24/2009  . 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 GOUT 08/24/2009  . HEPATITIS C 08/24/2009  . HYPERLIPIDEMIA 08/24/2009  . HYPERTENSION 08/24/2009  . KNEE PAIN 02/14/2010  . MRSA 08/24/2009  . PERIPHERAL VASCULAR DISEASE 08/24/2009  . Preventative health care 08/09/2010  . RENAL INSUFFICIENCY 08/24/2009  . SICKLE CELL TRAIT 08/24/2009  . CKD (chronic kidney disease) 02/06/2011  . GERD (gastroesophageal reflux disease) 02/06/2011  . BPH (benign prostatic hyperplasia) 02/06/2011  . Degenerative joint disease 08/10/2011   Past Surgical History  Procedure Laterality Date  . Knee surgury      arthoscopic on the right, Dr. Madelon Lips    reports that he has quit smoking. He has never used smokeless tobacco. He reports that he does not drink alcohol or use illicit drugs. family history includes Alcohol abuse in his father and mother.  There is no history of Colon cancer. No Known Allergies Current Outpatient Prescriptions on File Prior to Visit  Medication Sig Dispense Refill  . Ipratropium-Albuterol (COMBIVENT RESPIMAT) 20-100 MCG/ACT AERS respimat Inhale 1 puff into the lungs every 6 (six) hours as needed for  wheezing.  4 g  11  . predniSONE (DELTASONE) 20 MG tablet Take 1 tablet (20 mg total) by mouth daily.  7 tablet  0  . sildenafil (VIAGRA) 100 MG tablet Take 1 tablet (100 mg total) by mouth as needed for erectile dysfunction.  30 tablet  5  . sucralfate (CARAFATE) 1 G tablet Take 1 tablet (1 g total) by mouth 4 (four) times daily.  30 tablet  0  . Tamsulosin HCl (FLOMAX) 0.4 MG CAPS Take 1 capsule (0.4 mg total) by mouth daily.  90 capsule  3  . traMADol (ULTRAM) 50 MG tablet Take 50 mg by mouth every 8 (eight) hours as needed.       Marland Kitchen allopurinol (ZYLOPRIM) 100 MG tablet Take 100 mg by mouth daily as needed. Gout      . atorvastatin (LIPITOR) 10 MG tablet Take 1 tablet (10 mg total) by mouth daily.  90 tablet  3  . CELEBREX 100 MG capsule Take 100 mg by mouth daily.       . famotidine (PEPCID) 20 MG tablet Take 1 tablet (20 mg total) by mouth 2 (two) times daily.  30 tablet  0  . fluticasone (FLOVENT HFA) 110 MCG/ACT inhaler Inhale 1 puff into the lungs 2 (two) times daily.  1 Inhaler  12  . HYDROcodone-acetaminophen (NORCO/VICODIN) 5-325 MG per tablet Take 1 tablet by mouth every 6 (six) hours as needed. Right knee      . omeprazole (PRILOSEC) 20 MG capsule Take 1 capsule (  20 mg total) by mouth 2 (two) times daily.  180 capsule  3   No current facility-administered medications on file prior to visit.    Review of Systems  Constitutional: Negative for unexpected weight change, or unusual diaphoresis  HENT: Negative for tinnitus.   Eyes: Negative for photophobia and visual disturbance.  Respiratory: Negative for choking and stridor.   Gastrointestinal: Negative for vomiting and blood in stool.  Genitourinary: Negative for hematuria and decreased urine volume.  Musculoskeletal: Negative for acute joint swelling Skin: Negative for color change and wound.  Neurological: Negative for tremors and numbness other than noted  Psychiatric/Behavioral: Negative for decreased concentration or   hyperactivity.       Objective:   Physical Exam BP 120/78  Pulse 88  Temp(Src) 97.5 F (36.4 C) (Oral)  Ht 6\' 1"  (1.854 m)  Wt 192 lb 2 oz (87.147 kg)  BMI 25.35 kg/m2  SpO2 96% VS noted, mild ill Constitutional: Pt appears well-developed and well-nourished.  HENT: Head: NCAT.  Right Ear: External ear normal.  Left Ear: External ear normal.  Bilat tm's with mild erythema.  Max sinus areas non tender.  Pharynx with mild erythema, no exudate Eyes: Conjunctivae and EOM are normal. Pupils are equal, round, and reactive to light.  Neck: Normal range of motion. Neck supple.  Cardiovascular: Normal rate and regular rhythm.   Pulmonary/Chest: Effort normal and breath sounds decreased bilat with few wheezes, no rales.  Neurological: Pt is alert. Not confused  Skin: Skin is warm. No erythema.  Psychiatric: Pt behavior is normal. Thought content normal.     Assessment & Plan:

## 2012-07-19 NOTE — Assessment & Plan Note (Signed)
stable overall by history and exam, recent data reviewed with pt, and pt to continue medical treatment as before,  to f/u any worsening symptoms or concerns BP Readings from Last 3 Encounters:  07/19/12 120/78  02/24/12 138/85  02/06/12 140/78

## 2012-07-20 ENCOUNTER — Telehealth: Payer: Self-pay

## 2012-07-20 NOTE — Telephone Encounter (Signed)
Called [facility] at [phone] to order the patients Viagra 100 mg #30 count UJ#[id number].  The order has been completed, earliest refill date is July 31, 2012 to be shipped and will take 7 to 10 business days to arrive at our office.  The order number is [id number].  The patient is aware that order has been placed and he will be contacted by our office when medication arrives to be picked up.

## 2012-07-28 ENCOUNTER — Emergency Department (HOSPITAL_COMMUNITY)
Admission: EM | Admit: 2012-07-28 | Discharge: 2012-07-28 | Disposition: A | Discharge status: Home / Self Care | Payer: Medicare PPO | Attending: Emergency Medicine | Admitting: Emergency Medicine

## 2012-07-28 ENCOUNTER — Encounter (HOSPITAL_COMMUNITY): Payer: Self-pay | Admitting: Family Medicine

## 2012-07-28 DIAGNOSIS — Z8614 Personal history of Methicillin resistant Staphylococcus aureus infection: Secondary | ICD-10-CM | POA: Insufficient documentation

## 2012-07-28 DIAGNOSIS — Z8619 Personal history of other infectious and parasitic diseases: Secondary | ICD-10-CM | POA: Insufficient documentation

## 2012-07-28 DIAGNOSIS — I129 Hypertensive chronic kidney disease with stage 1 through stage 4 chronic kidney disease, or unspecified chronic kidney disease: Secondary | ICD-10-CM | POA: Insufficient documentation

## 2012-07-28 DIAGNOSIS — Z79899 Other long term (current) drug therapy: Secondary | ICD-10-CM | POA: Insufficient documentation

## 2012-07-28 DIAGNOSIS — R51 Headache: Secondary | ICD-10-CM | POA: Insufficient documentation

## 2012-07-28 DIAGNOSIS — K219 Gastro-esophageal reflux disease without esophagitis: Secondary | ICD-10-CM | POA: Insufficient documentation

## 2012-07-28 DIAGNOSIS — Z862 Personal history of diseases of the blood and blood-forming organs and certain disorders involving the immune mechanism: Secondary | ICD-10-CM | POA: Insufficient documentation

## 2012-07-28 DIAGNOSIS — I2510 Atherosclerotic heart disease of native coronary artery without angina pectoris: Secondary | ICD-10-CM | POA: Insufficient documentation

## 2012-07-28 DIAGNOSIS — R234 Changes in skin texture: Secondary | ICD-10-CM

## 2012-07-28 DIAGNOSIS — B86 Scabies: Secondary | ICD-10-CM | POA: Insufficient documentation

## 2012-07-28 DIAGNOSIS — E785 Hyperlipidemia, unspecified: Secondary | ICD-10-CM | POA: Insufficient documentation

## 2012-07-28 DIAGNOSIS — N400 Enlarged prostate without lower urinary tract symptoms: Secondary | ICD-10-CM | POA: Insufficient documentation

## 2012-07-28 DIAGNOSIS — Z8739 Personal history of other diseases of the musculoskeletal system and connective tissue: Secondary | ICD-10-CM | POA: Insufficient documentation

## 2012-07-28 DIAGNOSIS — Y929 Unspecified place or not applicable: Secondary | ICD-10-CM | POA: Insufficient documentation

## 2012-07-28 DIAGNOSIS — N189 Chronic kidney disease, unspecified: Secondary | ICD-10-CM | POA: Insufficient documentation

## 2012-07-28 DIAGNOSIS — J4489 Other specified chronic obstructive pulmonary disease: Secondary | ICD-10-CM | POA: Insufficient documentation

## 2012-07-28 DIAGNOSIS — I739 Peripheral vascular disease, unspecified: Secondary | ICD-10-CM | POA: Insufficient documentation

## 2012-07-28 DIAGNOSIS — Z872 Personal history of diseases of the skin and subcutaneous tissue: Secondary | ICD-10-CM | POA: Insufficient documentation

## 2012-07-28 DIAGNOSIS — IMO0002 Reserved for concepts with insufficient information to code with codable children: Secondary | ICD-10-CM | POA: Insufficient documentation

## 2012-07-28 DIAGNOSIS — Z87448 Personal history of other diseases of urinary system: Secondary | ICD-10-CM | POA: Insufficient documentation

## 2012-07-28 DIAGNOSIS — Y939 Activity, unspecified: Secondary | ICD-10-CM | POA: Insufficient documentation

## 2012-07-28 DIAGNOSIS — Z8679 Personal history of other diseases of the circulatory system: Secondary | ICD-10-CM | POA: Insufficient documentation

## 2012-07-28 DIAGNOSIS — Z87891 Personal history of nicotine dependence: Secondary | ICD-10-CM | POA: Insufficient documentation

## 2012-07-28 DIAGNOSIS — X58XXXA Exposure to other specified factors, initial encounter: Secondary | ICD-10-CM | POA: Insufficient documentation

## 2012-07-28 DIAGNOSIS — J449 Chronic obstructive pulmonary disease, unspecified: Secondary | ICD-10-CM | POA: Insufficient documentation

## 2012-07-28 NOTE — ED Notes (Signed)
Pt here for spots on the top of his head. sts he wants to make sure its not a tumor.

## 2012-07-28 NOTE — ED Notes (Signed)
No noticeable reddened area for patient's head.   I did see a very small area that is a possible scab, but hard to tell.

## 2012-07-28 NOTE — ED Provider Notes (Signed)
History  This chart was scribed for non-physician practitioner working with [name], MD by Greggory Stallion, ED scribe. This patient was seen in room TR05C/TR05C and the patient's care was started at 3:10 PM.  CSN: [id number] Arrival date & time 07/28/12  1454   Chief Complaint  Patient presents with  . Abrasion   The history is provided by the patient. No language interpreter was used.    HPI Comments: Alan Franco is a 72 y.o. male who presents to the Emergency Department complaining of an abrasion to the top of his head that he noticed yesterday. Pt states he has noticed some pink discoloration and it's itchy.  Sx worse after clipping or combing his hair.  Pt states he has been having an associated dull headache-- no associated photophobia, phonophobia, aura, visual disturbance, trouble concentrating, confusion, tinnitus, or changes in speech. He states he wants to make sure it's not a tumor.  Denies any prior dx of skin cancer.  Hx of MRSA.  No recent bug bites.  No fevers, sweats, or chills.   No meds taken for headache.  Past Medical History  Diagnosis Date  . ALCOHOL ABUSE 08/24/2009  . CAROTID ARTERY DISEASE 08/30/2009  . CELLULITIS AND ABSCESS OF LEG EXCEPT FOOT 11/27/2009  . COPD 08/24/2009  . CORONARY ARTERY DISEASE 08/24/2009  . 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 GOUT 08/24/2009  . HEPATITIS C 08/24/2009  . HYPERLIPIDEMIA 08/24/2009  . HYPERTENSION 08/24/2009  . KNEE PAIN 02/14/2010  . MRSA 08/24/2009  . PERIPHERAL VASCULAR DISEASE 08/24/2009  . Preventative health care 08/09/2010  . RENAL INSUFFICIENCY 08/24/2009  . SICKLE CELL TRAIT 08/24/2009  . CKD (chronic kidney disease) 02/06/2011  . GERD (gastroesophageal reflux disease) 02/06/2011  . BPH (benign prostatic hyperplasia) 02/06/2011  . Degenerative joint disease 08/10/2011   Past Surgical History  Procedure Laterality Date  . Knee surgury      arthoscopic on the right, Dr. Madelon Lips   Family History  Problem Relation  Age of Onset  . Alcohol abuse Mother   . Alcohol abuse Father   . Colon cancer Neg Hx    History  Substance Use Topics  . Smoking status: Former Games developer  . Smokeless tobacco: Never Used  . Alcohol Use: No    Review of Systems  Skin: Positive for color change (scalp). Negative for rash.  Neurological: Positive for headaches.  All other systems reviewed and are negative.    Allergies  Review of patient's allergies indicates no known allergies.  Home Medications   Current Outpatient Rx  Name  Route  Sig  Dispense  Refill  . EXPIRED: allopurinol (ZYLOPRIM) 100 MG tablet   Oral   Take 100 mg by mouth daily as needed. Gout         . EXPIRED: atorvastatin (LIPITOR) 10 MG tablet   Oral   Take 1 tablet (10 mg total) by mouth daily.   90 tablet   3   . CELEBREX 100 MG capsule   Oral   Take 100 mg by mouth daily.          . famotidine (PEPCID) 20 MG tablet   Oral   Take 1 tablet (20 mg total) by mouth 2 (two) times daily.   30 tablet   0   . fluticasone (FLOVENT HFA) 110 MCG/ACT inhaler   Inhalation   Inhale 1 puff into the lungs 2 (two) times daily.   1 Inhaler   12   . HYDROcodone-acetaminophen (NORCO/VICODIN)  5-325 MG per tablet   Oral   Take 1 tablet by mouth every 6 (six) hours as needed. Right knee         . HYDROcodone-homatropine (HYCODAN) 5-1.5 MG/5ML syrup   Oral   Take 5 mLs by mouth every 6 (six) hours as needed for cough.   120 mL   0   . Ipratropium-Albuterol (COMBIVENT RESPIMAT) 20-100 MCG/ACT AERS respimat   Inhalation   Inhale 1 puff into the lungs every 6 (six) hours as needed for wheezing.   4 g   11   . levofloxacin (LEVAQUIN) 250 MG tablet   Oral   Take 1 tablet (250 mg total) by mouth daily.   7 tablet   0   . EXPIRED: omeprazole (PRILOSEC) 20 MG capsule   Oral   Take 1 capsule (20 mg total) by mouth 2 (two) times daily.   180 capsule   3   . predniSONE (DELTASONE) 10 MG tablet      2 tabs per day for 5 days   10  tablet   0   . sildenafil (VIAGRA) 100 MG tablet   Oral   Take 1 tablet (100 mg total) by mouth as needed for erectile dysfunction.   30 tablet   5   . sucralfate (CARAFATE) 1 G tablet   Oral   Take 1 tablet (1 g total) by mouth 4 (four) times daily.   30 tablet   0   . Tamsulosin HCl (FLOMAX) 0.4 MG CAPS   Oral   Take 1 capsule (0.4 mg total) by mouth daily.   90 capsule   3   . traMADol (ULTRAM) 50 MG tablet   Oral   Take 50 mg by mouth every 8 (eight) hours as needed.           BP 171/89  Pulse 89  Temp(Src) 98.3 F (36.8 C)  Resp 18  SpO2 98%  Physical Exam  Nursing note and vitals reviewed. Constitutional: He is oriented to person, place, and time. He appears well-developed and well-nourished.  HENT:  Head: Normocephalic and atraumatic.  Eyes: Conjunctivae and EOM are normal. Pupils are equal, round, and reactive to light.  Neck: Normal range of motion. Neck supple.  Cardiovascular: Normal rate, regular rhythm and normal heart sounds.   Pulmonary/Chest: Effort normal and breath sounds normal.  Musculoskeletal: Normal range of motion.  Neurological: He is alert and oriented to person, place, and time. He has normal strength. No cranial nerve deficit or sensory deficit.  CN grossly intact, moves all extremities appropriately without ataxia, no focal neuro deficits or facial droop appreciated, normal gait  Skin: Skin is warm and dry.  3-4 small scabs on top of scalp, no active bleeding; small area of mild whitish scalp discoloration  Psychiatric: He has a normal mood and affect.    ED Course  Procedures (including critical care time)  DIAGNOSTIC STUDIES: Oxygen Saturation is 98% on RA, normal by my interpretation.    COORDINATION OF CARE: 3:18 PM-Discussed treatment plan which includes referral to dermatologist with pt at bedside and pt agreed to plan.   Labs Reviewed - No data to display No results found.  1. Scab     MDM   Headache without  associated neuro deficits-- doubt ICH, SAH, TIA, stroke, or meningitis.  3-4 small scabs on top of scalp-- possibly from scratching vs clipping hair.  White discoloration is mild and does not appear overly concerning.  Pt referred to  dermatologist for FU.  Discussed plan with pt, he agreed.  Return precautions advised.   I personally performed the services described in this documentation, which was scribed in my presence. The recorded information has been reviewed and is accurate.   Garlon Hatchet, PA-C 07/28/12 1542

## 2012-07-31 NOTE — ED Provider Notes (Signed)
Medical screening examination/treatment/procedure(s) were performed by non-physician practitioner and as supervising physician I was immediately available for consultation/collaboration.   Bralyn Folkert W Juliya Magill, MD 07/31/12 1527 

## 2012-10-07 ENCOUNTER — Telehealth: Payer: Self-pay

## 2012-10-07 NOTE — Telephone Encounter (Signed)
Would need OV if might need controlled substance for tx  Can consider seeing regina

## 2012-10-07 NOTE — Telephone Encounter (Signed)
The patient is having knee pain and has not been able to get his knee surgery. Does he need OV? Call back number [phone]

## 2012-10-08 ENCOUNTER — Telehealth: Payer: Self-pay | Admitting: Internal Medicine

## 2012-10-08 DIAGNOSIS — M25569 Pain in unspecified knee: Secondary | ICD-10-CM

## 2012-10-08 NOTE — Telephone Encounter (Signed)
Referral done

## 2012-10-08 NOTE — Telephone Encounter (Signed)
Patient informed. 

## 2012-10-08 NOTE — Telephone Encounter (Signed)
Patient refused appointment, he is requesting a referral to an orthopedic doctor

## 2012-10-12 ENCOUNTER — Telehealth: Payer: Self-pay | Admitting: Internal Medicine

## 2012-10-12 NOTE — Telephone Encounter (Signed)
Received Viagra through patient assistance, patient informed will send his wife to pick up his medication

## 2012-11-02 ENCOUNTER — Ambulatory Visit (INDEPENDENT_AMBULATORY_CARE_PROVIDER_SITE_OTHER): Payer: Medicare PPO | Admitting: Internal Medicine

## 2012-11-02 ENCOUNTER — Other Ambulatory Visit (INDEPENDENT_AMBULATORY_CARE_PROVIDER_SITE_OTHER): Payer: Medicare PPO

## 2012-11-02 ENCOUNTER — Encounter: Payer: Self-pay | Admitting: Internal Medicine

## 2012-11-02 VITALS — BP 140/90 | HR 72 | Temp 97.0°F | Ht 73.0 in | Wt 190.1 lb

## 2012-11-02 DIAGNOSIS — Z0000 Encounter for general adult medical examination without abnormal findings: Secondary | ICD-10-CM

## 2012-11-02 DIAGNOSIS — M1710 Unilateral primary osteoarthritis, unspecified knee: Secondary | ICD-10-CM

## 2012-11-02 DIAGNOSIS — M1712 Unilateral primary osteoarthritis, left knee: Secondary | ICD-10-CM | POA: Insufficient documentation

## 2012-11-02 DIAGNOSIS — Z23 Encounter for immunization: Secondary | ICD-10-CM

## 2012-11-02 LAB — CBC WITH DIFFERENTIAL/PLATELET
Basophils Relative: 0.5 % (ref 0.0–3.0)
Eosinophils Absolute: 0.2 10*3/uL (ref 0.0–0.7)
Eosinophils Relative: 2 % (ref 0.0–5.0)
HCT: 39.6 % (ref 39.0–52.0)
Hemoglobin: 13.4 g/dL (ref 13.0–17.0)
Lymphs Abs: 2.7 10*3/uL (ref 0.7–4.0)
MCHC: 33.8 g/dL (ref 30.0–36.0)
MCV: 89.1 fl (ref 78.0–100.0)
Monocytes Absolute: 0.8 10*3/uL (ref 0.1–1.0)
Neutro Abs: 3.8 10*3/uL (ref 1.4–7.7)
RBC: 4.44 Mil/uL (ref 4.22–5.81)
WBC: 7.4 10*3/uL (ref 4.5–10.5)

## 2012-11-02 LAB — URINALYSIS, ROUTINE W REFLEX MICROSCOPIC
Bilirubin Urine: NEGATIVE
Ketones, ur: NEGATIVE
Leukocytes, UA: NEGATIVE
Specific Gravity, Urine: 1.015 (ref 1.000–1.030)
Total Protein, Urine: NEGATIVE
Urine Glucose: NEGATIVE
pH: 6 (ref 5.0–8.0)

## 2012-11-02 MED ORDER — CELECOXIB 100 MG PO CAPS: 100.0000 mg | ORAL_CAPSULE | Freq: Two times a day (BID) | ORAL | Status: DC | PRN

## 2012-11-02 MED ORDER — IPRATROPIUM-ALBUTEROL 20-100 MCG/ACT IN AERS: 1.0000 | INHALATION_SPRAY | Freq: Four times a day (QID) | RESPIRATORY_TRACT | Status: AC | PRN

## 2012-11-02 NOTE — Addendum Note (Signed)
Addended by: Scharlene Gloss B on: 11/02/2012 05:25 PM   Modules accepted: Orders

## 2012-11-02 NOTE — Patient Instructions (Signed)
You had the flu shot today Please continue all other medications as before, and refills have been done Please have the pharmacy call with any other refills you may need. Please continue your efforts at being more active, low cholesterol diet, and weight control. You are otherwise up to date with prevention measures today. Please keep your appointments with your specialists as you may have planned  Please go to the LAB in the Basement (turn left off the elevator) for the tests to be done today You will be contacted by phone if any changes need to be made immediately.  Otherwise, you will receive a letter about your results with an explanation, but please check with MyChart first.  Please remember to sign up for My Chart if you have not done so, as this will be important to you in the future with finding out test results, communicating by private email, and scheduling acute appointments online when needed.  Please return in 1 year for your yearly visit, or sooner if needed

## 2012-11-02 NOTE — Progress Notes (Signed)
Subjective:    Patient ID: Alan Franco, male    DOB: August 08, 1940, 72 y.o.   MRN: [id number]  HPI  Here for wellness and f/u;  Overall doing ok;  Pt denies CP, worsening SOB, DOE, wheezing, orthopnea, PND, worsening LE edema, palpitations, dizziness or syncope.  Pt denies neurological change such as new headache, facial or extremity weakness.  Pt denies polydipsia, polyuria, or low sugar symptoms. Pt states overall good compliance with treatment and medications, good tolerability, and has been trying to follow lower cholesterol diet.  Pt denies worsening depressive symptoms, suicidal ideation or panic. No fever, night sweats, wt loss, loss of appetite, or other constitutional symptoms.  Pt states good ability with ADL's, has low fall risk, home safety reviewed and adequate, no other significant changes in hearing or vision, and occasionally active with exercise.  Retired but cant live on soc sec, trying to go back to work, went to [employer] in [place], [place] but they were concerned that his current med list may cause a problem with getting a job when they check the medical record.  Has not taken any pain for 5 days, overall improved, asks for celebrex prn only. Due for flu shot. 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  Past Medical History  Diagnosis Date  . ALCOHOL ABUSE 08/24/2009  . CAROTID ARTERY DISEASE 08/30/2009  . CELLULITIS AND ABSCESS OF LEG EXCEPT FOOT 11/27/2009  . COPD 08/24/2009  . CORONARY ARTERY DISEASE 08/24/2009  . 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 GOUT 08/24/2009  . HEPATITIS C 08/24/2009  . HYPERLIPIDEMIA 08/24/2009  . HYPERTENSION 08/24/2009  . KNEE PAIN 02/14/2010  . MRSA 08/24/2009  . PERIPHERAL VASCULAR DISEASE 08/24/2009  . Preventative health care 08/09/2010  . RENAL INSUFFICIENCY 08/24/2009  . SICKLE CELL TRAIT 08/24/2009  . CKD (chronic kidney disease) 02/06/2011  . GERD (gastroesophageal reflux  disease) 02/06/2011  . BPH (benign prostatic hyperplasia) 02/06/2011  . Degenerative joint disease 08/10/2011   Past Surgical History  Procedure Laterality Date  . Knee surgury      arthoscopic on the right, Dr. Madelon Lips    reports that he has quit smoking. He has never used smokeless tobacco. He reports that he does not drink alcohol or use illicit drugs. family history includes Alcohol abuse in his father and mother. There is no history of Colon cancer. No Known Allergies Current Outpatient Prescriptions on File Prior to Visit  Medication Sig Dispense Refill  . CELEBREX 100 MG capsule Take 100 mg by mouth daily.       . sucralfate (CARAFATE) 1 G tablet Take 1 tablet (1 g total) by mouth 4 (four) times daily.  30 tablet  0  . Tamsulosin HCl (FLOMAX) 0.4 MG CAPS Take 1 capsule (0.4 mg total) by mouth daily.  90 capsule  3  . traMADol (ULTRAM) 50 MG tablet Take 50 mg by mouth every 8 (eight) hours as needed.       Marland Kitchen allopurinol (ZYLOPRIM) 100 MG tablet Take 100 mg by mouth daily as needed. Gout      . atorvastatin (LIPITOR) 10 MG tablet Take 1 tablet (10 mg total) by mouth daily.  90 tablet  3  . famotidine (PEPCID) 20 MG tablet Take 1 tablet (20 mg total) by mouth 2 (two) times daily.  30 tablet  0  . fluticasone (FLOVENT HFA) 110 MCG/ACT inhaler Inhale 1 puff into the lungs  2 (two) times daily.  1 Inhaler  12  . HYDROcodone-acetaminophen (NORCO/VICODIN) 5-325 MG per tablet Take 1 tablet by mouth every 6 (six) hours as needed. Right knee      . Ipratropium-Albuterol (COMBIVENT RESPIMAT) 20-100 MCG/ACT AERS respimat Inhale 1 puff into the lungs every 6 (six) hours as needed for wheezing.  4 g  11  . omeprazole (PRILOSEC) 20 MG capsule Take 1 capsule (20 mg total) by mouth 2 (two) times daily.  180 capsule  3  . sildenafil (VIAGRA) 100 MG tablet Take 1 tablet (100 mg total) by mouth as needed for erectile dysfunction.  30 tablet  5   No current facility-administered medications on file prior to  visit.   Review of Systems Constitutional: Negative for diaphoresis, activity change, appetite change or unexpected weight change.  HENT: Negative for hearing loss, ear pain, facial swelling, mouth sores and neck stiffness.   Eyes: Negative for pain, redness and visual disturbance.  Respiratory: Negative for shortness of breath and wheezing.   Cardiovascular: Negative for chest pain and palpitations.  Gastrointestinal: Negative for diarrhea, blood in stool, abdominal distention or other pain Genitourinary: Negative for hematuria, flank pain or change in urine volume.  Musculoskeletal: Negative for myalgias and joint swelling.  Skin: Negative for color change and wound.  Neurological: Negative for syncope and numbness. other than noted Hematological: Negative for adenopathy.  Psychiatric/Behavioral: Negative for hallucinations, self-injury, decreased concentration and agitation.      Objective:   Physical Exam BP 140/90  Pulse 72  Temp(Src) 97 F (36.1 C) (Oral)  Ht 6\' 1"  (1.854 m)  Wt 190 lb 2 oz (86.24 kg)  BMI 25.09 kg/m2  SpO2 96% VS noted,  Constitutional: Pt is oriented to person, place, and time. Appears well-developed and well-nourished.  Head: Normocephalic and atraumatic.  Right Ear: External ear normal.  Left Ear: External ear normal.  Nose: Nose normal.  Mouth/Throat: Oropharynx is clear and moist.  Eyes: Conjunctivae and EOM are normal. Pupils are equal, round, and reactive to light.  Neck: Normal range of motion. Neck supple. No JVD present. No tracheal deviation present.  Cardiovascular: Normal rate, regular rhythm, normal heart sounds and intact distal pulses.   Pulmonary/Chest: Effort normal and breath sounds normal.  Abdominal: Soft. Bowel sounds are normal. There is no tenderness. No HSM  Musculoskeletal: Normal range of motion. Exhibits no edema.  Lymphadenopathy:  Has no cervical adenopathy.  Neurological: Pt is alert and oriented to person, place, and  time. Pt has normal reflexes. No cranial nerve deficit.  Skin: Skin is warm and dry. No rash noted.  Psychiatric:  Has  normal mood and affect. Behavior is normal.  Left knee crepitus noted    Assessment & Plan:

## 2012-11-02 NOTE — Assessment & Plan Note (Signed)
Ok for cebrex prn due to age, hx of PUD, and need for knee pain

## 2012-11-02 NOTE — Assessment & Plan Note (Signed)

## 2012-11-03 ENCOUNTER — Encounter: Payer: Self-pay | Admitting: Internal Medicine

## 2012-11-03 LAB — HEPATIC FUNCTION PANEL
ALT: 31 U/L (ref 0–53)
Bilirubin, Direct: 0.1 mg/dL (ref 0.0–0.3)
Total Bilirubin: 0.6 mg/dL (ref 0.3–1.2)

## 2012-11-03 LAB — LIPID PANEL
Cholesterol: 174 mg/dL (ref 0–200)
LDL Cholesterol: 112 mg/dL — ABNORMAL HIGH (ref 0–99)
Total CHOL/HDL Ratio: 5
Triglycerides: 134 mg/dL (ref 0.0–149.0)
VLDL: 26.8 mg/dL (ref 0.0–40.0)

## 2012-11-03 LAB — BASIC METABOLIC PANEL
BUN: 20 mg/dL (ref 6–23)
Chloride: 104 mEq/L (ref 96–112)
Creatinine, Ser: 1.8 mg/dL — ABNORMAL HIGH (ref 0.4–1.5)
GFR: 49.34 mL/min — ABNORMAL LOW (ref >60.00)

## 2012-11-03 LAB — PSA: PSA: 1.36 ng/mL (ref 0.10–4.00)

## 2012-12-02 ENCOUNTER — Telehealth: Payer: Self-pay | Admitting: Internal Medicine

## 2012-12-02 MED ORDER — SILDENAFIL CITRATE 100 MG PO TABS: 100.0000 mg | ORAL_TABLET | ORAL | Status: DC | PRN

## 2012-12-02 NOTE — Telephone Encounter (Signed)
Patients is requesting viagra refill

## 2012-12-02 NOTE — Telephone Encounter (Signed)
Refill done.  

## 2012-12-21 ENCOUNTER — Telehealth: Payer: Self-pay

## 2012-12-21 NOTE — Telephone Encounter (Signed)
Called [facility] to place order for Viagra 100 mg #30 with 1 refill [id number].  Order will take 7 to 10 days to arrive at our office. Order placed on December 21, 2012.  Patient has been contacted that once medication arrives at our office he will be contacted to pickup medication.  Order #[id number].

## 2012-12-30 NOTE — Telephone Encounter (Signed)
Received Medication in mail today 12/30/12, Viagra 100mg  #30.  Called informed the patient prescription has arrived and can pickup at the front at convenience.  He stated his wife would pickup for him, did instruct on the need to have identification to pickup.

## 2013-02-16 ENCOUNTER — Other Ambulatory Visit: Payer: Self-pay | Admitting: *Deleted

## 2013-02-16 NOTE — Telephone Encounter (Signed)
Patient is requesting refill on viagra.

## 2013-03-10 ENCOUNTER — Telehealth: Payer: Self-pay | Admitting: Internal Medicine

## 2013-03-10 MED ORDER — SILDENAFIL CITRATE 100 MG PO TABS: 100.0000 mg | ORAL_TABLET | ORAL | Status: DC | PRN

## 2013-03-10 NOTE — Telephone Encounter (Signed)
Called [facility] at [phone] to order #30 Viagra 100 mg patient OI#[id number].  Order will be shipped to our office 7 to 10 business days from today 03/10/2013.  Order #[id number].  Patient has been notified order has been done and will be notified when order is received at our office to come and pickup medication.

## 2013-03-10 NOTE — Telephone Encounter (Signed)
Patient is calling requesting that we call [facility] to place an order for his Viagra 100 mg. Please advise.

## 2013-03-16 NOTE — Telephone Encounter (Signed)
Called the patient informed patient assistance of Viagra 100 mg #30 has arrived at our office and ready for pickup at the front.

## 2013-04-29 ENCOUNTER — Telehealth: Payer: Self-pay | Admitting: Internal Medicine

## 2013-04-29 NOTE — Telephone Encounter (Signed)
Pt wants Viagra ordered from [facility]

## 2013-05-02 NOTE — Telephone Encounter (Signed)
Called [facility] at [phone]  To place order for Viagra 100 mg #30.  EB#[id number].   Earliest for this refill will be on 05/10/2013.  Order will be shipped out on that day and take 7 to 10 days to arrive at our office.  At that time the patient will be notified order has arrived.  Order #[id number].  Patient informed Viagra has been ordered.

## 2013-05-26 ENCOUNTER — Telehealth: Payer: Self-pay | Admitting: Internal Medicine

## 2013-05-26 NOTE — Telephone Encounter (Signed)
Called [facility] at [phone],  OV#[id number] to order Viagra 100 mg #30.  Order number for this order is #[id number].  Will take 7 to 10 business days to arrive at our office.  Patient has been informed that when arrives he will be called to pickup.  Patient informed order done as requested.

## 2013-05-26 NOTE — Telephone Encounter (Signed)
Pt wants to know if his Viagra has been ordered.

## 2013-06-06 ENCOUNTER — Telehealth: Payer: Self-pay

## 2013-06-06 NOTE — Telephone Encounter (Signed)
Patient called [facility] requesting a personal call back from Dr. Jenny Reichmann assistant to discuss [facility] patient assistance. Thanks

## 2013-06-07 NOTE — Telephone Encounter (Signed)
Called the patient left a detailed message that Viagra 100 mg #30 has arrived at our office and is ready for pickup at the front.

## 2013-07-27 ENCOUNTER — Telehealth: Payer: Self-pay | Admitting: *Deleted

## 2013-07-27 ENCOUNTER — Telehealth: Payer: Self-pay

## 2013-07-27 NOTE — Telephone Encounter (Signed)
Left msg needing his viagra through [facility]...[name]

## 2013-07-27 NOTE — Telephone Encounter (Signed)
Called the patient left a detailed message of MD instructions to schedule OV with PCP or any MD available.

## 2013-07-27 NOTE — Telephone Encounter (Signed)
The patient called the GI office to schedule appointment regarding hemorrhoids, but was told could not get appointment until October.  Advise if ok to schedule with PCP first or refer to GI? Patient is having no bleeding at this time.

## 2013-07-27 NOTE — Telephone Encounter (Signed)
Ok, any md ok

## 2013-07-27 NOTE — Telephone Encounter (Signed)
Called [facility] at [phone]  UZ#[id number] ordered his Viagra 100 mg #30 count.  Medication will be shipped out on 07/29/13 and takes 7 to 10 business days to arrive at our office.  At that time the patient will be notified medication has arrived and can pickup at his convenience.  Order# [id number],  Called the patient informed order done and will notify once at our office to pickup.

## 2013-08-04 NOTE — Telephone Encounter (Signed)
Called the patient today August 04, 2013 left him a detailed message the Viagra 100 mg #30 count has arrived at our office and is ready for pickup.

## 2013-08-17 ENCOUNTER — Ambulatory Visit: Payer: Self-pay | Admitting: Internal Medicine

## 2013-08-17 DIAGNOSIS — Z0289 Encounter for other administrative examinations: Secondary | ICD-10-CM

## 2013-08-18 ENCOUNTER — Ambulatory Visit: Payer: Self-pay | Admitting: Internal Medicine

## 2013-08-24 ENCOUNTER — Telehealth: Payer: Self-pay | Admitting: Internal Medicine

## 2013-08-24 NOTE — Telephone Encounter (Signed)
Left vm for patient to call back to reschedule if not better.

## 2013-08-24 NOTE — Telephone Encounter (Signed)
Patient no showed for acute ov on 07/29.  Rescheduled for 07/30 and then cancelled.  Please advise.

## 2013-08-24 NOTE — Telephone Encounter (Signed)
Ok to offer ROV next available

## 2013-10-19 ENCOUNTER — Other Ambulatory Visit: Payer: Self-pay | Admitting: Internal Medicine

## 2013-10-31 ENCOUNTER — Telehealth: Payer: Self-pay | Admitting: Internal Medicine

## 2013-12-14 ENCOUNTER — Ambulatory Visit (INDEPENDENT_AMBULATORY_CARE_PROVIDER_SITE_OTHER): Payer: Commercial Managed Care - HMO | Admitting: Internal Medicine

## 2013-12-14 ENCOUNTER — Encounter: Payer: Self-pay | Admitting: Internal Medicine

## 2013-12-14 VITALS — BP 138/88 | HR 77 | Temp 97.5°F | Ht 73.5 in | Wt 186.0 lb

## 2013-12-14 DIAGNOSIS — R21 Rash and other nonspecific skin eruption: Secondary | ICD-10-CM

## 2013-12-14 DIAGNOSIS — Z0000 Encounter for general adult medical examination without abnormal findings: Secondary | ICD-10-CM

## 2013-12-14 DIAGNOSIS — N528 Other male erectile dysfunction: Secondary | ICD-10-CM

## 2013-12-14 DIAGNOSIS — K6289 Other specified diseases of anus and rectum: Secondary | ICD-10-CM | POA: Insufficient documentation

## 2013-12-14 DIAGNOSIS — N400 Enlarged prostate without lower urinary tract symptoms: Secondary | ICD-10-CM

## 2013-12-14 DIAGNOSIS — N529 Male erectile dysfunction, unspecified: Secondary | ICD-10-CM

## 2013-12-14 DIAGNOSIS — Z0189 Encounter for other specified special examinations: Secondary | ICD-10-CM

## 2013-12-14 MED ORDER — KETOCONAZOLE 2 % EX CREA: 1.0000 "application " | TOPICAL_CREAM | Freq: Every day | CUTANEOUS | Status: DC

## 2013-12-14 MED ORDER — TAMSULOSIN HCL 0.4 MG PO CAPS: 0.4000 mg | ORAL_CAPSULE | Freq: Every day | ORAL | Status: AC

## 2013-12-14 MED ORDER — LIDOCAINE-HYDROCORTISONE ACE 3-0.5 % RE CREA: 1.0000 | TOPICAL_CREAM | Freq: Two times a day (BID) | RECTAL | Status: DC

## 2013-12-14 MED ORDER — INDOMETHACIN 50 MG PO CAPS: 50.0000 mg | ORAL_CAPSULE | Freq: Three times a day (TID) | ORAL | Status: DC | PRN

## 2013-12-14 MED ORDER — SILDENAFIL CITRATE 100 MG PO TABS: 100.0000 mg | ORAL_TABLET | ORAL | Status: DC | PRN

## 2013-12-14 MED ORDER — KETOCONAZOLE 200 MG PO TABS: 200.0000 mg | ORAL_TABLET | Freq: Every day | ORAL | Status: DC

## 2013-12-14 NOTE — Progress Notes (Signed)
Subjective:    Patient ID: Alan Franco, male    DOB: [birth year], 73 y.o.   MRN: [id number]  HPI  Here with c/o onset 2-3 wks worsening rectal/anal with a tender lump, no bleeding. Denies worsening reflux, abd pain, dysphagia, n/v, bowel change or blood. Drives over the road, has OAB with few wetting accidents with urinary slower flow sometimes. Hx of hemorrhoid surgury x 1, and clipped x 1. Last colonoscopy 2013 with small polyp removed, small int hemorrhoid.only. Also with scaly but markedly itchy scrotal and bilat groin rash.  Asks for viagra refill for persistent ED symtpoms.  Also with mild worsening BPH symptoms with urinary slowing occas, hesitancy and some sensation of not being to completely void.  Mild symptoms started approx 2 -3 yrs ago, better with flomax, but symptoms mild enough he stopped med, but wants to restart. Past Medical History  Diagnosis Date  . ALCOHOL ABUSE 08/24/2009  . CAROTID ARTERY DISEASE 08/30/2009  . CELLULITIS AND ABSCESS OF LEG EXCEPT FOOT 11/27/2009  . COPD 08/24/2009  . CORONARY ARTERY DISEASE 08/24/2009  . 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 GOUT 08/24/2009  . HEPATITIS C 08/24/2009  . HYPERLIPIDEMIA 08/24/2009  . HYPERTENSION 08/24/2009  . KNEE PAIN 02/14/2010  . MRSA 08/24/2009  . PERIPHERAL VASCULAR DISEASE 08/24/2009  . Preventative health care 08/09/2010  . RENAL INSUFFICIENCY 08/24/2009  . SICKLE CELL TRAIT 08/24/2009  . CKD (chronic kidney disease) 02/06/2011  . GERD (gastroesophageal reflux disease) 02/06/2011  . BPH (benign prostatic hyperplasia) 02/06/2011  . Degenerative joint disease 08/10/2011   Past Surgical History  Procedure Laterality Date  . Knee surgury      arthoscopic on the right, Dr. French Ana    reports that he has quit smoking. He has never used smokeless tobacco. He reports that he does not drink alcohol or use illicit drugs. family history includes Alcohol abuse in his father and mother. There is no history of Colon cancer. No Known  Allergies Current Outpatient Prescriptions on File Prior to Visit  Medication Sig Dispense Refill  . celecoxib (CELEBREX) 100 MG capsule Take 1 capsule (100 mg total) by mouth 2 (two) times daily as needed for pain. 180 capsule 3  . Ipratropium-Albuterol (COMBIVENT RESPIMAT) 20-100 MCG/ACT AERS respimat Inhale 1 puff into the lungs every 6 (six) hours as needed for wheezing. 4 g 11  . allopurinol (ZYLOPRIM) 100 MG tablet Take 100 mg by mouth daily as needed. Gout    . atorvastatin (LIPITOR) 10 MG tablet Take 1 tablet (10 mg total) by mouth daily. 90 tablet 3  . fluticasone (FLOVENT HFA) 110 MCG/ACT inhaler Inhale 1 puff into the lungs 2 (two) times daily. 1 Inhaler 12  . omeprazole (PRILOSEC) 20 MG capsule Take 1 capsule (20 mg total) by mouth 2 (two) times daily. 180 capsule 3   No current facility-administered medications on file prior to visit.    Review of Systems  Constitutional: Negative for unusual diaphoresis or other sweats  HENT: Negative for ringing in ear Eyes: Negative for double vision or worsening visual disturbance.  Respiratory: Negative for choking and stridor.   Gastrointestinal: Negative for vomiting or other signifcant bowel change Genitourinary: Negative for hematuria or decreased urine volume.  Musculoskeletal: Negative for other MSK pain or swelling Skin: Negative for color change and worsening wound.  Neurological: Negative for tremors and numbness other than noted  Psychiatric/Behavioral: Negative for decreased concentration or agitation other than above       Objective:  Physical Exam BP 138/88 mmHg  Pulse 77  Temp(Src) 97.5 F (36.4 C) (Oral)  Ht 6' 1.5" (1.867 m)  Wt 186 lb (84.369 kg)  BMI 24.20 kg/m2  SpO2 97% VS noted,  Constitutional: Pt appears well-developed, well-nourished.  HENT: Head: NCAT.  Right Ear: External ear normal.  Left Ear: External ear normal.  Eyes: . Pupils are equal, round, and reactive to light. Conjunctivae and EOM are  normal Neck: Normal range of motion. Neck supple.  Cardiovascular: Normal rate and regular rhythm.   Pulmonary/Chest: Effort normal and breath sounds normal.  Abd:  Soft, NT, ND, + BS Anal:  + tender anal tag vs hemorrhoid thrombosed, no bleeding Neurological: Pt is alert. Not confused , motor grossly intact Skin: Skin is warm. Diffuse nontender slight scaly scrotal rash extending to bilat inguinal areas Psychiatric: Pt behavior is normal. No agitation.     Assessment & Plan:

## 2013-12-14 NOTE — Assessment & Plan Note (Signed)
C/w fungal most liekly , for ketoconozole 200 qd x 7 days, then ketocon cream prn recurrence, keep clothes dry as possible

## 2013-12-14 NOTE — Assessment & Plan Note (Signed)
?   Hemorrhoid vs tender anal tag, for GI referral, anamantle topical prn

## 2013-12-14 NOTE — Patient Instructions (Addendum)
Please take all new medication as prescribed - the cream for the anal itching, the pills for the rash for 7 days only (ketoconozole 200 mg pills), and the flomax for the prostate (long term)  You are also given the ketoconozole cream to use after the pills if the rash tries to come back  You should HOLD the Lipitor (atorvastatin) for the week that you take the ketoconozole pills  Please continue all other medications as before, and refills have been done if requested - the viagra  You will be contacted regarding the referral for: Gastroenterology  Please have the pharmacy call with any other refills you may need.  Please keep your appointments with your specialists as you may have planned  Please return in 3 months, or sooner if needed, with Lab testing done 3-5 days before

## 2013-12-14 NOTE — Assessment & Plan Note (Signed)
Mild persistent, for viagra refill,  to f/u any worsening symptoms or concerns

## 2013-12-14 NOTE — Progress Notes (Signed)
Pre visit review using our clinic review tool, if applicable. No additional management support is needed unless otherwise documented below in the visit note. 

## 2013-12-14 NOTE — Assessment & Plan Note (Signed)
[place] for flomax asd,  to f/u any worsening symptoms or concerns

## 2013-12-20 ENCOUNTER — Telehealth: Payer: Self-pay | Admitting: *Deleted

## 2013-12-20 NOTE — Telephone Encounter (Signed)
Notified pharmacy spoke with Merleen Nicely gave md response...[name]

## 2013-12-20 NOTE — Telephone Encounter (Signed)
Ok to use both in this instance, since the chance of interaction is VERY low since the ketoconozole is only a cream

## 2013-12-20 NOTE — Telephone Encounter (Signed)
Left msg on triage stating pt was rx tamulosin, also he is using the ketoconazole cream which it contraindicate when taking together. Requesting recommendation from md on what he want pt to do...[name]

## 2014-01-31 ENCOUNTER — Encounter: Payer: Self-pay | Admitting: Internal Medicine

## 2014-02-14 ENCOUNTER — Telehealth: Payer: Self-pay | Admitting: Internal Medicine

## 2014-02-14 NOTE — Telephone Encounter (Signed)
Patient Name: Alan Franco  DOB: 10-Jun-1940    Initial Comment [name] states c/o shortness of breath, dizziness   Nurse Assessment  Nurse: Leilani Merl, RN, Heather Date/Time (Eastern Time): 02/14/2014 9:41:09 AM  Confirm and document reason for call. If symptomatic, describe symptoms. ---[name] states that he took a physical yesterday and his BP was 179/63, he has COPD and he is still having diff breathing, it is not worse than normal today.  Has the patient traveled out of the country within the last 30 days? ---Not Applicable  Does the patient require triage? ---Yes  Related visit to physician within the last 2 weeks? ---No  Does the PT have any chronic conditions? (i.e. diabetes, asthma, etc.) ---Yes  List chronic conditions. ---COPD     Guidelines    Guideline Title Affirmed Question Affirmed Notes  High Blood Pressure BP ? 160/100    Final Disposition User   See PCP When Office is Open (within 3 days) Standifer, RN, [employer] states that he is not dizzy right now and the feeling that he feels is hard for him to explain. [name] states that he is out of town right now and is on his way back to [place] so he can make his appt tomorrow with Dr. Jenny Reichmann. [name] is on a bus right now. [name] just wants to make Dr. Jenny Reichmann aware of the BP that was taken yesterday at his physical.

## 2014-02-14 NOTE — Telephone Encounter (Signed)
Ok to offer pt appt at his convenience Shirlean Mylar to forward to a scheduler who is here, as Lynelle Smoke is out to [employer]

## 2014-02-15 ENCOUNTER — Encounter: Payer: Self-pay | Admitting: Internal Medicine

## 2014-02-15 ENCOUNTER — Ambulatory Visit (INDEPENDENT_AMBULATORY_CARE_PROVIDER_SITE_OTHER): Payer: Medicare HMO | Admitting: Internal Medicine

## 2014-02-15 ENCOUNTER — Telehealth: Payer: Self-pay | Admitting: *Deleted

## 2014-02-15 VITALS — BP 130/72 | HR 75 | Temp 97.8°F | Ht 72.0 in | Wt 178.5 lb

## 2014-02-15 DIAGNOSIS — R030 Elevated blood-pressure reading, without diagnosis of hypertension: Secondary | ICD-10-CM | POA: Insufficient documentation

## 2014-02-15 DIAGNOSIS — I10 Essential (primary) hypertension: Secondary | ICD-10-CM

## 2014-02-15 DIAGNOSIS — R21 Rash and other nonspecific skin eruption: Secondary | ICD-10-CM

## 2014-02-15 MED ORDER — NYSTATIN 100000 UNIT/GM EX POWD: CUTANEOUS | Status: AC

## 2014-02-15 MED ORDER — FLUCONAZOLE 100 MG PO TABS: 100.0000 mg | ORAL_TABLET | Freq: Every day | ORAL | Status: DC

## 2014-02-15 NOTE — Patient Instructions (Signed)
Your Blood Pressure was OK today  Please return each day for 2 days for Nurse visit for Blood Pressure re-check;  If ok, you would then get a letter to fax to your employer to return to work  Please take all new medication as prescribed - the powder for the rash, as well as the pills  Please continue all other medications as before, and refills have been done if requested.  Please have the pharmacy call with any other refills you may need.  Please keep your appointments with your specialists as you may have planned, except as you said you may want to cancel the GI referral if your symptoms have resolved

## 2014-02-15 NOTE — Progress Notes (Signed)
Subjective:    Patient ID: Alan Franco, male    DOB: January 29, 1940, 74 y.o.   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  Pt denies new neurological symptoms such as new headache, or facial or extremity weakness or numbness   Pt denies polydipsia, polyuria.   Pt denies fever, wt loss, night sweats, loss of appetite, or other constitutional symptoms No other current complaints except for a sour smell to groin in last 2 months with rash to scrotum not clearing up.    Past Medical History  Diagnosis Date  . ALCOHOL ABUSE 08/24/2009  . CAROTID ARTERY DISEASE 08/30/2009  . CELLULITIS AND ABSCESS OF LEG EXCEPT FOOT 11/27/2009  . COPD 08/24/2009  . CORONARY ARTERY DISEASE 08/24/2009  . 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 GOUT 08/24/2009  . HEPATITIS C 08/24/2009  . HYPERLIPIDEMIA 08/24/2009  . HYPERTENSION 08/24/2009  . KNEE PAIN 02/14/2010  . MRSA 08/24/2009  . PERIPHERAL VASCULAR DISEASE 08/24/2009  . Preventative health care 08/09/2010  . RENAL INSUFFICIENCY 08/24/2009  . SICKLE CELL TRAIT 08/24/2009  . CKD (chronic kidney disease) 02/06/2011  . GERD (gastroesophageal reflux disease) 02/06/2011  . BPH (benign prostatic hyperplasia) 02/06/2011  . Degenerative joint disease 08/10/2011   Past Surgical History  Procedure Laterality Date  . Knee surgury      arthoscopic on the right, Dr. French Ana    reports that he has quit smoking. He has never used smokeless tobacco. He reports that he does not drink alcohol or use illicit drugs. family history includes Alcohol abuse in his father and mother. There is no history of Colon cancer. No Known Allergies Current Outpatient Prescriptions on File  Prior to Visit  Medication Sig Dispense Refill  . celecoxib (CELEBREX) 100 MG capsule Take 1 capsule (100 mg total) by mouth 2 (two) times daily as needed for pain. 180 capsule 3  . indomethacin (INDOCIN) 50 MG capsule Take 1 capsule (50 mg total) by mouth 3 (three) times daily as needed. 90 capsule 3  . Ipratropium-Albuterol (COMBIVENT RESPIMAT) 20-100 MCG/ACT AERS respimat Inhale 1 puff into the lungs every 6 (six) hours as needed for wheezing. 4 g 11  . ketoconazole (NIZORAL) 2 % cream Apply 1 application topically daily. 30 g 1  . lidocaine-hydrocortisone (ANAMANTEL HC) 3-0.5 % CREA Place 1 Applicatorful rectally 2 (two) times daily. As needed only for pain and irritation 85 g 0  . sildenafil (VIAGRA) 100 MG tablet Take 1 tablet (100 mg total) by mouth as needed for erectile dysfunction. 10 tablet 11  . tamsulosin (FLOMAX) 0.4 MG CAPS capsule Take 1 capsule (0.4 mg total) by mouth daily. 90 capsule 3  . allopurinol (ZYLOPRIM) 100 MG tablet Take 100 mg by mouth daily as needed. Gout    . atorvastatin (LIPITOR) 10 MG tablet Take 1 tablet (10 mg total) by mouth daily. 90 tablet 3  . fluticasone (FLOVENT HFA) 110 MCG/ACT inhaler Inhale 1 puff into the lungs 2 (two) times daily. 1 Inhaler 12  . omeprazole (PRILOSEC) 20 MG capsule Take 1 capsule (20 mg total) by mouth 2 (two) times daily. 180 capsule 3   No current facility-administered medications on file  prior to visit.   Review of Systems  Constitutional: Negative for unusual diaphoresis or other sweats  HENT: Negative for ringing in ear Eyes: Negative for double vision or worsening visual disturbance.  Respiratory: Negative for choking and stridor.   Gastrointestinal: Negative for vomiting or other signifcant bowel change Genitourinary: Negative for hematuria or decreased urine volume.  Musculoskeletal: Negative for other MSK pain or swelling Skin: Negative for color change and worsening wound.  Neurological: Negative for tremors and  numbness other than noted  Psychiatric/Behavioral: Negative for decreased concentration or agitation other than above       Objective:   Physical Exam BP 130/72 mmHg  Pulse 75  Temp(Src) 97.8 F (36.6 C) (Oral)  Ht 6' (1.829 m)  Wt 178 lb 8 oz (80.967 kg)  BMI 24.20 kg/m2  SpO2 98% VS noted, not ill appearing Constitutional: Pt appears well-developed, well-nourished.  HENT: Head: NCAT.  Right Ear: External ear normal.  Left Ear: External ear normal.  Eyes: . Pupils are equal, round, and reactive to light. Conjunctivae and EOM are normal Neck: Normal range of motion. Neck supple.  Cardiovascular: Normal rate and regular rhythm.   Pulmonary/Chest: Effort normal and breath sounds without rales or wheezing.  Abd:  Soft, NT, ND, + BS Neurological: Pt is alert. Not confused , motor grossly intact Skin: Skin is warm. + rash to scrotum and bilat inner prox thigh white/erythem Nontender Psychiatric: Pt behavior is normal. No agitation.     Assessment & Plan:

## 2014-02-15 NOTE — Progress Notes (Signed)
Pre visit review using our clinic review tool, if applicable. No additional management support is needed unless otherwise documented below in the visit note. 

## 2014-02-15 NOTE — Telephone Encounter (Signed)
[facility] Client [facility] Patient Name: Alan Franco Gender: Male DOB: October 07, 1940 Age: 74 Y 31 M 27 D Return Phone Number: [phone] (Primary) Address: City/State/Zip: [place] Client [facility] - Client Client Site [facility] - Day Physician Cathlean Cower Contact Type Call Call Type Triage / Clinical Relationship To Patient Self Appointment Disposition EMR Appointment Not Necessary Return Phone Number [phone] (Primary) Chief Complaint BREATHING - shortness of breath or sounds breathless Initial Comment [name] states c/o shortness of breath, dizziness PreDisposition Did not know what to do Info pasted into Epic Yes Nurse Assessment Nurse: Leilani Merl, RN, Heather Date/Time (Eastern Time): 02/14/2014 9:41:09 AM Confirm and document reason for call. If symptomatic, describe symptoms. ---[name] states that he took a physical yesterday and his BP was 179/63, he has COPD and he is still having diff breathing, it is not worse than normal today. Has the patient traveled out of the country within the last 30 days? ---Not Applicable Does the patient require triage? ---Yes Related visit to physician within the last 2 weeks? ---No Does the PT have any chronic conditions? (i.e. diabetes, asthma, etc.) ---Yes List chronic conditions. ---COPD Guidelines Guideline Title Affirmed Question Affirmed Notes Nurse Date/Time (Eastern Time) High Blood Pressure BP # 160/100 Standifer, RN, Heather 02/14/2014 9:44:53 AM Disp. Time Eilene Ghazi Time) Disposition Final User 02/14/2014 9:37:43 AM Send to Urgent Rich Brave, Amy 02/14/2014 9:52:18 AM Call Completed Standifer, RN, Nira Conn 02/14/2014 9:46:53 AM See PCP When Office is Open (within 3 days) Yes Standifer, RN, Soyla Murphy Understands: Yes PLEASE NOTE: All timestamps contained within this report are represented as [place] Standard  Time. CONFIDENTIALTY NOTICE: This fax transmission is intended only for the addressee. It contains information that is legally privileged, confidential or otherwise protected from use or disclosure. If you are not the intended recipient, you are strictly prohibited from reviewing, disclosing, copying using or disseminating any of this information or taking any action in reliance on or regarding this information. If you have received this fax in error, please notify us immediately by telephone so that we can arrange for its return to [place]. Phone: [phone], Toll-Free: [phone], Fax: [phone] Page: 2 of 2 Call Id: [id number] Disagree/Comply: Comply Care Advice Given Per Guideline SEE PCP WITHIN 3 DAYS: You need to be examined within 2 or 3 days. Call your doctor during regular office hours and make an appointment. (Note: if office will be open tomorrow, tell caller to call then, not in 3 days). HIGH BLOOD PRESSURE: * Untreated high blood pressure may cause damage to your heart, brain, kidneys, and eyes. * EAT HEALTHY: Eat a diet rich in fresh fruits and vegetables, dietary fiber, non-animal protein (e.g., soy), and low-fat dairy products. Avoid foods with a high content of saturated fat or cholesterol. * DECREASE SODIUM INTAKE: Aim to eat less than 1,500 mg of sodium each day. Unfortunately 75% of the salt in the average person's diet is in pre-processed foods. LIFESTYLE MODIFICATIONS - The following things can help you reduce your blood pressure: CALL BACK IF: * Weakness or numbness of the face, arm or leg on one side of the body occurs * Difficulty walking, difficulty talking, or severe headache occurs * Chest pain or difficulty breathing occurs * Your blood pressure is over 180/110 * You become worse. CARE ADVICE given per High Blood Pressure (Adult) guideline. After Care Instructions Given Call Event Type User Date / Time Description

## 2014-02-16 ENCOUNTER — Telehealth: Payer: Self-pay | Admitting: Internal Medicine

## 2014-02-16 ENCOUNTER — Ambulatory Visit (INDEPENDENT_AMBULATORY_CARE_PROVIDER_SITE_OTHER): Payer: Medicare HMO | Admitting: *Deleted

## 2014-02-16 ENCOUNTER — Encounter: Payer: Self-pay | Admitting: *Deleted

## 2014-02-16 VITALS — BP 142/90

## 2014-02-16 DIAGNOSIS — I10 Essential (primary) hypertension: Secondary | ICD-10-CM

## 2014-02-16 NOTE — Telephone Encounter (Signed)
emmi emailed °

## 2014-02-17 ENCOUNTER — Ambulatory Visit: Payer: Medicare HMO

## 2014-02-17 VITALS — BP 140/90

## 2014-02-17 DIAGNOSIS — Z0130 Encounter for examination of blood pressure without abnormal findings: Secondary | ICD-10-CM

## 2014-02-19 NOTE — Assessment & Plan Note (Signed)
Your Blood Pressure was OK today, pt to return each day for next 2 days for Nurse visit for Blood Pressure re-checks;  If ok, you would then get a letter to fax to your employer to return to work BP Readings from Last 3 Encounters:  02/16/14 142/90  02/15/14 130/72  12/14/13 138/88

## 2014-02-19 NOTE — Assessment & Plan Note (Signed)
C/w fungal - for diflucan and nystatin asd,  to f/u any worsening symptoms or concerns

## 2014-02-27 ENCOUNTER — Telehealth: Payer: Self-pay | Admitting: Internal Medicine

## 2014-02-27 MED ORDER — ALLOPURINOL 100 MG PO TABS: 100.0000 mg | ORAL_TABLET | Freq: Every day | ORAL | Status: DC | PRN

## 2014-02-27 NOTE — Telephone Encounter (Signed)
done

## 2014-02-27 NOTE — Telephone Encounter (Signed)
Pt came in stated that he miss place Allopurinol 100 mg and he was wondering if Dr. Jenny Reichmann or any provider will be willing to send it into [facility] on [address] for him.Please call him if this is ok.

## 2014-02-27 NOTE — Telephone Encounter (Signed)
Left vm for to call back, pt request Rx to send to [facility] on [address] but we send to [facility] on [address] (need to know if this is ok or we need to change it).

## 2014-03-01 ENCOUNTER — Ambulatory Visit (INDEPENDENT_AMBULATORY_CARE_PROVIDER_SITE_OTHER): Payer: Medicare HMO | Admitting: Internal Medicine

## 2014-03-01 ENCOUNTER — Encounter: Payer: Self-pay | Admitting: Internal Medicine

## 2014-03-01 VITALS — BP 164/90 | Temp 97.4°F | Ht 72.0 in | Wt 180.0 lb

## 2014-03-01 DIAGNOSIS — I10 Essential (primary) hypertension: Secondary | ICD-10-CM

## 2014-03-01 DIAGNOSIS — R21 Rash and other nonspecific skin eruption: Secondary | ICD-10-CM

## 2014-03-01 DIAGNOSIS — J449 Chronic obstructive pulmonary disease, unspecified: Secondary | ICD-10-CM

## 2014-03-01 MED ORDER — PREDNISONE 10 MG PO TABS: ORAL_TABLET | ORAL | Status: DC

## 2014-03-01 MED ORDER — AMLODIPINE BESYLATE 5 MG PO TABS: 5.0000 mg | ORAL_TABLET | Freq: Every day | ORAL | Status: DC

## 2014-03-01 MED ORDER — METHYLPREDNISOLONE ACETATE 80 MG/ML IJ SUSP
80.0000 mg | Freq: Once | INTRAMUSCULAR | Status: AC
  Administered 2014-03-01: 80 mg via INTRAMUSCULAR

## 2014-03-01 NOTE — Progress Notes (Signed)
Pre visit review using our clinic review tool, if applicable. No additional management support is needed unless otherwise documented below in the visit note. 

## 2014-03-01 NOTE — Progress Notes (Signed)
Subjective:    Patient ID: Alan Franco, male    DOB: [birth year], 74 y.o.   MRN: [id number]  HPI  Here to f/u previous rash to torso and groin/scrotal area which had improved with diflucan/nystatin, but unfortunately now with different rash to torso, small itching red spots to torso only, seems to come and go, different pattern each day, mild recurring daily for 5 days, nothing seems to make worse, has not tried benadryl or any other tx, nothing seems to make better except for some steroid cream he has leftover at home. No fever, pain, swelling, sob or wheezing. Pt denies chest pain, increased sob or doe, wheezing, orthopnea, PND, increased LE swelling, palpitations, dizziness or syncope.  Pt denies new neurological symptoms such as new headache, or facial or extremity weakness or numbness   Pt denies polydipsia, polyuria,  Past Medical History  Diagnosis Date  . ALCOHOL ABUSE 08/24/2009  . CAROTID ARTERY DISEASE 08/30/2009  . CELLULITIS AND ABSCESS OF LEG EXCEPT FOOT 11/27/2009  . COPD 08/24/2009  . CORONARY ARTERY DISEASE 08/24/2009  . 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 GOUT 08/24/2009  . HEPATITIS C 08/24/2009  . HYPERLIPIDEMIA 08/24/2009  . HYPERTENSION 08/24/2009  . KNEE PAIN 02/14/2010  . MRSA 08/24/2009  . PERIPHERAL VASCULAR DISEASE 08/24/2009  . Preventative health care 08/09/2010  . RENAL INSUFFICIENCY 08/24/2009  . SICKLE CELL TRAIT 08/24/2009  . CKD (chronic kidney disease) 02/06/2011  . GERD (gastroesophageal reflux disease) 02/06/2011  . BPH (benign prostatic hyperplasia) 02/06/2011  . Degenerative joint disease 08/10/2011   Past Surgical History  Procedure Laterality Date  . Knee surgury      arthoscopic on the right, Dr. French Ana    reports that he has quit smoking. He has never used smokeless tobacco. He reports that he does not drink alcohol or use illicit drugs. family history includes Alcohol abuse in his father and mother. There is no history of Colon cancer. No Known  Allergies Current Outpatient Prescriptions on File Prior to Visit  Medication Sig Dispense Refill  . allopurinol (ZYLOPRIM) 100 MG tablet Take 1 tablet (100 mg total) by mouth daily as needed. Gout 30 tablet 0  . atorvastatin (LIPITOR) 10 MG tablet Take 1 tablet (10 mg total) by mouth daily. 90 tablet 3  . celecoxib (CELEBREX) 100 MG capsule Take 1 capsule (100 mg total) by mouth 2 (two) times daily as needed for pain. 180 capsule 3  . fluconazole (DIFLUCAN) 100 MG tablet Take 1 tablet (100 mg total) by mouth daily. 7 tablet 0  . fluticasone (FLOVENT HFA) 110 MCG/ACT inhaler Inhale 1 puff into the lungs 2 (two) times daily. 1 Inhaler 12  . indomethacin (INDOCIN) 50 MG capsule Take 1 capsule (50 mg total) by mouth 3 (three) times daily as needed. 90 capsule 3  . Ipratropium-Albuterol (COMBIVENT RESPIMAT) 20-100 MCG/ACT AERS respimat Inhale 1 puff into the lungs every 6 (six) hours as needed for wheezing. 4 g 11  . ketoconazole (NIZORAL) 2 % cream Apply 1 application topically daily. 30 g 1  . lidocaine-hydrocortisone (ANAMANTEL HC) 3-0.5 % CREA Place 1 Applicatorful rectally 2 (two) times daily. As needed only for pain and irritation 85 g 0  . nystatin (MYCOSTATIN/NYSTOP) 100000 UNIT/GM POWD Use as directed to affected area up to three times per day 60 g 2  . omeprazole (PRILOSEC) 20 MG capsule Take 1 capsule (20 mg total) by mouth 2 (two) times daily. 180 capsule 3  . sildenafil (VIAGRA) 100 MG tablet  Take 1 tablet (100 mg total) by mouth as needed for erectile dysfunction. 10 tablet 11  . tamsulosin (FLOMAX) 0.4 MG CAPS capsule Take 1 capsule (0.4 mg total) by mouth daily. 90 capsule 3   No current facility-administered medications on file prior to visit.   Review of Systems  Constitutional: Negative for unusual diaphoresis or other sweats  HENT: Negative for ringing in ear Eyes: Negative for double vision or worsening visual disturbance.  Respiratory: Negative for choking and stridor.    Gastrointestinal: Negative for vomiting or other signifcant bowel change Genitourinary: Negative for hematuria or decreased urine volume.  Musculoskeletal: Negative for other MSK pain or swelling Skin: Negative for color change and worsening wound.  Neurological: Negative for tremors and numbness other than noted  Psychiatric/Behavioral: Negative for decreased concentration or agitation other than above       Objective:   Physical Exam BP 164/90 mmHg  Temp(Src) 97.4 F (36.3 C) (Oral)  Ht 6' (1.829 m)  Wt 180 lb (81.647 kg)  BMI 24.41 kg/m2 VS noted, not ill appearing Constitutional: Pt appears well-developed, well-nourished.  HENT: Head: NCAT.  Right Ear: External ear normal.  Left Ear: External ear normal.  Eyes: . Pupils are equal, round, and reactive to light. Conjunctivae and EOM are normal Neck: Normal range of motion. Neck supple.  Cardiovascular: Normal rate and regular rhythm.   Pulmonary/Chest: Effort normal and breath sounds without rales or wheezing.  Abd:  Soft, NT, ND, + BS Neurological: Pt is alert. Not confused , motor grossly intact Skin: Skin is warm. Torso with tntc small pruritic macular erythema to torso only No groin rash Psychiatric: Pt behavior is normal. No agitation.   BP Readings from Last 3 Encounters:  03/01/14 164/90  02/20/14 140/90  02/16/14 142/90        Assessment & Plan:

## 2014-03-01 NOTE — Patient Instructions (Addendum)
You had the steroid shot today  Please take all new medication as prescribed - the prednisone, and the amlodipine for blood pressure  Please continue all other medications as before, and refills have been done if requested.  Please have the pharmacy call with any other refills you may need.  Please keep your appointments with your specialists as you may have planned  OK to have the [facility] cancel the Feb 24 appt  Please return in 3 months, or sooner if needed

## 2014-03-04 NOTE — Assessment & Plan Note (Signed)
Does not appear fungal this time, is c/w allergic type - for depomedrol IM, predpac asd, benadryl cream prn itchy areas

## 2014-03-04 NOTE — Assessment & Plan Note (Signed)
Worsening BP recent, for amlodipine 5 qd,  to f/u any worsening symptoms or concerns  BP Readings from Last 3 Encounters:  03/01/14 164/90  02/20/14 140/90  02/16/14 142/90

## 2014-03-04 NOTE — Assessment & Plan Note (Signed)
stable overall by history and exam, recent data reviewed with pt, and pt to continue medical treatment as before,  to f/u any worsening symptoms or concerns SpO2 Readings from Last 3 Encounters:  02/15/14 98%  12/14/13 97%  11/02/12 96%

## 2014-03-06 ENCOUNTER — Telehealth: Payer: Self-pay | Admitting: Internal Medicine

## 2014-03-06 NOTE — Telephone Encounter (Signed)
Error

## 2014-03-10 ENCOUNTER — Telehealth: Payer: Self-pay | Admitting: Internal Medicine

## 2014-03-10 NOTE — Telephone Encounter (Signed)
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 Jenny Reichmann didn't put in when he can go back to driving. Please check he said he gave it to Dr. Jenny Reichmann.

## 2014-03-14 ENCOUNTER — Ambulatory Visit: Payer: Medicare HMO | Admitting: *Deleted

## 2014-03-14 VITALS — BP 118/68

## 2014-03-14 DIAGNOSIS — I10 Essential (primary) hypertension: Secondary | ICD-10-CM

## 2014-03-14 NOTE — Telephone Encounter (Signed)
I no longer have this form, d  Does Dustin recall anything about this?

## 2014-03-15 ENCOUNTER — Ambulatory Visit (INDEPENDENT_AMBULATORY_CARE_PROVIDER_SITE_OTHER): Payer: Medicare HMO | Admitting: Internal Medicine

## 2014-03-15 ENCOUNTER — Encounter: Payer: Self-pay | Admitting: Internal Medicine

## 2014-03-15 VITALS — BP 132/84 | HR 62 | Temp 97.3°F | Resp 18 | Ht 72.0 in | Wt 178.0 lb

## 2014-03-15 DIAGNOSIS — N5089 Other specified disorders of the male genital organs: Secondary | ICD-10-CM

## 2014-03-15 DIAGNOSIS — I10 Essential (primary) hypertension: Secondary | ICD-10-CM

## 2014-03-15 DIAGNOSIS — N508 Other specified disorders of male genital organs: Secondary | ICD-10-CM

## 2014-03-15 DIAGNOSIS — R21 Rash and other nonspecific skin eruption: Secondary | ICD-10-CM

## 2014-03-15 NOTE — Patient Instructions (Addendum)
Please have Ann from your job fax [place] the paperwork involved for the Blood Pressure checks to [phone] OR  [phone]          Attn: Dr Jenny Reichmann  Please return one more time for a Blood Pressure check at a Nurse Visit tomorrow or the next day, to document 3 controlled blood pressures  You will be contacted regarding the referral for: dermatology, as well as testicle ultrasound  Please continue all other medications as before, and refills have been done if requested.  Please have the pharmacy call with any other refills you may need.  Please keep your appointments with your specialists as you may have planned

## 2014-03-15 NOTE — Progress Notes (Signed)
Subjective:    Patient ID: Alan Franco, male    DOB: [birth year], 74 y.o.   MRN: [id number]  HPI  Here to f/u; overall doing ok,  Pt denies chest pain, increased sob or doe, wheezing, orthopnea, PND, increased LE swelling, palpitations, dizziness or syncope.  Pt denies polydipsia, polyuria, or low sugar symptoms such as weakness or confusion improved with po intake.  Pt denies new neurological symptoms such as new headache, or facial or extremity weakness or numbness.   Pt states overall good compliance with meds, has been trying to follow lower cholesterol diet, with wt overall stable,  but little exercise however. BP Readings from Last 3 Encounters:  03/15/14 132/84  03/14/14 118/68  03/01/14 164/90  [facility], had RPR and HIV taken yesterday due to recent rash which persists to groin.  Also mentions a new lump to left scrotal area, nontender, not sure how long there but just noticed 2 days ago.Denies urinary symptoms such as dysuria, frequency, urgency, flank pain, hematuria or n/v, fever, chills. Past Medical History  Diagnosis Date  . ALCOHOL ABUSE 08/24/2009  . CAROTID ARTERY DISEASE 08/30/2009  . CELLULITIS AND ABSCESS OF LEG EXCEPT FOOT 11/27/2009  . COPD 08/24/2009  . CORONARY ARTERY DISEASE 08/24/2009  . 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 GOUT 08/24/2009  . HEPATITIS C 08/24/2009  . HYPERLIPIDEMIA 08/24/2009  . HYPERTENSION 08/24/2009  . KNEE PAIN 02/14/2010  . MRSA 08/24/2009  . PERIPHERAL VASCULAR DISEASE 08/24/2009  . Preventative health care 08/09/2010  . RENAL INSUFFICIENCY 08/24/2009  . SICKLE CELL TRAIT 08/24/2009  . CKD (chronic kidney disease) 02/06/2011  . GERD (gastroesophageal reflux disease) 02/06/2011  . BPH (benign prostatic hyperplasia) 02/06/2011  . Degenerative joint disease 08/10/2011   Past Surgical History  Procedure Laterality Date  . Knee surgury      arthoscopic on the right, Dr. French Ana    reports that he has quit smoking. He has never used  smokeless tobacco. He reports that he does not drink alcohol or use illicit drugs. family history includes Alcohol abuse in his father and mother. There is no history of Colon cancer. No Known Allergies Current Outpatient Prescriptions on File Prior to Visit  Medication Sig Dispense Refill  . allopurinol (ZYLOPRIM) 100 MG tablet Take 1 tablet (100 mg total) by mouth daily as needed. Gout 30 tablet 0  . atorvastatin (LIPITOR) 10 MG tablet Take 1 tablet (10 mg total) by mouth daily. 90 tablet 3  . celecoxib (CELEBREX) 100 MG capsule Take 1 capsule (100 mg total) by mouth 2 (two) times daily as needed for pain. 180 capsule 3  . fluconazole (DIFLUCAN) 100 MG tablet Take 1 tablet (100 mg total) by mouth daily. 7 tablet 0  . fluticasone (FLOVENT HFA) 110 MCG/ACT inhaler Inhale 1 puff into the lungs 2 (two) times daily. 1 Inhaler 12  . indomethacin (INDOCIN) 50 MG capsule Take 1 capsule (50 mg total) by mouth 3 (three) times daily as needed. 90 capsule 3  . Ipratropium-Albuterol (COMBIVENT RESPIMAT) 20-100 MCG/ACT AERS respimat Inhale 1 puff into the lungs every 6 (six) hours as needed for wheezing. 4 g 11  . ketoconazole (NIZORAL) 2 % cream Apply 1 application topically daily. 30 g 1  . lidocaine-hydrocortisone (ANAMANTEL HC) 3-0.5 % CREA Place 1 Applicatorful rectally 2 (two) times daily. As needed only for pain and irritation 85 g 0  . nystatin (MYCOSTATIN/NYSTOP) 100000 UNIT/GM POWD Use as directed to affected area up to three  times per day 60 g 2  . omeprazole (PRILOSEC) 20 MG capsule Take 1 capsule (20 mg total) by mouth 2 (two) times daily. 180 capsule 3  . sildenafil (VIAGRA) 100 MG tablet Take 1 tablet (100 mg total) by mouth as needed for erectile dysfunction. 10 tablet 11  . tamsulosin (FLOMAX) 0.4 MG CAPS capsule Take 1 capsule (0.4 mg total) by mouth daily. 90 capsule 3   No current facility-administered medications on file prior to visit.   Review of Systems  Constitutional: Negative for  unusual diaphoresis or other sweats  HENT: Negative for ringing in ear Eyes: Negative for double vision or worsening visual disturbance.  Respiratory: Negative for choking and stridor.   Gastrointestinal: Negative for vomiting or other signifcant bowel change Genitourinary: Negative for hematuria or decreased urine volume.  Musculoskeletal: Negative for other MSK pain or swelling Skin: Negative for color change and worsening wound.  Neurological: Negative for tremors and numbness other than noted  Psychiatric/Behavioral: Negative for decreased concentration or agitation other than above       Objective:   Physical Exam BP 132/84 mmHg  Pulse 62  Temp(Src) 97.3 F (36.3 C) (Oral)  Resp 18  Ht 6' (1.829 m)  Wt 178 lb (80.74 kg)  BMI 24.14 kg/m2  SpO2 98% VS noted,  Constitutional: Pt appears well-developed, well-nourished.  HENT: Head: NCAT.  Right Ear: External ear normal.  Left Ear: External ear normal.  Eyes: . Pupils are equal, round, and reactive to light. Conjunctivae and EOM are normal Neck: Normal range of motion. Neck supple.  Cardiovascular: Normal rate and regular rhythm.   Pulmonary/Chest: Effort normal and breath sounds without rales or wheezing.  Abd:  Soft, NT, ND, + BS Neurological: Pt is alert. Not confused , motor grossly intact Skin: Skin is warm. No rash Psychiatric: Pt behavior is normal. No agitation.  GU: left scrotal with nontender approx 1/2 cm lump above testicle       Assessment & Plan:

## 2014-03-15 NOTE — Progress Notes (Signed)
Pre visit review using our clinic review tool, if applicable. No additional management support is needed unless otherwise documented below in the visit note. 

## 2014-03-16 ENCOUNTER — Ambulatory Visit: Payer: Self-pay | Admitting: Internal Medicine

## 2014-03-16 ENCOUNTER — Encounter: Payer: Self-pay | Admitting: *Deleted

## 2014-03-16 ENCOUNTER — Ambulatory Visit: Payer: Medicare HMO | Admitting: *Deleted

## 2014-03-16 VITALS — BP 132/68

## 2014-03-16 DIAGNOSIS — I10 Essential (primary) hypertension: Secondary | ICD-10-CM

## 2014-03-17 ENCOUNTER — Ambulatory Visit
Admission: RE | Admit: 2014-03-17 | Discharge: 2014-03-17 | Disposition: A | Discharge status: Home / Self Care | Payer: Medicare HMO | Source: Ambulatory Visit | Attending: Internal Medicine | Admitting: Internal Medicine

## 2014-03-17 DIAGNOSIS — N5089 Other specified disorders of the male genital organs: Secondary | ICD-10-CM

## 2014-03-21 ENCOUNTER — Encounter: Payer: Self-pay | Admitting: Internal Medicine

## 2014-03-21 ENCOUNTER — Ambulatory Visit (INDEPENDENT_AMBULATORY_CARE_PROVIDER_SITE_OTHER): Payer: Medicare HMO | Admitting: Internal Medicine

## 2014-03-21 VITALS — BP 140/82 | HR 85 | Temp 97.7°F | Resp 18 | Ht 72.0 in | Wt 178.1 lb

## 2014-03-21 DIAGNOSIS — I10 Essential (primary) hypertension: Secondary | ICD-10-CM

## 2014-03-21 DIAGNOSIS — Z0000 Encounter for general adult medical examination without abnormal findings: Secondary | ICD-10-CM

## 2014-03-21 DIAGNOSIS — Z23 Encounter for immunization: Secondary | ICD-10-CM

## 2014-03-21 DIAGNOSIS — N5089 Other specified disorders of the male genital organs: Secondary | ICD-10-CM

## 2014-03-21 DIAGNOSIS — R21 Rash and other nonspecific skin eruption: Secondary | ICD-10-CM

## 2014-03-21 MED ORDER — TRIAMCINOLONE ACETONIDE 0.1 % EX CREA: 1.0000 "application " | TOPICAL_CREAM | Freq: Two times a day (BID) | CUTANEOUS | Status: DC

## 2014-03-21 MED ORDER — AMLODIPINE BESYLATE 10 MG PO TABS: 10.0000 mg | ORAL_TABLET | Freq: Every day | ORAL | Status: DC

## 2014-03-21 NOTE — Patient Instructions (Addendum)
You had the flu shot today  OK to increase the amlodipine to 10 mg per day (ok to take 2 ot the 5 mg pills to use them up, then start the 10 mg pill - 1 per day after that)  Please take all new medication as prescribed - the cream  You will be contacted regarding the referral for: dermatology  Please continue all other medications as before, and refills have been done if requested.  Please have the pharmacy call with any other refills you may need.  Please continue your efforts at being more active, low cholesterol diet, and weight control.  You are otherwise up to date with prevention measures today.  Please keep your appointments with your specialists as you may have planned  Please go to the LAB in the Basement (turn left off the elevator) for the tests to be done tomorrow  You will be contacted by phone if any changes need to be made immediately.  Otherwise, you will receive a letter about your results with an explanation, but please check with MyChart first.  Please remember to sign up for MyChart if you have not done so, as this will be important to you in the future with finding out test results, communicating by private email, and scheduling acute appointments online when needed.  Your form should be faxed tomorrow to your employer

## 2014-03-21 NOTE — Progress Notes (Signed)
Subjective:    Patient ID: Alan Franco, male    DOB: 11-15-1940, 74 y.o.   MRN: [id number]  HPI  Here for wellness and f/u;  Overall doing ok;  Pt denies CP, worsening SOB, DOE, wheezing, orthopnea, PND, worsening LE edema, palpitations, dizziness or syncope.  Pt denies neurological change such as new headache, facial or extremity weakness.  Pt denies polydipsia, polyuria, or low sugar symptoms. Pt states overall good compliance with treatment and medications, good tolerability, and has been trying to follow lower cholesterol diet.  Pt denies worsening depressive symptoms, suicidal ideation or panic. No fever, night sweats, wt loss, loss of appetite, or other constitutional symptoms.  Pt states good ability with ADL's, has low fall risk, home safety reviewed and adequate, no other significant changes in hearing or vision, and only occasionally active with exercise.  Still with groin rash not improved with tx to date, did seem some improved with otc cortisone. No other complaints Past Medical History  Diagnosis Date  . ALCOHOL ABUSE 08/24/2009  . CAROTID ARTERY DISEASE 08/30/2009  . CELLULITIS AND ABSCESS OF LEG EXCEPT FOOT 11/27/2009  . COPD 08/24/2009  . CORONARY ARTERY DISEASE 08/24/2009  . 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 GOUT 08/24/2009  . HEPATITIS C 08/24/2009  . HYPERLIPIDEMIA 08/24/2009  . HYPERTENSION 08/24/2009  . KNEE PAIN 02/14/2010  . MRSA 08/24/2009  . PERIPHERAL VASCULAR DISEASE 08/24/2009  . Preventative health care 08/09/2010  . RENAL INSUFFICIENCY 08/24/2009  . SICKLE CELL TRAIT 08/24/2009  . CKD (chronic kidney disease) 02/06/2011  . GERD (gastroesophageal reflux disease) 02/06/2011  . BPH (benign prostatic hyperplasia) 02/06/2011  . Degenerative joint disease 08/10/2011   Past Surgical History  Procedure Laterality Date  . Knee surgury      arthoscopic on the right, Dr. French Ana    reports that he has quit smoking. He has never used smokeless tobacco. He reports that he does not  drink alcohol or use illicit drugs. family history includes Alcohol abuse in his father and mother. There is no history of Colon cancer. No Known Allergies Current Outpatient Prescriptions on File Prior to Visit  Medication Sig Dispense Refill  . allopurinol (ZYLOPRIM) 100 MG tablet Take 1 tablet (100 mg total) by mouth daily as needed. Gout 30 tablet 0  . atorvastatin (LIPITOR) 10 MG tablet Take 1 tablet (10 mg total) by mouth daily. 90 tablet 3  . celecoxib (CELEBREX) 100 MG capsule Take 1 capsule (100 mg total) by mouth 2 (two) times daily as needed for pain. 180 capsule 3  . fluconazole (DIFLUCAN) 100 MG tablet Take 1 tablet (100 mg total) by mouth daily. 7 tablet 0  . fluticasone (FLOVENT HFA) 110 MCG/ACT inhaler Inhale 1 puff into the lungs 2 (two) times daily. 1 Inhaler 12  . indomethacin (INDOCIN) 50 MG capsule Take 1 capsule (50 mg total) by mouth 3 (three) times daily as needed. 90 capsule 3  . Ipratropium-Albuterol (COMBIVENT RESPIMAT) 20-100 MCG/ACT AERS respimat Inhale 1 puff into the lungs every 6 (six) hours as needed for wheezing. 4 g 11  . ketoconazole (NIZORAL) 2 % cream Apply 1 application topically daily. 30 g 1  . lidocaine-hydrocortisone (ANAMANTEL HC) 3-0.5 % CREA Place 1 Applicatorful rectally 2 (two) times daily. As needed only for pain and irritation 85 g 0  . nystatin (MYCOSTATIN/NYSTOP) 100000 UNIT/GM POWD Use as directed to affected area up to three times per day 60 g 2  . omeprazole (PRILOSEC) 20 MG capsule Take 1  capsule (20 mg total) by mouth 2 (two) times daily. 180 capsule 3  . sildenafil (VIAGRA) 100 MG tablet Take 1 tablet (100 mg total) by mouth as needed for erectile dysfunction. 10 tablet 11  . tamsulosin (FLOMAX) 0.4 MG CAPS capsule Take 1 capsule (0.4 mg total) by mouth daily. 90 capsule 3   No current facility-administered medications on file prior to visit.   Review of Systems Constitutional: Negative for increased diaphoresis, other activity, appetite  or other siginficant weight change  HENT: Negative for worsening hearing loss, ear pain, facial swelling, mouth sores and neck stiffness.   Eyes: Negative for other worsening pain, redness or visual disturbance.  Respiratory: Negative for shortness of breath and wheezing.   Cardiovascular: Negative for chest pain and palpitations.  Gastrointestinal: Negative for diarrhea, blood in stool, abdominal distention or other pain Genitourinary: Negative for hematuria, flank pain or change in urine volume.  Musculoskeletal: Negative for myalgias or other joint complaints.  Skin: Negative for color change and wound.  Neurological: Negative for syncope and numbness. other than noted Hematological: Negative for adenopathy. or other swelling Psychiatric/Behavioral: Negative for hallucinations, self-injury, decreased concentration or other worsening agitation.  s    Objective:   Physical Exam BP 140/82 mmHg  Pulse 85  Temp(Src) 97.7 F (36.5 C) (Oral)  Resp 18  Ht 6' (1.829 m)  Wt 178 lb 1.9 oz (80.795 kg)  BMI 24.15 kg/m2  SpO2 95% VS noted,  Constitutional: Pt is oriented to person, place, and time. Appears well-developed and well-nourished.  Head: Normocephalic and atraumatic.  Right Ear: External ear normal.  Left Ear: External ear normal.  Nose: Nose normal.  Mouth/Throat: Oropharynx is clear and moist.  Eyes: Conjunctivae and EOM are normal. Pupils are equal, round, and reactive to light.  Neck: Normal range of motion. Neck supple. No JVD present. No tracheal deviation present.  Cardiovascular: Normal rate, regular rhythm, normal heart sounds and intact distal pulses.   Pulmonary/Chest: Effort normal and breath sounds without rales or wheezing  Abdominal: Soft. Bowel sounds are normal. NT. No HSM  Musculoskeletal: Normal range of motion. Exhibits no edema.  Lymphadenopathy:  Has no cervical adenopathy.  Neurological: Pt is alert and oriented to person, place, and time. Pt has normal  reflexes. No cranial nerve deficit. Motor grossly intact Skin: Skin is warm and dry Psychiatric:  Has normal mood and affect. Behavior is normal.  BP Readings from Last 3 Encounters:  03/21/14 140/82  03/16/14 132/68  03/15/14 132/84       Assessment & Plan:

## 2014-03-21 NOTE — Progress Notes (Signed)
Pre visit review using our clinic review tool, if applicable. No additional management support is needed unless otherwise documented below in the visit note. 

## 2014-03-22 ENCOUNTER — Telehealth: Payer: Self-pay

## 2014-03-22 ENCOUNTER — Other Ambulatory Visit (INDEPENDENT_AMBULATORY_CARE_PROVIDER_SITE_OTHER): Payer: Medicare HMO

## 2014-03-22 ENCOUNTER — Encounter: Payer: Self-pay | Admitting: Internal Medicine

## 2014-03-22 DIAGNOSIS — Z0189 Encounter for other specified special examinations: Secondary | ICD-10-CM

## 2014-03-22 DIAGNOSIS — I10 Essential (primary) hypertension: Secondary | ICD-10-CM

## 2014-03-22 DIAGNOSIS — N400 Enlarged prostate without lower urinary tract symptoms: Secondary | ICD-10-CM

## 2014-03-22 DIAGNOSIS — Z0000 Encounter for general adult medical examination without abnormal findings: Secondary | ICD-10-CM

## 2014-03-22 DIAGNOSIS — E785 Hyperlipidemia, unspecified: Secondary | ICD-10-CM

## 2014-03-22 LAB — LIPID PANEL
Cholesterol: 161 mg/dL (ref 0–200)
HDL: 41.7 mg/dL (ref >39.00)
LDL Cholesterol: 103 mg/dL — ABNORMAL HIGH (ref 0–99)
NonHDL: 119.3
TRIGLYCERIDES: 81 mg/dL (ref 0.0–149.0)
Total CHOL/HDL Ratio: 4
VLDL: 16.2 mg/dL (ref 0.0–40.0)

## 2014-03-22 LAB — CBC WITH DIFFERENTIAL/PLATELET
BASOS PCT: 0.4 % (ref 0.0–3.0)
Basophils Absolute: 0 10*3/uL (ref 0.0–0.1)
Eosinophils Absolute: 0.1 10*3/uL (ref 0.0–0.7)
Eosinophils Relative: 1 % (ref 0.0–5.0)
HCT: 39.4 % (ref 39.0–52.0)
Hemoglobin: 13.5 g/dL (ref 13.0–17.0)
Lymphocytes Relative: 29.5 % (ref 12.0–46.0)
Lymphs Abs: 1.6 10*3/uL (ref 0.7–4.0)
MCHC: 34.2 g/dL (ref 30.0–36.0)
MCV: 87 fl (ref 78.0–100.0)
MONO ABS: 0.4 10*3/uL (ref 0.1–1.0)
Monocytes Relative: 7.1 % (ref 3.0–12.0)
Neutro Abs: 3.4 10*3/uL (ref 1.4–7.7)
Neutrophils Relative %: 62 % (ref 43.0–77.0)
PLATELETS: 212 10*3/uL (ref 150.0–400.0)
RBC: 4.53 Mil/uL (ref 4.22–5.81)
RDW: 12.7 % (ref 11.5–15.5)
WBC: 5.6 10*3/uL (ref 4.0–10.5)

## 2014-03-22 LAB — URINALYSIS, ROUTINE W REFLEX MICROSCOPIC
Bilirubin Urine: NEGATIVE
Ketones, ur: NEGATIVE
Leukocytes, UA: NEGATIVE
NITRITE: NEGATIVE
PH: 5.5 (ref 5.0–8.0)
Specific Gravity, Urine: 1.015 (ref 1.000–1.030)
TOTAL PROTEIN, URINE-UPE24: NEGATIVE
URINE GLUCOSE: NEGATIVE
Urobilinogen, UA: 0.2 (ref 0.0–1.0)

## 2014-03-22 LAB — HEPATIC FUNCTION PANEL
ALT: 69 U/L — AB (ref 0–53)
AST: 52 U/L — ABNORMAL HIGH (ref 0–37)
Albumin: 3.8 g/dL (ref 3.5–5.2)
Alkaline Phosphatase: 82 U/L (ref 39–117)
BILIRUBIN DIRECT: 0.1 mg/dL (ref 0.0–0.3)
Total Bilirubin: 0.4 mg/dL (ref 0.2–1.2)
Total Protein: 8.3 g/dL (ref 6.0–8.3)

## 2014-03-22 LAB — BASIC METABOLIC PANEL
BUN: 34 mg/dL — ABNORMAL HIGH (ref 6–23)
CALCIUM: 9.2 mg/dL (ref 8.4–10.5)
CO2: 25 mEq/L (ref 19–32)
Chloride: 105 mEq/L (ref 96–112)
Creatinine, Ser: 1.84 mg/dL — ABNORMAL HIGH (ref 0.40–1.50)
GFR: 46.39 mL/min — ABNORMAL LOW (ref >60.00)
Glucose, Bld: 101 mg/dL — ABNORMAL HIGH (ref 70–99)
Potassium: 4.3 mEq/L (ref 3.5–5.1)
SODIUM: 139 meq/L (ref 135–145)

## 2014-03-22 LAB — TSH: TSH: 1.89 u[IU]/mL (ref 0.35–4.50)

## 2014-03-22 LAB — PSA: PSA: 1.04 ng/mL (ref 0.10–4.00)

## 2014-03-22 NOTE — Telephone Encounter (Signed)
Pt called back with and correct fax number is [phone] Attn Elon Jester

## 2014-03-22 NOTE — Telephone Encounter (Signed)
Called patient to clarify fax number for DOT paperwork. Patient stated the forms needed to be faxed to [phone].

## 2014-03-22 NOTE — Telephone Encounter (Signed)
Paperwork faxed to correct number.

## 2014-03-23 NOTE — Assessment & Plan Note (Signed)
Ok for derm referral, also trial mild steroid cr prn, to f/u any worsening symptoms or concerns \

## 2014-03-23 NOTE — Assessment & Plan Note (Signed)
D/w pt, c/w benign cyst,  to f/u any worsening symptoms or concerns

## 2014-03-23 NOTE — Assessment & Plan Note (Signed)
Still mild uncontrolled, o/w stable overall by history and exam, recent data reviewed with pt, and pt to continue medical treatment as before,  to f/u any worsening symptoms or concerns BP Readings from Last 3 Encounters:  03/21/14 140/82  03/16/14 132/68  03/15/14 132/84   For increased amlodipine 10 qd

## 2014-03-23 NOTE — Assessment & Plan Note (Signed)

## 2014-03-24 NOTE — Assessment & Plan Note (Signed)
Persistent , will refer derm again as has not het been able to see

## 2014-03-24 NOTE — Assessment & Plan Note (Signed)
stable overall by history and exam, recent data reviewed with pt, and pt to continue medical treatment as before,  to f/u any worsening symptoms or concerns BP Readings from Last 3 Encounters:  03/21/14 140/82  03/16/14 132/68  03/15/14 132/84

## 2014-03-24 NOTE — Assessment & Plan Note (Signed)
Suspect epididymal cyst, d/w pt, for scrotal u/s to confirm, consider urology referral

## 2014-04-04 ENCOUNTER — Ambulatory Visit (INDEPENDENT_AMBULATORY_CARE_PROVIDER_SITE_OTHER): Payer: Self-pay | Admitting: Family Medicine

## 2014-04-04 VITALS — BP 138/70 | HR 75 | Temp 98.0°F | Resp 16 | Ht 71.75 in | Wt 183.0 lb

## 2014-04-04 DIAGNOSIS — Z024 Encounter for examination for driving license: Secondary | ICD-10-CM

## 2014-04-04 DIAGNOSIS — Z021 Encounter for pre-employment examination: Secondary | ICD-10-CM

## 2014-04-04 NOTE — Progress Notes (Signed)
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 He has been healthy. He is not on any regular medications. Intermittently he has had elevated blood pressure when anxious or for white coat settings. However he is not on any medicines and doing well.  Past medical history: Operations: Surgery on his right hand and arthroscopic surgery of his only operations Major medical illnesses: None Allergies: None Regular medications: None  Social history: He has a common-law wife of 86 years, 10 children, 62 her serum grandchildren and 8 or 9 great-grandchildren. He says he doesn't do well just sitting at home doing nothing, and plans to keep on driving. 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  Review of systems: Constitutional: Unremarkable HEENT: Unremarkable. He does wear reading glasses some times. Respiratory: Unremarkable Cardiovascular: Unremarkable GI: Unremarkable GU: Unremarkable Muscular skeletal: Unremarkable except he gets some leg pain when he walks too much. He says he doesn't get enough regular exercise. Dermatologic: Has some crusted skin lesions on his left leg that have just been there recently and not a major problem Endocrine: Unremarkable Neurologic: Unremarkable Psychiatric: Unremarkable  Physical exam: Pleasant alert and oriented well-developed well-nourished 74 year old man. TMs normal. Eyes PERRLA. Fundi benign. Has prominent more arcus senilis. No carotid bruits. Chest clear to all station. Heart regular without murmurs. And soft without mass or tenderness. Normal male external genitalia with testes descended. No hernias. Extremities unremarkable. Pedal pulses trace palpable bilaterally. He has a couple crusted almost resolved boil-like areas on his left thigh. Noninflamed now.  Assessment: Essentially normal physical examination, with no abnormalities for the DOT exam.  Plan: 2 year  card Continue care under his regular physician.

## 2014-04-25 ENCOUNTER — Encounter: Payer: Self-pay | Admitting: Internal Medicine

## 2014-04-25 ENCOUNTER — Ambulatory Visit (INDEPENDENT_AMBULATORY_CARE_PROVIDER_SITE_OTHER): Payer: Medicare HMO | Admitting: Internal Medicine

## 2014-04-25 VITALS — BP 142/82 | HR 77 | Temp 97.8°F | Resp 18 | Ht 71.75 in | Wt 176.1 lb

## 2014-04-25 DIAGNOSIS — F1010 Alcohol abuse, uncomplicated: Secondary | ICD-10-CM | POA: Diagnosis not present

## 2014-04-25 DIAGNOSIS — I10 Essential (primary) hypertension: Secondary | ICD-10-CM | POA: Diagnosis not present

## 2014-04-25 DIAGNOSIS — M109 Gout, unspecified: Secondary | ICD-10-CM

## 2014-04-25 MED ORDER — PREDNISONE 10 MG PO TABS: ORAL_TABLET | ORAL | Status: DC

## 2014-04-25 MED ORDER — INDOMETHACIN 50 MG PO CAPS: 50.0000 mg | ORAL_CAPSULE | Freq: Three times a day (TID) | ORAL | Status: DC | PRN

## 2014-04-25 MED ORDER — HYDROCODONE-ACETAMINOPHEN 7.5-325 MG PO TABS: 1.0000 | ORAL_TABLET | Freq: Four times a day (QID) | ORAL | Status: DC | PRN

## 2014-04-25 MED ORDER — METHYLPREDNISOLONE ACETATE 80 MG/ML IJ SUSP
120.0000 mg | Freq: Once | INTRAMUSCULAR | Status: AC
  Administered 2014-04-25: 120 mg via INTRAMUSCULAR

## 2014-04-25 NOTE — Progress Notes (Signed)
Subjective:    Patient ID: Alan Franco, male    DOB: Jun 19, 1940, 74 y.o.   MRN: [id number]  HPI  Here with 10 days onset right hand swelling similar to previous episode of gout, very painful 8/10, ,not better after [facility] and taken several days of augmentin and meloxicam for cellulitis.  Pt denies chest pain, increased sob or doe, wheezing, orthopnea, PND, increased LE swelling, palpitations, dizziness or syncope.  Pt denies new neurological symptoms such as new headache, or facial or extremity weakness or numbness   Pt denies polydipsia, polyuria,  Past Medical History  Diagnosis Date  . ALCOHOL ABUSE 08/24/2009  . CAROTID ARTERY DISEASE 08/30/2009  . CELLULITIS AND ABSCESS OF LEG EXCEPT FOOT 11/27/2009  . COPD 08/24/2009  . CORONARY ARTERY DISEASE 08/24/2009  . 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 GOUT 08/24/2009  . HEPATITIS C 08/24/2009  . HYPERLIPIDEMIA 08/24/2009  . HYPERTENSION 08/24/2009  . KNEE PAIN 02/14/2010  . MRSA 08/24/2009  . PERIPHERAL VASCULAR DISEASE 08/24/2009  . Preventative health care 08/09/2010  . RENAL INSUFFICIENCY 08/24/2009  . SICKLE CELL TRAIT 08/24/2009  . CKD (chronic kidney disease) 02/06/2011  . GERD (gastroesophageal reflux disease) 02/06/2011  . BPH (benign prostatic hyperplasia) 02/06/2011  . Degenerative joint disease 08/10/2011   Past Surgical History  Procedure Laterality Date  . Knee surgury      arthoscopic on the right, Dr. French Ana  . Hand surgery Right     reports that he has quit smoking. He has never used smokeless tobacco. He reports that he does not drink alcohol or use illicit drugs. family history includes Alcohol abuse in his father and mother; Alzheimer's disease in his father; Cancer in his sister. There is no history of Colon cancer. No Known Allergies Current Outpatient Prescriptions on File Prior to Visit  Medication Sig Dispense Refill  . allopurinol (ZYLOPRIM) 100 MG tablet Take 1 tablet (100 mg total) by mouth daily as needed.  Gout 30 tablet 0  . amLODipine (NORVASC) 10 MG tablet Take 1 tablet (10 mg total) by mouth daily. 90 tablet 3  . atorvastatin (LIPITOR) 10 MG tablet Take 1 tablet (10 mg total) by mouth daily. 90 tablet 3  . celecoxib (CELEBREX) 100 MG capsule Take 1 capsule (100 mg total) by mouth 2 (two) times daily as needed for pain. 180 capsule 3  . fluconazole (DIFLUCAN) 100 MG tablet Take 1 tablet (100 mg total) by mouth daily. 7 tablet 0  . fluticasone (FLOVENT HFA) 110 MCG/ACT inhaler Inhale 1 puff into the lungs 2 (two) times daily. 1 Inhaler 12  . Ipratropium-Albuterol (COMBIVENT RESPIMAT) 20-100 MCG/ACT AERS respimat Inhale 1 puff into the lungs every 6 (six) hours as needed for wheezing. 4 g 11  . ketoconazole (NIZORAL) 2 % cream Apply 1 application topically daily. 30 g 1  . lidocaine-hydrocortisone (ANAMANTEL HC) 3-0.5 % CREA Place 1 Applicatorful rectally 2 (two) times daily. As needed only for pain and irritation 85 g 0  . nystatin (MYCOSTATIN/NYSTOP) 100000 UNIT/GM POWD Use as directed to affected area up to three times per day 60 g 2  . omeprazole (PRILOSEC) 20 MG capsule Take 1 capsule (20 mg total) by mouth 2 (two) times daily. 180 capsule 3  . sildenafil (VIAGRA) 100 MG tablet Take 1 tablet (100 mg total) by mouth as needed for erectile dysfunction. 10 tablet 11  . tamsulosin (FLOMAX) 0.4 MG CAPS capsule Take 1 capsule (0.4 mg total) by mouth daily. [place]  capsule 3  . triamcinolone cream (KENALOG) 0.1 % Apply 1 application topically 2 (two) times daily. 30 g 0   No current facility-administered medications on file prior to visit.   Review of Systems  Constitutional: Negative for unusual diaphoresis or night sweats HENT: Negative for ringing in ear or discharge Eyes: Negative for double vision or worsening visual disturbance.  Respiratory: Negative for choking and stridor.   Gastrointestinal: Negative for vomiting or other signifcant bowel change Genitourinary: Negative for hematuria or  change in urine volume.  Musculoskeletal: Negative for other MSK pain or swelling Skin: Negative for color change and worsening wound.  Neurological: Negative for tremors and numbness other than noted  Psychiatric/Behavioral: Negative for decreased concentration or agitation other than above       Objective:   Physical Exam BP 142/82 mmHg  Pulse 77  Temp(Src) 97.8 F (36.6 C) (Oral)  Resp 18  Ht 5' 11.75" (1.822 m)  Wt 176 lb 1.3 oz (79.869 kg)  BMI 24.06 kg/m2  SpO2 96% VS noted, not ill appearing but in pain Constitutional: Pt appears in no significant distress HENT: Head: NCAT.  Right Ear: External ear normal.  Left Ear: External ear normal.  Eyes: . Pupils are equal, round, and reactive to light. Conjunctivae and EOM are normal Neck: Normal range of motion. Neck supple.  Cardiovascular: Normal rate and regular rhythm.   Pulmonary/Chest: Effort normal and breath sounds without rales or wheezing.  Right wrist 3+ swelling/tender with reduced ROM, without signficant skin erythema, induration, red streaks or drainage, with swelling extending to post hand and first 2 fingers Neurological: Pt is alert. Not confused , motor grossly intact Skin: Skin is warm. No rash, no LE edema Psychiatric: Pt behavior is normal. No agitation.     Assessment & Plan:

## 2014-04-25 NOTE — Progress Notes (Signed)
Pre visit review using our clinic review tool, if applicable. No additional management support is needed unless otherwise documented below in the visit note. 

## 2014-04-25 NOTE — Assessment & Plan Note (Signed)
stable overall by history and exam, recent data reviewed with pt, and pt to continue medical treatment as before,  to f/u any worsening symptoms or concerns BP Readings from Last 3 Encounters:  04/25/14 142/82  04/04/14 138/70  03/21/14 140/82

## 2014-04-25 NOTE — Patient Instructions (Addendum)
You had the steroid shot today  Please take all new medication as prescribed - the prednisone, and the pain medication if needed  Please take all new medication as prescribed - the indomethacin for future attacks if needed  Please continue all other medications as before, and refills have been done if requested.  Please have the pharmacy call with any other refills you may need.  Please keep your appointments with your specialists as you may have planned

## 2014-04-25 NOTE — Assessment & Plan Note (Addendum)
Primarily right wrist, with swelling and discomfort to post hand and first 2 fingers as well, for depomedrol IM, predpac asd, ok to stop the augmentin and mexoxicam from the [place] MD, cont allopurinol, and pain control med, as well as indocin prn future attack

## 2014-04-25 NOTE — Assessment & Plan Note (Signed)
None recent to exacerbate the gout per pt, encourage to abstain

## 2014-07-02 ENCOUNTER — Emergency Department (HOSPITAL_COMMUNITY)
Admission: EM | Admit: 2014-07-02 | Discharge: 2014-07-02 | Discharge status: Absent without leave | Payer: Medicare HMO | Source: Home / Self Care

## 2014-08-06 ENCOUNTER — Encounter (HOSPITAL_COMMUNITY): Payer: Self-pay | Admitting: Emergency Medicine

## 2014-08-06 ENCOUNTER — Emergency Department (HOSPITAL_COMMUNITY)
Admission: EM | Admit: 2014-08-06 | Discharge: 2014-08-07 | Disposition: A | Discharge status: Home / Self Care | Payer: Medicare HMO | Attending: Emergency Medicine | Admitting: Emergency Medicine

## 2014-08-06 ENCOUNTER — Emergency Department (HOSPITAL_COMMUNITY): Payer: Medicare HMO

## 2014-08-06 DIAGNOSIS — M109 Gout, unspecified: Secondary | ICD-10-CM | POA: Insufficient documentation

## 2014-08-06 DIAGNOSIS — E785 Hyperlipidemia, unspecified: Secondary | ICD-10-CM | POA: Diagnosis not present

## 2014-08-06 DIAGNOSIS — N528 Other male erectile dysfunction: Secondary | ICD-10-CM | POA: Insufficient documentation

## 2014-08-06 DIAGNOSIS — I129 Hypertensive chronic kidney disease with stage 1 through stage 4 chronic kidney disease, or unspecified chronic kidney disease: Secondary | ICD-10-CM | POA: Insufficient documentation

## 2014-08-06 DIAGNOSIS — R079 Chest pain, unspecified: Secondary | ICD-10-CM | POA: Diagnosis present

## 2014-08-06 DIAGNOSIS — Z862 Personal history of diseases of the blood and blood-forming organs and certain disorders involving the immune mechanism: Secondary | ICD-10-CM | POA: Diagnosis not present

## 2014-08-06 DIAGNOSIS — I2510 Atherosclerotic heart disease of native coronary artery without angina pectoris: Secondary | ICD-10-CM | POA: Diagnosis not present

## 2014-08-06 DIAGNOSIS — Z792 Long term (current) use of antibiotics: Secondary | ICD-10-CM | POA: Diagnosis not present

## 2014-08-06 DIAGNOSIS — N400 Enlarged prostate without lower urinary tract symptoms: Secondary | ICD-10-CM | POA: Insufficient documentation

## 2014-08-06 DIAGNOSIS — K219 Gastro-esophageal reflux disease without esophagitis: Secondary | ICD-10-CM | POA: Diagnosis not present

## 2014-08-06 DIAGNOSIS — Z8619 Personal history of other infectious and parasitic diseases: Secondary | ICD-10-CM | POA: Insufficient documentation

## 2014-08-06 DIAGNOSIS — Z8614 Personal history of Methicillin resistant Staphylococcus aureus infection: Secondary | ICD-10-CM | POA: Insufficient documentation

## 2014-08-06 DIAGNOSIS — Z87891 Personal history of nicotine dependence: Secondary | ICD-10-CM | POA: Diagnosis not present

## 2014-08-06 DIAGNOSIS — J441 Chronic obstructive pulmonary disease with (acute) exacerbation: Secondary | ICD-10-CM | POA: Diagnosis not present

## 2014-08-06 DIAGNOSIS — Z79899 Other long term (current) drug therapy: Secondary | ICD-10-CM | POA: Diagnosis not present

## 2014-08-06 DIAGNOSIS — J40 Bronchitis, not specified as acute or chronic: Secondary | ICD-10-CM

## 2014-08-06 DIAGNOSIS — Z872 Personal history of diseases of the skin and subcutaneous tissue: Secondary | ICD-10-CM | POA: Diagnosis not present

## 2014-08-06 DIAGNOSIS — J209 Acute bronchitis, unspecified: Secondary | ICD-10-CM | POA: Diagnosis not present

## 2014-08-06 DIAGNOSIS — N189 Chronic kidney disease, unspecified: Secondary | ICD-10-CM | POA: Diagnosis not present

## 2014-08-06 LAB — CBC
HCT: 37.6 % — ABNORMAL LOW (ref 39.0–52.0)
Hemoglobin: 12.5 g/dL — ABNORMAL LOW (ref 13.0–17.0)
MCH: 29.9 pg (ref 26.0–34.0)
MCHC: 33.2 g/dL (ref 30.0–36.0)
MCV: 90 fL (ref 78.0–100.0)
PLATELETS: 220 10*3/uL (ref 150–400)
RBC: 4.18 MIL/uL — ABNORMAL LOW (ref 4.22–5.81)
RDW: 12.2 % (ref 11.5–15.5)
WBC: 7.3 10*3/uL (ref 4.0–10.5)

## 2014-08-06 LAB — BASIC METABOLIC PANEL
ANION GAP: 7 (ref 5–15)
BUN: 20 mg/dL (ref 6–20)
CALCIUM: 9.1 mg/dL (ref 8.9–10.3)
CO2: 26 mmol/L (ref 22–32)
CREATININE: 1.97 mg/dL — AB (ref 0.61–1.24)
Chloride: 105 mmol/L (ref 101–111)
GFR calc non Af Amer: 31 mL/min — ABNORMAL LOW (ref >60)
GFR, EST AFRICAN AMERICAN: 37 mL/min — AB (ref >60)
Glucose, Bld: 117 mg/dL — ABNORMAL HIGH (ref 65–99)
POTASSIUM: 4 mmol/L (ref 3.5–5.1)
SODIUM: 138 mmol/L (ref 135–145)

## 2014-08-06 LAB — I-STAT TROPONIN, ED: TROPONIN I, POC: 0.01 ng/mL (ref 0.00–0.08)

## 2014-08-06 NOTE — ED Notes (Signed)
Pt states that 3-4 days ago he started having midsternal chest pain that is exacerbated by breathing and movement. Alert and oriented.

## 2014-08-06 NOTE — ED Provider Notes (Signed)
CSN: [id number]     Arrival date & time 08/06/14  2147 History   First MD Initiated Contact with Patient 08/06/14 2209     Chief Complaint  Patient presents with  . Chest Pain   Patient is a 74 y.o. male presenting with chest pain. The history is provided by the patient.  Chest Pain Pain location:  R chest Pain quality: sharp   Pain radiates to:  Does not radiate Pain severity:  Moderate Onset quality:  Gradual Duration:  4 days Timing:  Constant Chronicity:  New Context: breathing   Relieved by:  Nothing Worsened by:  Deep breathing and coughing Ineffective treatments:  None tried Associated symptoms: cough (brown sputum) and shortness of breath ( he has been using his inhalers more than usual )   Associated symptoms: no fever, no nausea, not vomiting and no weakness   Risk factors: no coronary artery disease, no prior DVT/PE and no smoking    patient states the pain in his chest has been constant for 4 days. He has been using his inhaler more than usual. He has not had any fevers. Denies any leg swelling.  Past Medical History  Diagnosis Date  . ALCOHOL ABUSE 08/24/2009  . CAROTID ARTERY DISEASE 08/30/2009  . CELLULITIS AND ABSCESS OF LEG EXCEPT FOOT 11/27/2009  . COPD 08/24/2009  . CORONARY ARTERY DISEASE 08/24/2009  . 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 GOUT 08/24/2009  . HEPATITIS C 08/24/2009  . HYPERLIPIDEMIA 08/24/2009  . HYPERTENSION 08/24/2009  . KNEE PAIN 02/14/2010  . MRSA 08/24/2009  . PERIPHERAL VASCULAR DISEASE 08/24/2009  . Preventative health care 08/09/2010  . RENAL INSUFFICIENCY 08/24/2009  . SICKLE CELL TRAIT 08/24/2009  . CKD (chronic kidney disease) 02/06/2011  . GERD (gastroesophageal reflux disease) 02/06/2011  . BPH (benign prostatic hyperplasia) 02/06/2011  . Degenerative joint disease 08/10/2011   Past Surgical History  Procedure Laterality Date  . Knee surgury      arthoscopic on the right, Dr. French Ana  . Hand surgery Right    Family History  Problem  Relation Age of Onset  . Alcohol abuse Mother   . Alcohol abuse Father   . Alzheimer's disease Father   . Colon cancer Neg Hx   . Cancer Sister    History  Substance Use Topics  . Smoking status: Former Research scientist (life sciences)  . Smokeless tobacco: Never Used  . Alcohol Use: No    Review of Systems  Constitutional: Negative for fever.  Respiratory: Positive for cough (brown sputum) and shortness of breath ( he has been using his inhalers more than usual ).   Cardiovascular: Positive for chest pain.  Gastrointestinal: Negative for nausea and vomiting.  Neurological: Negative for weakness.  All other systems reviewed and are negative.     Allergies  Review of patient's allergies indicates no known allergies.  Home Medications   Prior to Admission medications   Medication Sig Start Date End Date Taking? Authorizing Provider  amLODipine (NORVASC) 5 MG tablet Take 1 tablet by mouth daily. 07/03/14  Yes Historical Provider, MD  celecoxib (CELEBREX) 100 MG capsule Take 1 capsule (100 mg total) by mouth 2 (two) times daily as needed for pain. Patient taking differently: Take 100 mg by mouth 2 (two) times daily as needed for mild pain.  11/02/12  Yes Biagio Borg, MD  hydrocortisone cream 1 % Apply 1 application topically 2 (two) times daily as needed for itching.   Yes Historical Provider, MD  indomethacin (INDOCIN) 50 MG capsule  Take 1 capsule (50 mg total) by mouth 3 (three) times daily as needed. Patient taking differently: Take 50 mg by mouth 3 (three) times daily as needed for mild pain.  04/25/14  Yes Biagio Borg, MD  Ipratropium-Albuterol (COMBIVENT RESPIMAT) 20-100 MCG/ACT AERS respimat Inhale 1 puff into the lungs every 6 (six) hours as needed for wheezing. 11/02/12  Yes Biagio Borg, MD  naproxen sodium (ANAPROX) 220 MG tablet Take 220 mg by mouth 2 (two) times daily as needed (pain).   Yes Historical Provider, MD  sildenafil (VIAGRA) 100 MG tablet Take 1 tablet (100 mg total) by mouth as  needed for erectile dysfunction. 12/14/13  Yes Biagio Borg, MD  allopurinol (ZYLOPRIM) 100 MG tablet Take 1 tablet (100 mg total) by mouth daily as needed. Gout Patient not taking: Reported on 08/06/2014 02/27/14 02/27/15  Janith Lima, MD  amLODipine (NORVASC) 10 MG tablet Take 1 tablet (10 mg total) by mouth daily. Patient not taking: Reported on 08/06/2014 03/21/14   Biagio Borg, MD  atorvastatin (LIPITOR) 10 MG tablet Take 1 tablet (10 mg total) by mouth daily. Patient not taking: Reported on 08/06/2014 02/06/11 03/02/15  Biagio Borg, MD  azithromycin (ZITHROMAX) 250 MG tablet Take 1 tablet (250 mg total) by mouth daily. Take first 2 tablets together, then 1 every day until finished. 08/07/14   Dorie Rank, MD  fluticasone (FLOVENT HFA) 110 MCG/ACT inhaler Inhale 1 puff into the lungs 2 (two) times daily. Patient not taking: Reported on 08/06/2014 08/06/11 03/02/15  Biagio Borg, MD  HYDROcodone-acetaminophen [facility]) 7.5-325 MG per tablet Take 1 tablet by mouth every 6 (six) hours as needed for moderate pain. Patient not taking: Reported on 08/06/2014 04/25/14   Biagio Borg, MD  ketoconazole (NIZORAL) 2 % cream Apply 1 application topically daily. Patient not taking: Reported on 08/06/2014 12/14/13   Biagio Borg, MD  lidocaine-hydrocortisone [facility]) 3-0.5 % CREA Place 1 Applicatorful rectally 2 (two) times daily. As needed only for pain and irritation Patient not taking: Reported on 08/06/2014 12/14/13   Biagio Borg, MD  nystatin (MYCOSTATIN/NYSTOP) 100000 UNIT/GM POWD Use as directed to affected area up to three times per day Patient not taking: Reported on 08/06/2014 02/15/14   Biagio Borg, MD  omeprazole (PRILOSEC) 20 MG capsule Take 1 capsule (20 mg total) by mouth 2 (two) times daily. Patient not taking: Reported on 08/06/2014 02/06/11 03/02/15  Biagio Borg, MD  predniSONE (DELTASONE) 50 MG tablet Take 1 tablet (50 mg total) by mouth daily. 08/07/14   Dorie Rank, MD  tamsulosin (FLOMAX) 0.4 MG CAPS  capsule Take 1 capsule (0.4 mg total) by mouth daily. Patient not taking: Reported on 08/06/2014 12/14/13   Biagio Borg, MD  traMADol (ULTRAM) 50 MG tablet Take 1 tablet (50 mg total) by mouth every 6 (six) hours as needed. 08/07/14   Dorie Rank, MD  triamcinolone cream (KENALOG) 0.1 % Apply 1 application topically 2 (two) times daily. Patient not taking: Reported on 08/06/2014 03/21/14   Biagio Borg, MD   BP 143/78 mmHg  Pulse 68  Temp(Src) 97.3 F (36.3 C) (Oral)  Resp 12  Ht '6\' 1"'$  (1.854 m)  Wt 187 lb (84.823 kg)  BMI 24.68 kg/m2  SpO2 99% Physical Exam  Constitutional: He appears well-developed and well-nourished. No distress.  HENT:  Head: Normocephalic and atraumatic.  Right Ear: External ear normal.  Left Ear: External ear normal.  Eyes: Conjunctivae are normal.  Right eye exhibits no discharge. Left eye exhibits no discharge. No scleral icterus.  Neck: Neck supple. No tracheal deviation present.  Cardiovascular: Normal rate, regular rhythm and intact distal pulses.   Pulmonary/Chest: Effort normal and breath sounds normal. No stridor. No respiratory distress. He has no wheezes. He has no rales.  Abdominal: Soft. Bowel sounds are normal. He exhibits no distension. There is no tenderness. There is no rebound and no guarding.  Musculoskeletal: He exhibits no edema or tenderness.  Neurological: He is alert. He has normal strength. No cranial nerve deficit (no facial droop, extraocular movements intact, no slurred speech) or sensory deficit. He exhibits normal muscle tone. He displays no seizure activity. Coordination normal.  Skin: Skin is warm and dry. No rash noted.  Psychiatric: He has a normal mood and affect.  Nursing note and vitals reviewed.   ED Course  Procedures (including critical care time) Labs Review Labs Reviewed  BASIC METABOLIC PANEL - Abnormal; Notable for the following:    Glucose, Bld 117 (*)    Creatinine, Ser 1.97 (*)    GFR calc non Af Amer 31 (*)    GFR  calc Af Amer 37 (*)    All other components within normal limits  CBC - Abnormal; Notable for the following:    RBC 4.18 (*)    Hemoglobin 12.5 (*)    HCT 37.6 (*)    All other components within normal limits  I-STAT TROPOININ, ED    Imaging Review Dg Chest 2 View  08/06/2014   CLINICAL DATA:  Chest pain for 3-4 days, worse with breathing and movement, tired. History of alcohol abuse, COPD, hypertension, chronic kidney disease.  EXAM: CHEST  2 VIEW  COMPARISON:  Chest radiograph November 12, 2011  FINDINGS: Cardiac silhouette is unremarkable and unchanged. Mildly calcified aortic knob. No pleural effusion or focal consolidation. Increased lung volumes, which corresponds to patient's known COPD. Stable granuloma LEFT upper lobe. No pneumothorax. Soft tissue planes and included osseous structure nonsuspicious. Moderate thoracic degenerative change and mild levoscoliosis.  IMPRESSION: COPD, no superimposed acute cardiopulmonary process.   Electronically Signed   By: Elon Alas M.D.   On: 08/06/2014 22:46     EKG Interpretation   Date/Time:  Sunday August 06 2014 21:53:21 EDT Ventricular Rate:  86 PR Interval:  155 QRS Duration: 86 QT Interval:  359 QTC Calculation: 429 R Axis:   72 Text Interpretation:  Sinus rhythm Baseline wander in lead(s) V5 Premature  atrial complexes resolved since last tracing Confirmed by Aiza Vollrath  MD-J, Bonnetta Allbee  ([id number]) on 08/06/2014 9:57:21 PM     Medications  predniSONE (DELTASONE) tablet 60 mg (not administered)    MDM   Final diagnoses:  Bronchitis    Pt has been having pleuritic type chest discomfort, recent cough, increased used of inhalers.  No wheezing in the ED.  DOubt PE or ACS.  Will dc home with steroids, pain meds, abx for purulent sputum production.  Follow up with PCP    Dorie Rank, MD 08/07/14 [phone]

## 2014-08-06 NOTE — ED Notes (Signed)
Nurse to obtain labs with IV start

## 2014-08-07 MED ORDER — AZITHROMYCIN 250 MG PO TABS: 250.0000 mg | ORAL_TABLET | Freq: Every day | ORAL | Status: DC

## 2014-08-07 MED ORDER — TRAMADOL HCL 50 MG PO TABS: 50.0000 mg | ORAL_TABLET | Freq: Four times a day (QID) | ORAL | Status: DC | PRN

## 2014-08-07 MED ORDER — PREDNISONE 20 MG PO TABS
60.0000 mg | ORAL_TABLET | Freq: Once | ORAL | Status: AC
  Administered 2014-08-07: 60 mg via ORAL
  Filled 2014-08-07: qty 3

## 2014-08-07 MED ORDER — PREDNISONE 50 MG PO TABS: 50.0000 mg | ORAL_TABLET | Freq: Every day | ORAL | Status: DC

## 2014-08-07 NOTE — Discharge Instructions (Signed)

## 2014-08-17 ENCOUNTER — Ambulatory Visit (INDEPENDENT_AMBULATORY_CARE_PROVIDER_SITE_OTHER): Payer: Medicare HMO | Admitting: Internal Medicine

## 2014-08-17 ENCOUNTER — Encounter: Payer: Self-pay | Admitting: Internal Medicine

## 2014-08-17 VITALS — BP 126/70 | HR 77 | Temp 97.9°F | Ht 73.0 in | Wt 176.0 lb

## 2014-08-17 DIAGNOSIS — I10 Essential (primary) hypertension: Secondary | ICD-10-CM

## 2014-08-17 DIAGNOSIS — R05 Cough: Secondary | ICD-10-CM

## 2014-08-17 DIAGNOSIS — N183 Chronic kidney disease, stage 3 (moderate): Secondary | ICD-10-CM | POA: Diagnosis not present

## 2014-08-17 DIAGNOSIS — R059 Cough, unspecified: Secondary | ICD-10-CM | POA: Insufficient documentation

## 2014-08-17 MED ORDER — FLUTICASONE PROPIONATE HFA 110 MCG/ACT IN AERO: 1.0000 | INHALATION_SPRAY | Freq: Two times a day (BID) | RESPIRATORY_TRACT | Status: AC

## 2014-08-17 MED ORDER — CELECOXIB 100 MG PO CAPS: 100.0000 mg | ORAL_CAPSULE | Freq: Two times a day (BID) | ORAL | Status: DC | PRN

## 2014-08-17 MED ORDER — TRIAMCINOLONE ACETONIDE 0.1 % EX CREA: 1.0000 "application " | TOPICAL_CREAM | Freq: Two times a day (BID) | CUTANEOUS | Status: DC

## 2014-08-17 NOTE — Patient Instructions (Addendum)
Please continue all other medications as before, and refills have been done if requested  - for the celebrex, cream, and inhaler  Please have the pharmacy call with any other refills you may need.  Please keep your appointments with your specialists as you may have planned  You are given the work note as well

## 2014-08-17 NOTE — Progress Notes (Signed)
Subjective:    Patient ID: MAXIME BECKNER, male    DOB: [birth year], 74 y.o.   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 Today , pain is with left upper chest and shoulder, worse to move arm but has left shoulder FROM.  Needs letter for work and change back to celebrex Past Medical History  Diagnosis Date  . ALCOHOL ABUSE 08/24/2009  . CAROTID ARTERY DISEASE 08/30/2009  . CELLULITIS AND ABSCESS OF LEG EXCEPT FOOT 11/27/2009  . COPD 08/24/2009  . CORONARY ARTERY DISEASE 08/24/2009  . 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 GOUT 08/24/2009  . HEPATITIS C 08/24/2009  . HYPERLIPIDEMIA 08/24/2009  . HYPERTENSION 08/24/2009  . KNEE PAIN 02/14/2010  . MRSA 08/24/2009  . PERIPHERAL VASCULAR DISEASE 08/24/2009  . Preventative health care 08/09/2010  . RENAL INSUFFICIENCY 08/24/2009  . SICKLE CELL TRAIT 08/24/2009  . CKD (chronic kidney disease) 02/06/2011  . GERD (gastroesophageal reflux disease) 02/06/2011  . BPH (benign prostatic hyperplasia) 02/06/2011  . Degenerative joint disease 08/10/2011   Past Surgical History  Procedure Laterality Date  . Knee surgury      arthoscopic on the right, Dr. French Ana  . Hand surgery Right     reports that he has quit smoking. He has never used smokeless tobacco. He reports that he does not drink alcohol or use illicit drugs. family history includes Alcohol abuse in his father and mother; Alzheimer's disease in his father; Cancer in his sister. There is no history of Colon cancer. No Known Allergies Current Outpatient Prescriptions on File Prior to Visit  Medication Sig  Dispense Refill  . allopurinol (ZYLOPRIM) 100 MG tablet Take 1 tablet (100 mg total) by mouth daily as needed. Gout 30 tablet 0  . amLODipine (NORVASC) 5 MG tablet Take 1 tablet by mouth daily.    . celecoxib (CELEBREX) 100 MG capsule Take 1 capsule (100 mg total) by mouth 2 (two) times daily as needed for pain. (Patient taking differently: Take 100 mg by mouth 2 (two) times daily as needed for mild pain. ) 180 capsule 3  . indomethacin (INDOCIN) 50 MG capsule Take 1 capsule (50 mg total) by mouth 3 (three) times daily as needed. (Patient taking differently: Take 50 mg by mouth 3 (three) times daily as needed for mild pain. ) 90 capsule 3  . Ipratropium-Albuterol (COMBIVENT RESPIMAT) 20-100 MCG/ACT AERS respimat Inhale 1 puff into the lungs every 6 (six) hours as needed for wheezing. 4 g 11  . ketoconazole (NIZORAL) 2 % cream Apply 1 application topically daily. 30 g 1  . naproxen sodium (ANAPROX) 220 MG tablet Take 220 mg by mouth 2 (two) times daily as needed (pain).    Marland Kitchen omeprazole (PRILOSEC) 20 MG capsule Take 1 capsule (20 mg total) by mouth 2 (two) times daily. 180 capsule 3  . sildenafil (VIAGRA) 100 MG tablet Take 1 tablet (100 mg total) by mouth as needed for erectile dysfunction. 10 tablet 11  . tamsulosin (FLOMAX) 0.4 MG CAPS capsule Take 1 capsule (0.4 mg total) by mouth daily. 90 capsule 3  .  amLODipine (NORVASC) 10 MG tablet Take 1 tablet (10 mg total) by mouth daily. (Patient not taking: Reported on 08/06/2014) 90 tablet 3  . atorvastatin (LIPITOR) 10 MG tablet Take 1 tablet (10 mg total) by mouth daily. (Patient not taking: Reported on 08/06/2014) 90 tablet 3  . azithromycin (ZITHROMAX) 250 MG tablet Take 1 tablet (250 mg total) by mouth daily. Take first 2 tablets together, then 1 every day until finished. (Patient not taking: Reported on 08/17/2014) 6 tablet 0  . fluticasone (FLOVENT HFA) 110 MCG/ACT inhaler Inhale 1 puff into the lungs 2 (two) times daily. (Patient not taking: Reported  on 08/06/2014) 1 Inhaler 12  . HYDROcodone-acetaminophen (NORCO) 7.5-325 MG per tablet Take 1 tablet by mouth every 6 (six) hours as needed for moderate pain. (Patient not taking: Reported on 08/06/2014) 20 tablet 0  . hydrocortisone cream 1 % Apply 1 application topically 2 (two) times daily as needed for itching.    . lidocaine-hydrocortisone (ANAMANTEL HC) 3-0.5 % CREA Place 1 Applicatorful rectally 2 (two) times daily. As needed only for pain and irritation (Patient not taking: Reported on 08/17/2014) 85 g 0  . nystatin (MYCOSTATIN/NYSTOP) 100000 UNIT/GM POWD Use as directed to affected area up to three times per day (Patient not taking: Reported on 08/06/2014) 60 g 2  . predniSONE (DELTASONE) 50 MG tablet Take 1 tablet (50 mg total) by mouth daily. (Patient not taking: Reported on 08/17/2014) 4 tablet 0  . traMADol (ULTRAM) 50 MG tablet Take 1 tablet (50 mg total) by mouth every 6 (six) hours as needed. (Patient not taking: Reported on 08/17/2014) 15 tablet 0  . triamcinolone cream (KENALOG) 0.1 % Apply 1 application topically 2 (two) times daily. (Patient not taking: Reported on 08/06/2014) 30 g 0   No current facility-administered medications on file prior to visit.   Review of Systems  Constitutional: Negative for unusual diaphoresis or night sweats HENT: Negative for ringing in ear or discharge Eyes: Negative for double vision or worsening visual disturbance.  Respiratory: Negative for choking and stridor.   Gastrointestinal: Negative for vomiting or other signifcant bowel change Genitourinary: Negative for hematuria or change in urine volume.  Musculoskeletal: Negative for other MSK pain or swelling Skin: Negative for color change and worsening wound.  Neurological: Negative for tremors and numbness other than noted  Psychiatric/Behavioral: Negative for decreased concentration or agitation other than above       Objective:   Physical Exam BP 126/70 mmHg  Pulse 77  Temp(Src) 97.9 F  (36.6 C) (Oral)  Ht '6\' 1"'$  (1.854 m)  Wt 176 lb (79.833 kg)  BMI 23.23 kg/m2  SpO2 98% VS noted,  Constitutional: Pt appears in no significant distress HENT: Head: NCAT.  Right Ear: External ear normal.  Left Ear: External ear normal.  Eyes: . Pupils are equal, round, and reactive to light. Conjunctivae and EOM are normal Neck: Normal range of motion. Neck supple.  Cardiovascular: Normal rate and regular rhythm.   Pulmonary/Chest: Effort normal and breath sounds without rales or wheezing.  Abd:  Soft, NT, ND, + BS Neurological: Pt is alert. Not confused , motor grossly intact Skin: Skin is warm. No rash, no LE edema Psychiatric: Pt behavior is normal. No agitation.  Left shoulder NT, FROM    Assessment & Plan:

## 2014-08-17 NOTE — Assessment & Plan Note (Signed)
Resolved, recent cxr neg,  to f/u any worsening symptoms or concerns

## 2014-08-17 NOTE — Assessment & Plan Note (Signed)
stable overall by history and exam, recent data reviewed with pt, and pt to continue medical treatment as before,  to f/u any worsening symptoms or concerns BP Readings from Last 3 Encounters:  08/17/14 126/70  08/07/14 132/71  04/25/14 142/82

## 2014-08-17 NOTE — Assessment & Plan Note (Signed)
stable overall by history and exam, recent data reviewed with pt, and pt to continue medical treatment as before,  to f/u any worsening symptoms or concerns Lab Results  Component Value Date   WBC 7.3 08/06/2014   HGB 12.5* 08/06/2014   HCT 37.6* 08/06/2014   PLT 220 08/06/2014   GLUCOSE 117* 08/06/2014   CHOL 161 03/22/2014   TRIG 81.0 03/22/2014   HDL 41.70 03/22/2014   LDLCALC 103* 03/22/2014   ALT 69* 03/22/2014   AST 52* 03/22/2014   NA 138 08/06/2014   K 4.0 08/06/2014   CL 105 08/06/2014   CREATININE 1.97* 08/06/2014   BUN 20 08/06/2014   CO2 26 08/06/2014   TSH 1.89 03/22/2014   PSA 1.04 03/22/2014   INR 1.0 03/02/2008

## 2014-08-17 NOTE — Progress Notes (Signed)
Pre visit review using our clinic review tool, if applicable. No additional management support is needed unless otherwise documented below in the visit note. 

## 2014-12-08 ENCOUNTER — Observation Stay (HOSPITAL_COMMUNITY)
Admission: EM | Admit: 2014-12-08 | Discharge: 2014-12-09 | Disposition: A | Discharge status: Home / Self Care | Payer: Medicare HMO | Attending: Internal Medicine | Admitting: Internal Medicine

## 2014-12-08 ENCOUNTER — Emergency Department (HOSPITAL_COMMUNITY): Payer: Medicare HMO

## 2014-12-08 ENCOUNTER — Encounter (HOSPITAL_COMMUNITY): Payer: Self-pay | Admitting: Emergency Medicine

## 2014-12-08 DIAGNOSIS — C7931 Secondary malignant neoplasm of brain: Secondary | ICD-10-CM | POA: Diagnosis not present

## 2014-12-08 DIAGNOSIS — D72829 Elevated white blood cell count, unspecified: Secondary | ICD-10-CM | POA: Diagnosis not present

## 2014-12-08 DIAGNOSIS — N189 Chronic kidney disease, unspecified: Secondary | ICD-10-CM

## 2014-12-08 DIAGNOSIS — Z79899 Other long term (current) drug therapy: Secondary | ICD-10-CM | POA: Insufficient documentation

## 2014-12-08 DIAGNOSIS — N183 Chronic kidney disease, stage 3 (moderate): Secondary | ICD-10-CM | POA: Insufficient documentation

## 2014-12-08 DIAGNOSIS — N179 Acute kidney failure, unspecified: Secondary | ICD-10-CM | POA: Diagnosis not present

## 2014-12-08 DIAGNOSIS — J189 Pneumonia, unspecified organism: Secondary | ICD-10-CM

## 2014-12-08 DIAGNOSIS — C3491 Malignant neoplasm of unspecified part of right bronchus or lung: Secondary | ICD-10-CM

## 2014-12-08 DIAGNOSIS — B1920 Unspecified viral hepatitis C without hepatic coma: Secondary | ICD-10-CM | POA: Insufficient documentation

## 2014-12-08 DIAGNOSIS — I739 Peripheral vascular disease, unspecified: Secondary | ICD-10-CM | POA: Insufficient documentation

## 2014-12-08 DIAGNOSIS — N400 Enlarged prostate without lower urinary tract symptoms: Secondary | ICD-10-CM | POA: Insufficient documentation

## 2014-12-08 DIAGNOSIS — C3490 Malignant neoplasm of unspecified part of unspecified bronchus or lung: Secondary | ICD-10-CM | POA: Diagnosis not present

## 2014-12-08 DIAGNOSIS — I2510 Atherosclerotic heart disease of native coronary artery without angina pectoris: Secondary | ICD-10-CM | POA: Insufficient documentation

## 2014-12-08 DIAGNOSIS — R042 Hemoptysis: Principal | ICD-10-CM | POA: Diagnosis present

## 2014-12-08 DIAGNOSIS — M109 Gout, unspecified: Secondary | ICD-10-CM | POA: Insufficient documentation

## 2014-12-08 DIAGNOSIS — Z87891 Personal history of nicotine dependence: Secondary | ICD-10-CM | POA: Insufficient documentation

## 2014-12-08 DIAGNOSIS — J449 Chronic obstructive pulmonary disease, unspecified: Secondary | ICD-10-CM | POA: Insufficient documentation

## 2014-12-08 DIAGNOSIS — E785 Hyperlipidemia, unspecified: Secondary | ICD-10-CM | POA: Insufficient documentation

## 2014-12-08 DIAGNOSIS — I129 Hypertensive chronic kidney disease with stage 1 through stage 4 chronic kidney disease, or unspecified chronic kidney disease: Secondary | ICD-10-CM | POA: Insufficient documentation

## 2014-12-08 DIAGNOSIS — K219 Gastro-esophageal reflux disease without esophagitis: Secondary | ICD-10-CM | POA: Insufficient documentation

## 2014-12-08 LAB — COMPREHENSIVE METABOLIC PANEL
ALBUMIN: 3.2 g/dL — AB (ref 3.5–5.0)
ALT: 23 U/L (ref 17–63)
ANION GAP: 9 (ref 5–15)
AST: 45 U/L — ABNORMAL HIGH (ref 15–41)
Alkaline Phosphatase: 96 U/L (ref 38–126)
BILIRUBIN TOTAL: 0.4 mg/dL (ref 0.3–1.2)
BUN: 45 mg/dL — ABNORMAL HIGH (ref 6–20)
CALCIUM: 9.8 mg/dL (ref 8.9–10.3)
CO2: 25 mmol/L (ref 22–32)
Chloride: 102 mmol/L (ref 101–111)
Creatinine, Ser: 2.52 mg/dL — ABNORMAL HIGH (ref 0.61–1.24)
GFR calc Af Amer: 27 mL/min — ABNORMAL LOW (ref >60)
GFR calc non Af Amer: 23 mL/min — ABNORMAL LOW (ref >60)
Glucose, Bld: 105 mg/dL — ABNORMAL HIGH (ref 65–99)
Potassium: 4.5 mmol/L (ref 3.5–5.1)
Sodium: 136 mmol/L (ref 135–145)
Total Protein: 9.1 g/dL — ABNORMAL HIGH (ref 6.5–8.1)

## 2014-12-08 LAB — ABO/RH: ABO/RH(D): O POS

## 2014-12-08 LAB — URINALYSIS, ROUTINE W REFLEX MICROSCOPIC
Bilirubin Urine: NEGATIVE
GLUCOSE, UA: NEGATIVE mg/dL
Hgb urine dipstick: NEGATIVE
KETONES UR: NEGATIVE mg/dL
LEUKOCYTES UA: NEGATIVE
NITRITE: NEGATIVE
PH: 6.5 (ref 5.0–8.0)
Protein, ur: NEGATIVE mg/dL
Specific Gravity, Urine: 1.015 (ref 1.005–1.030)

## 2014-12-08 LAB — CBC WITH DIFFERENTIAL/PLATELET
BASOS PCT: 0 %
Basophils Absolute: 0 10*3/uL (ref 0.0–0.1)
Eosinophils Absolute: 0.2 10*3/uL (ref 0.0–0.7)
Eosinophils Relative: 2 %
HEMATOCRIT: 34.7 % — AB (ref 39.0–52.0)
HEMOGLOBIN: 11.6 g/dL — AB (ref 13.0–17.0)
LYMPHS ABS: 2.6 10*3/uL (ref 0.7–4.0)
LYMPHS PCT: 18 %
MCH: 28.4 pg (ref 26.0–34.0)
MCHC: 33.4 g/dL (ref 30.0–36.0)
MCV: 84.8 fL (ref 78.0–100.0)
MONOS PCT: 12 %
Monocytes Absolute: 1.6 10*3/uL — ABNORMAL HIGH (ref 0.1–1.0)
NEUTROS ABS: 9.5 10*3/uL — AB (ref 1.7–7.7)
NEUTROS PCT: 68 %
Platelets: 298 10*3/uL (ref 150–400)
RBC: 4.09 MIL/uL — ABNORMAL LOW (ref 4.22–5.81)
RDW: 12.2 % (ref 11.5–15.5)
WBC: 13.9 10*3/uL — ABNORMAL HIGH (ref 4.0–10.5)

## 2014-12-08 LAB — MRSA PCR SCREENING: MRSA by PCR: NEGATIVE

## 2014-12-08 LAB — TYPE AND SCREEN
ABO/RH(D): O POS
Antibody Screen: NEGATIVE

## 2014-12-08 LAB — I-STAT CG4 LACTIC ACID, ED: LACTIC ACID, VENOUS: 1.76 mmol/L (ref 0.5–2.0)

## 2014-12-08 LAB — I-STAT TROPONIN, ED: Troponin i, poc: 0 ng/mL (ref 0.00–0.08)

## 2014-12-08 MED ORDER — ENSURE ENLIVE PO LIQD
237.0000 mL | Freq: Two times a day (BID) | ORAL | Status: DC
  Administered 2014-12-09: 237 mL via ORAL

## 2014-12-08 MED ORDER — IPRATROPIUM-ALBUTEROL 0.5-2.5 (3) MG/3ML IN SOLN
3.0000 mL | Freq: Three times a day (TID) | RESPIRATORY_TRACT | Status: DC
  Administered 2014-12-08: 3 mL via RESPIRATORY_TRACT
  Filled 2014-12-08: qty 3

## 2014-12-08 MED ORDER — VORICONAZOLE 200 MG PO TABS
200.0000 mg | ORAL_TABLET | Freq: Two times a day (BID) | ORAL | Status: DC
  Administered 2014-12-08 – 2014-12-09 (×2): 200 mg via ORAL
  Filled 2014-12-08 (×2): qty 1

## 2014-12-08 MED ORDER — LEVOFLOXACIN IN D5W 750 MG/150ML IV SOLN: 750.0000 mg | INTRAVENOUS | Status: DC

## 2014-12-08 MED ORDER — LEVOFLOXACIN IN D5W 750 MG/150ML IV SOLN
750.0000 mg | Freq: Once | INTRAVENOUS | Status: AC
  Administered 2014-12-08: 750 mg via INTRAVENOUS
  Filled 2014-12-08: qty 150

## 2014-12-08 MED ORDER — IPRATROPIUM-ALBUTEROL 0.5-2.5 (3) MG/3ML IN SOLN
3.0000 mL | Freq: Once | RESPIRATORY_TRACT | Status: AC
  Administered 2014-12-08: 3 mL via RESPIRATORY_TRACT
  Filled 2014-12-08: qty 3

## 2014-12-08 MED ORDER — ALLOPURINOL 100 MG PO TABS
100.0000 mg | ORAL_TABLET | Freq: Every day | ORAL | Status: DC | PRN
  Filled 2014-12-08: qty 1

## 2014-12-08 MED ORDER — TRAMADOL HCL 50 MG PO TABS
50.0000 mg | ORAL_TABLET | Freq: Four times a day (QID) | ORAL | Status: DC | PRN
  Administered 2014-12-08: 50 mg via ORAL
  Filled 2014-12-08: qty 1

## 2014-12-08 MED ORDER — SODIUM CHLORIDE 0.9 % IV SOLN
INTRAVENOUS | Status: AC
  Administered 2014-12-08: 20:00:00 via INTRAVENOUS

## 2014-12-08 MED ORDER — SODIUM CHLORIDE 0.9 % IV BOLUS (SEPSIS)
1000.0000 mL | Freq: Once | INTRAVENOUS | Status: AC
  Administered 2014-12-08: 1000 mL via INTRAVENOUS

## 2014-12-08 MED ORDER — VANCOMYCIN HCL IN DEXTROSE 750-5 MG/150ML-% IV SOLN
750.0000 mg | INTRAVENOUS | Status: DC
  Administered 2014-12-08: 750 mg via INTRAVENOUS
  Filled 2014-12-08: qty 150

## 2014-12-08 NOTE — ED Notes (Signed)
Pt reports poor IV access; visual exam done; unable to assess IV site; IV team consulted; ultrasound nurse at bedside

## 2014-12-08 NOTE — ED Notes (Signed)
Pt BIB EMS; per EMS pt was in the shower and felt that he needed to cough something up; pt coughed up a golf-sized ball of blood; [facility]; pt was recently diagnosed with stage 4 cancer that has metastasized from the lungs; pt c/o chest pain that began around the time cancer was discovered; pt c/o recurrent SOB and has hx of COPD. NAD noted.

## 2014-12-08 NOTE — ED Notes (Signed)
Hospitalist at bedside 

## 2014-12-08 NOTE — H&P (Signed)
History and Physical  Alan Franco TDD:[id number] DOB: 1940/12/03 DOA: 12/08/2014  Referring physician: EDP PCP: Cathlean Cower, MD   Chief Complaint: hemoptysis  HPI: Alan Franco is a 74 y.o. 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  He reported that he had a biopsy to his chest wall ,was told to have fungal infection, he just saw infectious disease two days ago at the [place], he was started on voriconazole. He reported has been feeling sob with activity for few years, does use inhaler.  cxr obtained from the ED is consistent with metastatic lung ca, lab showed wbc 13.9, hgb 11.6, bnu 45, cr 2.52. Lactic acid wnl.  Review of Systems:  Detail per HPI, Review of systems are otherwise negative  Past Medical History  Diagnosis Date  . ALCOHOL ABUSE 08/24/2009  . CAROTID ARTERY DISEASE 08/30/2009  . CELLULITIS AND ABSCESS OF LEG EXCEPT FOOT 11/27/2009  . COPD 08/24/2009  . CORONARY ARTERY DISEASE 08/24/2009  . 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 GOUT 08/24/2009  . HEPATITIS C 08/24/2009  . HYPERLIPIDEMIA 08/24/2009  . HYPERTENSION 08/24/2009  . KNEE PAIN 02/14/2010  . MRSA 08/24/2009  . PERIPHERAL VASCULAR DISEASE 08/24/2009  . Preventative health care 08/09/2010  . RENAL INSUFFICIENCY 08/24/2009  . SICKLE CELL TRAIT 08/24/2009  . CKD (chronic kidney disease) 02/06/2011  . GERD (gastroesophageal reflux disease) 02/06/2011  . BPH (benign prostatic hyperplasia) 02/06/2011  . Degenerative joint disease 08/10/2011   Past Surgical History  Procedure Laterality Date  . Knee surgury      arthoscopic on the right, Dr. French Ana  . Hand surgery Right    Social History:  reports that he has quit smoking. He has never used smokeless tobacco. He reports that he does not drink alcohol or  use illicit drugs. Patient lives at home & is able to participate in activities of daily living independently   No Known Allergies  Family History  Problem Relation Age of Onset  . Alcohol abuse Mother   . Alcohol abuse Father   . Alzheimer's disease Father   . Colon cancer Neg Hx   . Cancer Sister       Prior to Admission medications   Medication Sig Start Date End Date Taking? Authorizing Provider  allopurinol (ZYLOPRIM) 100 MG tablet Take 1 tablet (100 mg total) by mouth daily as needed. Gout 02/27/14 02/27/15 Yes Janith Lima, MD  Ipratropium-Albuterol (COMBIVENT RESPIMAT) 20-100 MCG/ACT AERS respimat Inhale 1 puff into the lungs every 6 (six) hours as needed for wheezing. 11/02/12  Yes Biagio Borg, MD  voriconazole (VFEND) 200 MG tablet Take 200 mg by mouth 2 (two) times daily.   Yes Historical Provider, MD  amLODipine (NORVASC) 10 MG tablet Take 1 tablet (10 mg total) by mouth daily. Patient not taking: Reported on 08/06/2014 03/21/14   Biagio Borg, MD  atorvastatin (LIPITOR) 10 MG tablet Take 1 tablet (10 mg total) by mouth daily. Patient not taking: Reported on 08/06/2014 02/06/11 03/02/15  Biagio Borg, MD  celecoxib (CELEBREX) 100 MG capsule Take 1 capsule (100 mg total) by mouth 2 (two) times daily as needed. Patient not taking: Reported on 12/08/2014 08/17/14   Biagio Borg, MD  fluticasone [facility]) 110 MCG/ACT inhaler Inhale  1 puff into the lungs 2 (two) times daily. Patient not taking: Reported on 12/08/2014 08/17/14 03/13/18  Biagio Borg, MD  indomethacin (INDOCIN) 50 MG capsule Take 1 capsule (50 mg total) by mouth 3 (three) times daily as needed. Patient taking differently: Take 50 mg by mouth 3 (three) times daily as needed for mild pain.  04/25/14   Biagio Borg, MD  nystatin (MYCOSTATIN/NYSTOP) 100000 UNIT/GM POWD Use as directed to affected area up to three times per day Patient not taking: Reported on 08/06/2014 02/15/14   Biagio Borg, MD  omeprazole (PRILOSEC) 20 MG  capsule Take 1 capsule (20 mg total) by mouth 2 (two) times daily. Patient not taking: Reported on 12/08/2014 02/06/11 03/02/15  Biagio Borg, MD  sildenafil (VIAGRA) 100 MG tablet Take 1 tablet (100 mg total) by mouth as needed for erectile dysfunction. Patient not taking: Reported on 12/08/2014 12/14/13   Biagio Borg, MD  tamsulosin [facility]) 0.4 MG CAPS capsule Take 1 capsule (0.4 mg total) by mouth daily. Patient not taking: Reported on 12/08/2014 12/14/13   Biagio Borg, MD    Physical Exam: BP 144/82 mmHg  Pulse 98  Temp(Src) 97.9 F (36.6 C) (Oral)  Resp 18  Ht '6\' 1"'$  (1.854 m)  Wt 158 lb 1.1 oz (71.7 kg)  BMI 20.86 kg/m2  SpO2 97%  General:  Aaox3, NAD Eyes: PERRL ENT: unremarkable Neck: supple, no JVD Cardiovascular: RRR Respiratory: CTABL, palpable chest wall mass, nontender Abdomen: soft/ND/ND, positive bowel sounds Skin: no rash Musculoskeletal:  No edema Psychiatric: calm/cooperative Neurologic: no focal findings            Labs on Admission:  Basic Metabolic Panel:  Recent Labs Lab 12/08/14 1630  NA 136  K 4.5  CL 102  CO2 25  GLUCOSE 105*  BUN 45*  CREATININE 2.52*  CALCIUM 9.8   Liver Function Tests:  Recent Labs Lab 12/08/14 1630  AST 45*  ALT 23  ALKPHOS 96  BILITOT 0.4  PROT 9.1*  ALBUMIN 3.2*   No results for input(s): LIPASE, AMYLASE in the last 168 hours. No results for input(s): AMMONIA in the last 168 hours. CBC:  Recent Labs Lab 12/08/14 1630  WBC 13.9*  NEUTROABS 9.5*  HGB 11.6*  HCT 34.7*  MCV 84.8  PLT 298   Cardiac Enzymes: No results for input(s): CKTOTAL, CKMB, CKMBINDEX, TROPONINI in the last 168 hours.  BNP (last 3 results) No results for input(s): BNP in the last 8760 hours.  ProBNP (last 3 results) No results for input(s): PROBNP in the last 8760 hours.  CBG: No results for input(s): GLUCAP in the last 168 hours.  Radiological Exams on Admission: Dg Chest 2 View  12/08/2014  CLINICAL DATA:  Was in  shower and coughed, hemoptysis of a golf ball-sized clot of blood, was begun on antifungal medications at [facility] 2 days ago, recently diagnosed with stage IV cancer metastatic to lungs, chest pain, recurrent shortness of breath, COPD, coronary artery disease, former smoker EXAM: CHEST  2 VIEW COMPARISON:  08/06/2014 FINDINGS: Normal heart size and pulmonary vascularity. Interval enlargement of RIGHT hilum question adenopathy. RIGHT suprahilar density in perihilar RIGHT upper lobe may either represent infiltrate pulmonary mass, or a soft tissue mass at the anterior RIGHT chest wall ; anterior aspect of the RIGHT third rib is not well visualized. Remaining lungs mildly hyperinflated but clear. Questionable nodular density versus nipple shadow lower LEFT chest. Numerous EKG leads project over chest. Remaining lungs  clear. No pleural effusion or pneumothorax. Bone destruction of the lateral LEFT second rib with large extrapleural soft tissue density most likely representing metastatic disease. IMPRESSION: Suspected RIGHT hilar adenopathy/perihilar mass. RIGHT upper lobe opacity could represent pulmonary or chest wall metastatic lesion, less likely infiltrate. Additional destructive osseous metastatic lesion lateral LEFT second rib. Correlation with results of prior outside imaging recommended. Electronically Signed   By: Lavonia Dana M.D.   On: 12/08/2014 16:55    EKG: Independently reviewed. NSR, no acute ST/T changes  Assessment/Plan Present on Admission:  . Hemoptysis   Hemoptysis: likely  from stage iv lung ca, will admit under observation status, monitor vital and hgb. Will collect sputum culture and mrsa screening, will cover with vanc/levaquin due to leukocytosis, though patient appear nontoxic.  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  Leukocytosis: no toxic, from recent steroid use?  Copd stable, no wheezing, nebs and home  meds.  Stage iv lung ca, unclear detail, [facility], patient report he is going to get first chemo on 11/23.  H/o hepc. Reported no prior treatment, defer management to [place].   DVT prophylaxis: scd's  Consultants: none  Code Status: DNR  Family Communication:  Patient and wife  Disposition Plan: admit to med tele , obs  Time spent: 79mns  Filmore Molyneux MD, PhD [facility] Pager [phone] 7PM-7AM, please contact night-coverage at [email or web address], password [email or web address]

## 2014-12-08 NOTE — ED Provider Notes (Signed)
CSN: [id number]     Arrival date & time 12/08/14  1555 History   First MD Initiated Contact with Patient 12/08/14 1620     Chief Complaint  Patient presents with  . Hematemesis     (Consider location/radiation/quality/duration/timing/severity/associated sxs/prior Treatment) The history is provided by the patient.  AHMIR BRACKEN is a 74 y.o. male hx of CAD, alcohol abuse, stage IV lung cancer here presenting with hemoptysis. Patient was diagnosed with stage IV lung cancer about a month ago. He finished several courses of brain radiation (most recently was November 11th). He did not receive any chemotherapy or any radiation to his chest. Today he had an episode of coughing. He coughed up some clear phlegm with about 1 teaspoon of blood. Denies any vomiting or any black stools. Of note, patient had a biopsy done at [facility] several months ago and was found to have some sort of mold in his chest but is not currently on any antifungal treatments.    Past Medical History  Diagnosis Date  . ALCOHOL ABUSE 08/24/2009  . CAROTID ARTERY DISEASE 08/30/2009  . CELLULITIS AND ABSCESS OF LEG EXCEPT FOOT 11/27/2009  . COPD 08/24/2009  . CORONARY ARTERY DISEASE 08/24/2009  . 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 GOUT 08/24/2009  . HEPATITIS C 08/24/2009  . HYPERLIPIDEMIA 08/24/2009  . HYPERTENSION 08/24/2009  . KNEE PAIN 02/14/2010  . MRSA 08/24/2009  . PERIPHERAL VASCULAR DISEASE 08/24/2009  . Preventative health care 08/09/2010  . RENAL INSUFFICIENCY 08/24/2009  . SICKLE CELL TRAIT 08/24/2009  . CKD (chronic kidney disease) 02/06/2011  . GERD (gastroesophageal reflux disease) 02/06/2011  . BPH (benign prostatic hyperplasia) 02/06/2011  . Degenerative joint disease 08/10/2011   Past Surgical History  Procedure Laterality Date  . Knee surgury      arthoscopic on the right, Dr. French Ana  . Hand surgery Right    Family History  Problem Relation Age of Onset  . Alcohol abuse Mother   . Alcohol abuse Father   .  Alzheimer's disease Father   . Colon cancer Neg Hx   . Cancer Sister    Social History  Substance Use Topics  . Smoking status: Former Research scientist (life sciences)  . Smokeless tobacco: Never Used  . Alcohol Use: No    Review of Systems  Respiratory: Positive for cough.   All other systems reviewed and are negative.     Allergies  Review of patient's allergies indicates no known allergies.  Home Medications   Prior to Admission medications   Medication Sig Start Date End Date Taking? Authorizing Provider  allopurinol (ZYLOPRIM) 100 MG tablet Take 1 tablet (100 mg total) by mouth daily as needed. Gout 02/27/14 02/27/15 Yes Janith Lima, MD  Ipratropium-Albuterol (COMBIVENT RESPIMAT) 20-100 MCG/ACT AERS respimat Inhale 1 puff into the lungs every 6 (six) hours as needed for wheezing. 11/02/12  Yes Biagio Borg, MD  voriconazole (VFEND) 200 MG tablet Take 200 mg by mouth 2 (two) times daily.   Yes Historical Provider, MD  amLODipine (NORVASC) 10 MG tablet Take 1 tablet (10 mg total) by mouth daily. Patient not taking: Reported on 08/06/2014 03/21/14   Biagio Borg, MD  atorvastatin (LIPITOR) 10 MG tablet Take 1 tablet (10 mg total) by mouth daily. Patient not taking: Reported on 08/06/2014 02/06/11 03/02/15  Biagio Borg, MD  celecoxib (CELEBREX) 100 MG capsule Take 1 capsule (100 mg total) by mouth 2 (two) times daily as needed. Patient not taking: Reported on 12/08/2014 08/17/14  Biagio Borg, MD  fluticasone (FLOVENT HFA) 110 MCG/ACT inhaler Inhale 1 puff into the lungs 2 (two) times daily. Patient not taking: Reported on 12/08/2014 08/17/14 03/13/18  Biagio Borg, MD  indomethacin (INDOCIN) 50 MG capsule Take 1 capsule (50 mg total) by mouth 3 (three) times daily as needed. Patient taking differently: Take 50 mg by mouth 3 (three) times daily as needed for mild pain.  04/25/14   Biagio Borg, MD  nystatin (MYCOSTATIN/NYSTOP) 100000 UNIT/GM POWD Use as directed to affected area up to three times per day Patient  not taking: Reported on 08/06/2014 02/15/14   Biagio Borg, MD  omeprazole (PRILOSEC) 20 MG capsule Take 1 capsule (20 mg total) by mouth 2 (two) times daily. Patient not taking: Reported on 12/08/2014 02/06/11 03/02/15  Biagio Borg, MD  sildenafil (VIAGRA) 100 MG tablet Take 1 tablet (100 mg total) by mouth as needed for erectile dysfunction. Patient not taking: Reported on 12/08/2014 12/14/13   Biagio Borg, MD  tamsulosin [facility]) 0.4 MG CAPS capsule Take 1 capsule (0.4 mg total) by mouth daily. Patient not taking: Reported on 12/08/2014 12/14/13   Biagio Borg, MD   BP 138/84 mmHg  Pulse 93  Temp(Src) 97.8 F (36.6 C) (Oral)  Resp 18  SpO2 100% Physical Exam  Constitutional: He is oriented to person, place, and time.  Chronically ill, cachetic   HENT:  Head: Normocephalic.  Mouth/Throat: Oropharynx is clear and moist.  Eyes: Conjunctivae are normal. Pupils are equal, round, and reactive to light.  Neck: Normal range of motion. Neck supple.  Cardiovascular: Normal rate, regular rhythm and normal heart sounds.   Pulmonary/Chest:  Diminished breath sounds throughout, worse on R side. No stridor or crackles   Abdominal: Soft. Bowel sounds are normal. He exhibits no distension. There is no tenderness. There is no rebound.  Musculoskeletal: Normal range of motion. He exhibits no edema or tenderness.  Neurological: He is alert and oriented to person, place, and time. No cranial nerve deficit. Coordination normal.  Skin: Skin is warm and dry.  Psychiatric: He has a normal mood and affect. His behavior is normal. Judgment and thought content normal.  Nursing note and vitals reviewed.   ED Course  Procedures (including critical care time) Labs Review Labs Reviewed  CBC WITH DIFFERENTIAL/PLATELET - Abnormal; Notable for the following:    WBC 13.9 (*)    RBC 4.09 (*)    Hemoglobin 11.6 (*)    HCT 34.7 (*)    Neutro Abs 9.5 (*)    Monocytes Absolute 1.6 (*)    All other components  within normal limits  COMPREHENSIVE METABOLIC PANEL - Abnormal; Notable for the following:    Glucose, Bld 105 (*)    BUN 45 (*)    Creatinine, Ser 2.52 (*)    Total Protein 9.1 (*)    Albumin 3.2 (*)    AST 45 (*)    GFR calc non Af Amer 23 (*)    GFR calc Af Amer 27 (*)    All other components within normal limits  CULTURE, BLOOD (ROUTINE X 2)  CULTURE, BLOOD (ROUTINE X 2)  I-STAT TROPOININ, ED  I-STAT CG4 LACTIC ACID, ED  TYPE AND SCREEN  ABO/RH    Imaging Review Dg Chest 2 View  12/08/2014  CLINICAL DATA:  Was in shower and coughed, hemoptysis of a golf ball-sized clot of blood, was begun on antifungal medications at [facility] 2 days ago, recently diagnosed with  stage IV cancer metastatic to lungs, chest pain, recurrent shortness of breath, COPD, coronary artery disease, former smoker EXAM: CHEST  2 VIEW COMPARISON:  08/06/2014 FINDINGS: Normal heart size and pulmonary vascularity. Interval enlargement of RIGHT hilum question adenopathy. RIGHT suprahilar density in perihilar RIGHT upper lobe may either represent infiltrate pulmonary mass, or a soft tissue mass at the anterior RIGHT chest wall ; anterior aspect of the RIGHT third rib is not well visualized. Remaining lungs mildly hyperinflated but clear. Questionable nodular density versus nipple shadow lower LEFT chest. Numerous EKG leads project over chest. Remaining lungs clear. No pleural effusion or pneumothorax. Bone destruction of the lateral LEFT second rib with large extrapleural soft tissue density most likely representing metastatic disease. IMPRESSION: Suspected RIGHT hilar adenopathy/perihilar mass. RIGHT upper lobe opacity could represent pulmonary or chest wall metastatic lesion, less likely infiltrate. Additional destructive osseous metastatic lesion lateral LEFT second rib. Correlation with results of prior outside imaging recommended. Electronically Signed   By: Lavonia Dana M.D.   On: 12/08/2014 16:55   I have  personally reviewed and evaluated these images and lab results as part of my medical decision-making.   EKG Interpretation   Date/Time:  Friday December 08 2014 16:30:52 EST Ventricular Rate:  99 PR Interval:  168 QRS Duration: 88 QT Interval:  337 QTC Calculation: 432 R Axis:   78 Text Interpretation:  Sinus rhythm Anteroseptal infarct, age indeterminate  No significant change since last tracing Confirmed by Michael Walrath  MD, Thoms Barthelemy  ([id number]) on 12/08/2014 4:38:12 PM      MDM   Final diagnoses:  None   SAKIB NOGUEZ is a 74 y.o. male here with hemoptysis. Concerned that he may have bleeding from cancer vs PE vs bronchitis vs pneumonia. He has not had any radiation or chemo to the mass and follows up at [facility] so we have no oncology records here. Will check labs, CXR.   6:00 PM WBC 13. CXR showed possible pneumonia vs mass. Hg stable. Given abx. Will admit and get records from [place].     Wandra Arthurs, MD 12/08/14 [phone]

## 2014-12-08 NOTE — ED Notes (Signed)
Bed: PF29 Expected date:  Expected time:  Means of arrival:  Comments: EMS- 74yo M, "spitting up blood"/recent medication change

## 2014-12-08 NOTE — Progress Notes (Signed)
ANTIBIOTIC CONSULT NOTE - INITIAL  Pharmacy Consult for vancomycin, Levaquin Indication: rule out pneumonia  No Known Allergies  Patient Measurements: Height: '6\' 1"'$  (185.4 cm) Weight: 158 lb 1.1 oz (71.7 kg) IBW/kg (Calculated) : 79.9  Vital Signs: Temp: 97.9 F (36.6 C) (11/18 1900) Temp Source: Oral (11/18 1900) BP: 144/82 mmHg (11/18 1900) Pulse Rate: 98 (11/18 1900) Intake/Output from previous day:   Intake/Output from this shift:    Labs:  Recent Labs  12/08/14 1630  WBC 13.9*  HGB 11.6*  PLT 298  CREATININE 2.52*   Estimated Creatinine Clearance: 25.7 mL/min (by C-G formula based on Cr of 2.52). No results for input(s): VANCOTROUGH, VANCOPEAK, VANCORANDOM, GENTTROUGH, GENTPEAK, GENTRANDOM, TOBRATROUGH, TOBRAPEAK, TOBRARND, AMIKACINPEAK, AMIKACINTROU, AMIKACIN in the last 72 hours.   Microbiology: No results found for this or any previous visit (from the past 720 hour(s)).  Medical History: Past Medical History  Diagnosis Date  . ALCOHOL ABUSE 08/24/2009  . CAROTID ARTERY DISEASE 08/30/2009  . CELLULITIS AND ABSCESS OF LEG EXCEPT FOOT 11/27/2009  . COPD 08/24/2009  . CORONARY ARTERY DISEASE 08/24/2009  . 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 GOUT 08/24/2009  . HEPATITIS C 08/24/2009  . HYPERLIPIDEMIA 08/24/2009  . HYPERTENSION 08/24/2009  . KNEE PAIN 02/14/2010  . MRSA 08/24/2009  . PERIPHERAL VASCULAR DISEASE 08/24/2009  . Preventative health care 08/09/2010  . RENAL INSUFFICIENCY 08/24/2009  . SICKLE CELL TRAIT 08/24/2009  . CKD (chronic kidney disease) 02/06/2011  . GERD (gastroesophageal reflux disease) 02/06/2011  . BPH (benign prostatic hyperplasia) 02/06/2011  . Degenerative joint disease 08/10/2011    Medications:  Scheduled:  . sodium chloride   Intravenous STAT  . ipratropium-albuterol  3 mL Nebulization 3 times per day  . levofloxacin (LEVAQUIN) IV  750 mg Intravenous Once   Infusions:     Assessment: 74 yo male presents to ER with CC hematemesis. PMH  includes stage IV lung cancer, CAD, and alcohol abuse. Per notes, cancer diagnosed about a month ago but patient has not had treatment yet and patient also states some sort of "mold" was found in his chest but patient is not an antifungal. Concern for bleeding from cancer vs PE vs PNA. To start vancomycin and Levaquin per pharmacy for PNA treatment. At baseline, SCr elevated, WBC elevated, and afebrile  Goal of Therapy:  Vancomycin trough level 15-20 mcg/ml  Plan:  1) Vancomycin '750mg'$  IV q24 per current weight and renal function 2) Levaquin '750mg'$  IV q48 for CrCl 20-49 ml/min   Adrian Saran, PharmD, BCPS Pager [phone] 12/08/2014 7:31 PM

## 2014-12-08 NOTE — ED Notes (Addendum)
Pt reports diagnosed with adenocarcinoma of unknown origin 2 months ago; placed on voriconazole for "mold" found via biopsy 2 days ago; noted blood in sputum this morning. Pt also reports chronic left chest pain onset with diagnosis.

## 2014-12-08 NOTE — ED Notes (Signed)
PT CAN GO TO FLOOR AT 18:30

## 2014-12-08 NOTE — ED Notes (Signed)
Pt transported to DG.  

## 2014-12-09 DIAGNOSIS — C7931 Secondary malignant neoplasm of brain: Secondary | ICD-10-CM

## 2014-12-09 DIAGNOSIS — C3490 Malignant neoplasm of unspecified part of unspecified bronchus or lung: Secondary | ICD-10-CM | POA: Diagnosis not present

## 2014-12-09 DIAGNOSIS — R042 Hemoptysis: Secondary | ICD-10-CM | POA: Diagnosis not present

## 2014-12-09 DIAGNOSIS — E43 Unspecified severe protein-calorie malnutrition: Secondary | ICD-10-CM

## 2014-12-09 DIAGNOSIS — N179 Acute kidney failure, unspecified: Secondary | ICD-10-CM | POA: Diagnosis not present

## 2014-12-09 LAB — BASIC METABOLIC PANEL
ANION GAP: 10 (ref 5–15)
BUN: 39 mg/dL — ABNORMAL HIGH (ref 6–20)
CALCIUM: 9.2 mg/dL (ref 8.9–10.3)
CO2: 23 mmol/L (ref 22–32)
Chloride: 104 mmol/L (ref 101–111)
Creatinine, Ser: 2.34 mg/dL — ABNORMAL HIGH (ref 0.61–1.24)
GFR calc Af Amer: 30 mL/min — ABNORMAL LOW (ref >60)
GFR, EST NON AFRICAN AMERICAN: 26 mL/min — AB (ref >60)
GLUCOSE: 96 mg/dL (ref 65–99)
Potassium: 4.8 mmol/L (ref 3.5–5.1)
SODIUM: 137 mmol/L (ref 135–145)

## 2014-12-09 LAB — CBC
HCT: 31.9 % — ABNORMAL LOW (ref 39.0–52.0)
Hemoglobin: 10.5 g/dL — ABNORMAL LOW (ref 13.0–17.0)
MCH: 27.8 pg (ref 26.0–34.0)
MCHC: 32.9 g/dL (ref 30.0–36.0)
MCV: 84.4 fL (ref 78.0–100.0)
PLATELETS: 261 10*3/uL (ref 150–400)
RBC: 3.78 MIL/uL — AB (ref 4.22–5.81)
RDW: 12.1 % (ref 11.5–15.5)
WBC: 12.1 10*3/uL — AB (ref 4.0–10.5)

## 2014-12-09 LAB — EXPECTORATED SPUTUM ASSESSMENT W REFEX TO RESP CULTURE

## 2014-12-09 LAB — PROTIME-INR
INR: 1.19 (ref 0.00–1.49)
Prothrombin Time: 15.3 seconds — ABNORMAL HIGH (ref 11.6–15.2)

## 2014-12-09 MED ORDER — ENSURE ENLIVE PO LIQD: 237.0000 mL | Freq: Two times a day (BID) | ORAL | Status: AC

## 2014-12-09 MED ORDER — IPRATROPIUM-ALBUTEROL 0.5-2.5 (3) MG/3ML IN SOLN
3.0000 mL | Freq: Two times a day (BID) | RESPIRATORY_TRACT | Status: DC
  Administered 2014-12-09: 3 mL via RESPIRATORY_TRACT
  Filled 2014-12-09: qty 3

## 2014-12-09 NOTE — Care Management Obs Status (Signed)
[facility] NOTIFICATION   Patient Details  Name: Alan Franco MRN: [id number] Date of Birth: [birth year]   Medicare Observation Status Notification Given:  Yes    Apolonio Schneiders, RN 12/09/2014, 11:01 AM

## 2014-12-09 NOTE — Discharge Summary (Signed)
Discharge Summary  Alan Franco DVV:[id number] DOB: [birth year]  PCP: Alan Cower, Franco  Admit date: 12/08/2014 Discharge date: 12/09/2014  Time spent: >8mns  Recommendations for Outpatient Follow-up:  1. F/u with oncology Alan. sReva Franco 11/23, Alan Franco follow up on sputum culture result.  Discharge Diagnoses:  Active Hospital Problems   Diagnosis Date Noted  . Hemoptysis 12/08/2014    Resolved Hospital Problems   Diagnosis Date Noted Date Resolved  No resolved problems to display.    Discharge Condition: stable  Diet recommendation: heart healthy  Filed Weights   12/08/14 1900  Weight: 158 lb 1.1 oz (71.7 kg)    History of present illness:  Alan GEERis a 74y.o. 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  He reported that he had a biopsy to his chest wall ,was told to have fungal infection, [place]. He reported has been feeling sob with activity for few years, does use inhaler.  cxr obtained from the ED is consistent with metastatic lung ca, lab showed wbc 13.9, hgb 11.6, bnu 45, cr 2.52. Lactic acid wnl.  Hospital Course:  Active Problems:   Hemoptysis  Hemoptysis: reported streaks of blood mixed with sputum. likely from stage iv lung ca,  Patient is admitted under observation status,vital and hgb stable,  sputum culture pending and mrsa screening negative,  He received one dose of vanc and levaquin empirically, he does not have fever, patient appear nontoxic, no sob, no chest pain. He is discharged home with stable condition without abx, he is to follow up with oncology on 11/23.  ARF on CKD III, cr 2.5 (baseline around 1.6 to 1.8)  ua  unremarkable, denies urinary symptom, gentle hydration. Cr improved, encourage oral intake  Leukocytosis: no toxic, from dehydration? Recent steroid use? Better with hydration, lactic acid wnl.  Copd stable, no wheezing, nebs and home meds.  Stage iv lung ca, unclear detail, [facility], patient report he is going to get first chemo on 11/23.  H/o hepc. Reported no prior treatment, [facility]  [place]: [place] [facility], [facility]  Severe malnutrition: nutrition supplement.   DVT prophylaxis: scd's  Consultants: none  Code Status: DNR  Family Communication: Patient and wife   Procedures:  none   Discharge Exam: BP 120/73 mmHg  Pulse 95  Temp(Src) 98.7 F (37.1 C) (Oral)  Resp 18  Ht '6\' 1"'$  (1.854 m)  Wt 158 lb 1.1 oz (71.7 kg)  BMI 20.86 kg/m2  SpO2 97%   General: Aaox3, NAD  Eyes: PERRL  ENT: unremarkable  Neck: supple, no JVD  Cardiovascular: RRR  Respiratory: CTABL, palpable chest wall mass, nontender  Abdomen: soft/ND/ND, positive bowel sounds  Skin: no rash  Musculoskeletal: No edema  Psychiatric: calm/cooperative  Neurologic: no focal findings    Discharge Instructions You were cared for by a hospitalist during your hospital stay. If you have any questions about your discharge medications or the care you received while you were in the hospital after you are discharged, you can call  the unit and asked to speak with the hospitalist on call if the hospitalist that took care of you is not available. Once you are discharged, your primary care physician will handle any further medical issues. Please note that NO REFILLS for any discharge medications will be authorized once you are discharged, as it is imperative that you return to your primary care physician (or establish a relationship  with a primary care physician if you do not have one) for your aftercare needs so that they can reassess your need for medications and monitor your lab values.  Discharge Instructions    Diet - low sodium heart healthy    Complete by:  As directed      Increase activity slowly    Complete by:  As directed             Medication List    STOP taking these medications        amLODipine 10 MG tablet  Commonly known as:  NORVASC     atorvastatin 10 MG tablet  Commonly known as:  LIPITOR     celecoxib 100 MG capsule  Commonly known as:  CELEBREX     sildenafil 100 MG tablet  Commonly known as:  VIAGRA      TAKE these medications        allopurinol 100 MG tablet  Commonly known as:  ZYLOPRIM  Take 1 tablet (100 mg total) by mouth daily as needed. Gout     feeding supplement (ENSURE ENLIVE) Liqd  Take 237 mLs by mouth 2 (two) times daily between meals.     fluticasone 110 MCG/ACT inhaler  Commonly known as:  FLOVENT HFA  Inhale 1 puff into the lungs 2 (two) times daily.     indomethacin 50 MG capsule  Commonly known as:  INDOCIN  Take 1 capsule (50 mg total) by mouth 3 (three) times daily as needed.     Ipratropium-Albuterol 20-100 MCG/ACT Aers respimat  Commonly known as:  COMBIVENT RESPIMAT  Inhale 1 puff into the lungs every 6 (six) hours as needed for wheezing.     nystatin 100000 UNIT/GM Powd  Use as directed to affected area up to three times per day     omeprazole 20 MG capsule  Commonly known as:  PRILOSEC  Take 1 capsule (20 mg total) by mouth 2 (two) times daily.     tamsulosin 0.4 MG Caps capsule  Commonly known as:  FLOMAX  Take 1 capsule (0.4 mg total) by mouth daily.     voriconazole 200 MG tablet  Commonly known as:  VFEND  Take 200 mg by mouth 2 (two) times daily.       No Known Allergies     Follow-up Information    Follow up with Alan Franco In 1 week.   Specialty:  Hematology and Oncology   [facility]   Contact  information:   [address] [place]  [id number] [phone]        The results of significant diagnostics from this hospitalization (including imaging, microbiology, ancillary and laboratory) are listed below for reference.    Significant Diagnostic Studies: Dg Chest 2 View  12/08/2014  CLINICAL DATA:  Was in shower and coughed, hemoptysis of a golf ball-sized clot of blood, was begun on antifungal medications at [facility] 2 days ago, recently diagnosed with stage IV cancer metastatic to lungs, chest pain, recurrent shortness of breath, COPD, coronary artery disease, former smoker EXAM:  CHEST  2 VIEW COMPARISON:  08/06/2014 FINDINGS: Normal heart size and pulmonary vascularity. Interval enlargement of RIGHT hilum question adenopathy. RIGHT suprahilar density in perihilar RIGHT upper lobe may either represent infiltrate pulmonary mass, or a soft tissue mass at the anterior RIGHT chest wall ; anterior aspect of the RIGHT third rib is not well visualized. Remaining lungs mildly hyperinflated but clear. Questionable nodular density versus nipple shadow lower LEFT chest. Numerous EKG leads project over chest. Remaining lungs clear. No pleural effusion or pneumothorax. Bone destruction of the lateral LEFT second rib with large extrapleural soft tissue density most likely representing metastatic disease. IMPRESSION: Suspected RIGHT hilar adenopathy/perihilar mass. RIGHT upper lobe opacity could represent pulmonary or chest wall metastatic lesion, less likely infiltrate. Additional destructive osseous metastatic lesion lateral LEFT second rib. Correlation with results of prior outside imaging recommended. Electronically Signed   By: Lavonia Dana M.D.   On: 12/08/2014 16:55    Microbiology: Recent Results (from the past 240 hour(s))  MRSA PCR Screening     Status: None   Collection Time: 12/08/14  7:07 PM  Result Value Ref Range Status   MRSA by PCR NEGATIVE NEGATIVE Final    Comment:          The GeneXpert MRSA Assay (FDA approved for NASAL specimens only), is one component of a comprehensive MRSA colonization surveillance program. It is not intended to diagnose MRSA infection nor to guide or monitor treatment for MRSA infections.   Blood culture (routine x 2)     Status: None (Preliminary result)   Collection Time: 12/08/14  7:55 PM  Result Value Ref Range Status   Specimen Description BLOOD LEFT ARM  Final   Special Requests IN PEDIATRIC BOTTLE  0.5CC  Final   Culture PENDING  Incomplete   Report Status PENDING  Incomplete     Labs: Basic Metabolic Panel:  Recent Labs Lab 12/08/14 1630 12/09/14 0535  NA 136 137  K 4.5 4.8  CL 102 104  CO2 25 23  GLUCOSE 105* 96  BUN 45* 39*  CREATININE 2.52* 2.34*  CALCIUM 9.8 9.2   Liver Function Tests:  Recent Labs Lab 12/08/14 1630  AST 45*  ALT 23  ALKPHOS 96  BILITOT 0.4  PROT 9.1*  ALBUMIN 3.2*   No results for input(s): LIPASE, AMYLASE in the last 168 hours. No results for input(s): AMMONIA in the last 168 hours. CBC:  Recent Labs Lab 12/08/14 1630 12/09/14 0535  WBC 13.9* 12.1*  NEUTROABS 9.5*  --   HGB 11.6* 10.5*  HCT 34.7* 31.9*  MCV 84.8 84.4  PLT 298 261   Cardiac Enzymes: No results for input(s): CKTOTAL, CKMB, CKMBINDEX, TROPONINI in the last 168 hours. BNP: BNP (last 3 results) No results for input(s): BNP in the last 8760 hours.  ProBNP (last 3 results) No results for input(s): PROBNP in the last 8760 hours.  CBG: No results for input(s): GLUCAP in the last 168 hours.     SignedFlorencia Reasons MD, PhD  [facility] 12/09/2014, 10:31 AM

## 2014-12-11 ENCOUNTER — Telehealth: Payer: Self-pay | Admitting: *Deleted

## 2014-12-11 NOTE — Telephone Encounter (Signed)
Pt was on tcm list admitted for hemoptysis D/C 12/09/14 and was told to f/u with hematologist Dr. Merrie Roof in 1 week...Alan Franco

## 2014-12-12 LAB — CULTURE, RESPIRATORY W GRAM STAIN
Culture: NORMAL
Gram Stain: NONE SEEN

## 2014-12-12 LAB — CULTURE, RESPIRATORY

## 2014-12-13 LAB — CULTURE, BLOOD (ROUTINE X 2)
CULTURE: NO GROWTH
Culture: NO GROWTH

## 2015-01-26 ENCOUNTER — Inpatient Hospital Stay (HOSPITAL_COMMUNITY)
Admission: EM | Admit: 2015-01-26 | Discharge: 2015-02-01 | DRG: 871 | Disposition: A | Discharge status: Home / Self Care | Payer: Medicare HMO | Attending: Internal Medicine | Admitting: Internal Medicine

## 2015-01-26 ENCOUNTER — Encounter (HOSPITAL_COMMUNITY): Payer: Self-pay

## 2015-01-26 ENCOUNTER — Emergency Department (HOSPITAL_COMMUNITY): Payer: Medicare HMO

## 2015-01-26 DIAGNOSIS — J189 Pneumonia, unspecified organism: Secondary | ICD-10-CM | POA: Diagnosis present

## 2015-01-26 DIAGNOSIS — I129 Hypertensive chronic kidney disease with stage 1 through stage 4 chronic kidney disease, or unspecified chronic kidney disease: Secondary | ICD-10-CM | POA: Diagnosis present

## 2015-01-26 DIAGNOSIS — E43 Unspecified severe protein-calorie malnutrition: Secondary | ICD-10-CM | POA: Diagnosis present

## 2015-01-26 DIAGNOSIS — R109 Unspecified abdominal pain: Secondary | ICD-10-CM

## 2015-01-26 DIAGNOSIS — I10 Essential (primary) hypertension: Secondary | ICD-10-CM | POA: Diagnosis present

## 2015-01-26 DIAGNOSIS — I739 Peripheral vascular disease, unspecified: Secondary | ICD-10-CM | POA: Diagnosis present

## 2015-01-26 DIAGNOSIS — J441 Chronic obstructive pulmonary disease with (acute) exacerbation: Secondary | ICD-10-CM | POA: Diagnosis present

## 2015-01-26 DIAGNOSIS — C3490 Malignant neoplasm of unspecified part of unspecified bronchus or lung: Secondary | ICD-10-CM | POA: Diagnosis present

## 2015-01-26 DIAGNOSIS — I2510 Atherosclerotic heart disease of native coronary artery without angina pectoris: Secondary | ICD-10-CM | POA: Diagnosis present

## 2015-01-26 DIAGNOSIS — G9341 Metabolic encephalopathy: Secondary | ICD-10-CM | POA: Diagnosis present

## 2015-01-26 DIAGNOSIS — N183 Chronic kidney disease, stage 3 (moderate): Secondary | ICD-10-CM | POA: Diagnosis present

## 2015-01-26 DIAGNOSIS — D573 Sickle-cell trait: Secondary | ICD-10-CM | POA: Diagnosis present

## 2015-01-26 DIAGNOSIS — R040 Epistaxis: Secondary | ICD-10-CM | POA: Diagnosis present

## 2015-01-26 DIAGNOSIS — A09 Infectious gastroenteritis and colitis, unspecified: Secondary | ICD-10-CM | POA: Diagnosis not present

## 2015-01-26 DIAGNOSIS — E785 Hyperlipidemia, unspecified: Secondary | ICD-10-CM | POA: Diagnosis present

## 2015-01-26 DIAGNOSIS — C7931 Secondary malignant neoplasm of brain: Secondary | ICD-10-CM | POA: Diagnosis present

## 2015-01-26 DIAGNOSIS — J440 Chronic obstructive pulmonary disease with acute lower respiratory infection: Secondary | ICD-10-CM | POA: Diagnosis present

## 2015-01-26 DIAGNOSIS — K219 Gastro-esophageal reflux disease without esophagitis: Secondary | ICD-10-CM | POA: Diagnosis present

## 2015-01-26 DIAGNOSIS — Z87891 Personal history of nicotine dependence: Secondary | ICD-10-CM

## 2015-01-26 DIAGNOSIS — N179 Acute kidney failure, unspecified: Secondary | ICD-10-CM | POA: Diagnosis present

## 2015-01-26 DIAGNOSIS — C7972 Secondary malignant neoplasm of left adrenal gland: Secondary | ICD-10-CM | POA: Diagnosis present

## 2015-01-26 DIAGNOSIS — A419 Sepsis, unspecified organism: Principal | ICD-10-CM | POA: Diagnosis present

## 2015-01-26 DIAGNOSIS — C7951 Secondary malignant neoplasm of bone: Secondary | ICD-10-CM | POA: Diagnosis present

## 2015-01-26 DIAGNOSIS — R000 Tachycardia, unspecified: Secondary | ICD-10-CM | POA: Diagnosis present

## 2015-01-26 DIAGNOSIS — R197 Diarrhea, unspecified: Secondary | ICD-10-CM | POA: Diagnosis present

## 2015-01-26 DIAGNOSIS — R509 Fever, unspecified: Secondary | ICD-10-CM | POA: Diagnosis present

## 2015-01-26 DIAGNOSIS — J449 Chronic obstructive pulmonary disease, unspecified: Secondary | ICD-10-CM | POA: Diagnosis not present

## 2015-01-26 LAB — URINALYSIS, ROUTINE W REFLEX MICROSCOPIC
Bilirubin Urine: NEGATIVE
Glucose, UA: NEGATIVE mg/dL
KETONES UR: NEGATIVE mg/dL
LEUKOCYTES UA: NEGATIVE
NITRITE: NEGATIVE
PROTEIN: NEGATIVE mg/dL
Specific Gravity, Urine: 1.014 (ref 1.005–1.030)
pH: 5 (ref 5.0–8.0)

## 2015-01-26 LAB — URINE MICROSCOPIC-ADD ON

## 2015-01-26 LAB — CBC WITH DIFFERENTIAL/PLATELET
BASOS ABS: 0 10*3/uL (ref 0.0–0.1)
BASOS PCT: 0 %
Eosinophils Absolute: 0 10*3/uL (ref 0.0–0.7)
Eosinophils Relative: 0 %
HEMATOCRIT: 30.6 % — AB (ref 39.0–52.0)
HEMOGLOBIN: 9.9 g/dL — AB (ref 13.0–17.0)
Lymphocytes Relative: 20 %
Lymphs Abs: 1.5 10*3/uL (ref 0.7–4.0)
MCH: 26.5 pg (ref 26.0–34.0)
MCHC: 32.4 g/dL (ref 30.0–36.0)
MCV: 82 fL (ref 78.0–100.0)
MONOS PCT: 7 %
Monocytes Absolute: 0.5 10*3/uL (ref 0.1–1.0)
NEUTROS ABS: 5.8 10*3/uL (ref 1.7–7.7)
NEUTROS PCT: 73 %
Platelets: 345 10*3/uL (ref 150–400)
RBC: 3.73 MIL/uL — AB (ref 4.22–5.81)
RDW: 14.8 % (ref 11.5–15.5)
WBC: 7.9 10*3/uL (ref 4.0–10.5)

## 2015-01-26 LAB — COMPREHENSIVE METABOLIC PANEL
ALK PHOS: 124 U/L (ref 38–126)
ALT: 31 U/L (ref 17–63)
ANION GAP: 10 (ref 5–15)
AST: 52 U/L — ABNORMAL HIGH (ref 15–41)
Albumin: 3 g/dL — ABNORMAL LOW (ref 3.5–5.0)
BUN: 33 mg/dL — ABNORMAL HIGH (ref 6–20)
CALCIUM: 9 mg/dL (ref 8.9–10.3)
CHLORIDE: 109 mmol/L (ref 101–111)
CO2: 21 mmol/L — AB (ref 22–32)
Creatinine, Ser: 1.36 mg/dL — ABNORMAL HIGH (ref 0.61–1.24)
GFR calc non Af Amer: 49 mL/min — ABNORMAL LOW (ref >60)
GFR, EST AFRICAN AMERICAN: 57 mL/min — AB (ref >60)
Glucose, Bld: 94 mg/dL (ref 65–99)
Potassium: 4.3 mmol/L (ref 3.5–5.1)
SODIUM: 140 mmol/L (ref 135–145)
Total Bilirubin: 0.2 mg/dL — ABNORMAL LOW (ref 0.3–1.2)
Total Protein: 8.2 g/dL — ABNORMAL HIGH (ref 6.5–8.1)

## 2015-01-26 LAB — LIPASE, BLOOD: Lipase: 20 U/L (ref 11–51)

## 2015-01-26 LAB — I-STAT CG4 LACTIC ACID, ED: LACTIC ACID, VENOUS: 1.53 mmol/L (ref 0.5–2.0)

## 2015-01-26 MED ORDER — METRONIDAZOLE IN NACL 5-0.79 MG/ML-% IV SOLN
500.0000 mg | Freq: Once | INTRAVENOUS | Status: AC
  Administered 2015-01-26: 500 mg via INTRAVENOUS
  Filled 2015-01-26: qty 100

## 2015-01-26 MED ORDER — VANCOMYCIN HCL IN DEXTROSE 750-5 MG/150ML-% IV SOLN
750.0000 mg | Freq: Two times a day (BID) | INTRAVENOUS | Status: DC
Start: 2015-01-26 — End: 2015-01-29
  Administered 2015-01-26 – 2015-01-29 (×6): 750 mg via INTRAVENOUS
  Filled 2015-01-26 (×7): qty 150

## 2015-01-26 MED ORDER — SODIUM CHLORIDE 0.9 % IV BOLUS (SEPSIS)
1000.0000 mL | Freq: Once | INTRAVENOUS | Status: AC
  Administered 2015-01-26: 1000 mL via INTRAVENOUS

## 2015-01-26 MED ORDER — LEVOFLOXACIN IN D5W 750 MG/150ML IV SOLN
750.0000 mg | Freq: Once | INTRAVENOUS | Status: AC
  Administered 2015-01-26: 750 mg via INTRAVENOUS
  Filled 2015-01-26 (×2): qty 150

## 2015-01-26 MED ORDER — IPRATROPIUM-ALBUTEROL 0.5-2.5 (3) MG/3ML IN SOLN: 3.0000 mL | Freq: Four times a day (QID) | RESPIRATORY_TRACT | Status: DC | PRN

## 2015-01-26 MED ORDER — METRONIDAZOLE IN NACL 5-0.79 MG/ML-% IV SOLN
500.0000 mg | Freq: Three times a day (TID) | INTRAVENOUS | Status: DC
  Administered 2015-01-27 – 2015-02-01 (×16): 500 mg via INTRAVENOUS
  Filled 2015-01-26 (×16): qty 100

## 2015-01-26 MED ORDER — ENSURE ENLIVE PO LIQD
237.0000 mL | Freq: Two times a day (BID) | ORAL | Status: DC
  Administered 2015-01-27 (×2): 237 mL via ORAL

## 2015-01-26 MED ORDER — ENOXAPARIN SODIUM 40 MG/0.4ML ~~LOC~~ SOLN
40.0000 mg | SUBCUTANEOUS | Status: DC
  Administered 2015-01-26 – 2015-01-27 (×2): 40 mg via SUBCUTANEOUS
  Filled 2015-01-26 (×2): qty 0.4

## 2015-01-26 MED ORDER — ZOLPIDEM TARTRATE 5 MG PO TABS
5.0000 mg | ORAL_TABLET | Freq: Every evening | ORAL | Status: DC | PRN
  Administered 2015-01-31: 5 mg via ORAL
  Filled 2015-01-26: qty 1

## 2015-01-26 MED ORDER — ACETAMINOPHEN 325 MG PO TABS
650.0000 mg | ORAL_TABLET | Freq: Once | ORAL | Status: AC
  Administered 2015-01-26: 650 mg via ORAL
  Filled 2015-01-26: qty 2

## 2015-01-26 MED ORDER — ACETAMINOPHEN 650 MG RE SUPP: 650.0000 mg | Freq: Four times a day (QID) | RECTAL | Status: DC | PRN

## 2015-01-26 MED ORDER — KCL IN DEXTROSE-NACL 20-5-0.9 MEQ/L-%-% IV SOLN
INTRAVENOUS | Status: AC
  Administered 2015-01-26 – 2015-01-28 (×3): via INTRAVENOUS
  Filled 2015-01-26 (×5): qty 1000

## 2015-01-26 MED ORDER — FAMOTIDINE IN NACL 20-0.9 MG/50ML-% IV SOLN
20.0000 mg | Freq: Two times a day (BID) | INTRAVENOUS | Status: DC
  Administered 2015-01-26 – 2015-02-01 (×12): 20 mg via INTRAVENOUS
  Filled 2015-01-26 (×13): qty 50

## 2015-01-26 MED ORDER — IOHEXOL 300 MG/ML  SOLN: 25.0000 mL | Freq: Once | INTRAMUSCULAR | Status: DC | PRN

## 2015-01-26 MED ORDER — MORPHINE SULFATE (PF) 2 MG/ML IV SOLN
2.0000 mg | INTRAVENOUS | Status: DC | PRN
  Administered 2015-01-28 – 2015-02-01 (×12): 2 mg via INTRAVENOUS
  Filled 2015-01-26 (×12): qty 1

## 2015-01-26 MED ORDER — LEVOFLOXACIN IN D5W 750 MG/150ML IV SOLN: 750.0000 mg | INTRAVENOUS | Status: DC

## 2015-01-26 MED ORDER — ACETAMINOPHEN 325 MG PO TABS
650.0000 mg | ORAL_TABLET | Freq: Four times a day (QID) | ORAL | Status: DC | PRN
  Administered 2015-01-27 (×2): 650 mg via ORAL
  Filled 2015-01-26 (×2): qty 2

## 2015-01-26 MED ORDER — ALUM & MAG HYDROXIDE-SIMETH 200-200-20 MG/5ML PO SUSP: 30.0000 mL | Freq: Four times a day (QID) | ORAL | Status: DC | PRN

## 2015-01-26 MED ORDER — HYDROCODONE-ACETAMINOPHEN 5-325 MG PO TABS: 1.0000 | ORAL_TABLET | ORAL | Status: DC | PRN

## 2015-01-26 MED ORDER — ONDANSETRON HCL 4 MG PO TABS: 4.0000 mg | ORAL_TABLET | Freq: Four times a day (QID) | ORAL | Status: DC | PRN

## 2015-01-26 MED ORDER — ONDANSETRON HCL 4 MG/2ML IJ SOLN: 4.0000 mg | Freq: Four times a day (QID) | INTRAMUSCULAR | Status: DC | PRN

## 2015-01-26 NOTE — ED Notes (Signed)
Accessed pt R side chest port for labs and meds. Pt did not have port placed at this facility but has been verified by xray and Alan Franco

## 2015-01-26 NOTE — ED Provider Notes (Signed)
CSN: [id number]     Arrival date & time 01/26/15  1112 History   First MD Initiated Contact with Patient 01/26/15 1140     Chief Complaint  Patient presents with  . Diarrhea     (Consider location/radiation/quality/duration/timing/severity/associated sxs/prior Treatment) HPI  75 year old male with a history of stage IV lung cancer presents with diarrhea since yesterday morning. He states he's got a total of 15 times. Each is loose, and watery. No blood. Has not had any fevers but has had low temperature and chills. Associated lower abdominal pain. He describes his pain as mild. Patient's last chemotherapy treatment was 3 days ago. No nausea or vomiting. Does endorse a cough, unclear for how long. Patient was treated in November 2016 for pneumonia and states he has had antibiotics since.  Past Medical History  Diagnosis Date  . ALCOHOL ABUSE 08/24/2009  . CAROTID ARTERY DISEASE 08/30/2009  . CELLULITIS AND ABSCESS OF LEG EXCEPT FOOT 11/27/2009  . COPD 08/24/2009  . CORONARY ARTERY DISEASE 08/24/2009  . 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 GOUT 08/24/2009  . HEPATITIS C 08/24/2009  . HYPERLIPIDEMIA 08/24/2009  . HYPERTENSION 08/24/2009  . KNEE PAIN 02/14/2010  . MRSA 08/24/2009  . PERIPHERAL VASCULAR DISEASE 08/24/2009  . Preventative health care 08/09/2010  . RENAL INSUFFICIENCY 08/24/2009  . SICKLE CELL TRAIT 08/24/2009  . CKD (chronic kidney disease) 02/06/2011  . GERD (gastroesophageal reflux disease) 02/06/2011  . BPH (benign prostatic hyperplasia) 02/06/2011  . Degenerative joint disease 08/10/2011   Past Surgical History  Procedure Laterality Date  . Knee surgury      arthoscopic on the right, Dr. French Ana  . Hand surgery Right    Family History  Problem Relation Age of Onset  . Alcohol abuse Mother   . Alcohol abuse Father   . Alzheimer's disease Father   . Colon cancer Neg Hx   . Cancer Sister    Social History  Substance Use Topics  . Smoking status: Former Research scientist (life sciences)  . Smokeless  tobacco: Never Used  . Alcohol Use: No    Review of Systems  Constitutional: Positive for chills. Negative for fever.  Respiratory: Positive for cough.   Cardiovascular: Negative for chest pain.  Gastrointestinal: Positive for abdominal pain and diarrhea. Negative for nausea, vomiting and blood in stool.  Neurological: Positive for weakness.  All other systems reviewed and are negative.     Allergies  Review of patient's allergies indicates no known allergies.  Home Medications   Prior to Admission medications   Medication Sig Start Date End Date Taking? Authorizing Provider  allopurinol (ZYLOPRIM) 100 MG tablet Take 1 tablet (100 mg total) by mouth daily as needed. Gout 02/27/14 02/27/15  Janith Lima, MD  feeding supplement, ENSURE ENLIVE, (ENSURE ENLIVE) LIQD Take 237 mLs by mouth 2 (two) times daily between meals. 12/09/14   Florencia Reasons, MD  fluticasone (FLOVENT HFA) 110 MCG/ACT inhaler Inhale 1 puff into the lungs 2 (two) times daily. Patient not taking: Reported on 12/08/2014 08/17/14 03/13/18  Biagio Borg, MD  indomethacin (INDOCIN) 50 MG capsule Take 1 capsule (50 mg total) by mouth 3 (three) times daily as needed. Patient taking differently: Take 50 mg by mouth 3 (three) times daily as needed for mild pain.  04/25/14   Biagio Borg, MD  Ipratropium-Albuterol (COMBIVENT RESPIMAT) 20-100 MCG/ACT AERS respimat Inhale 1 puff into the lungs every 6 (six) hours as needed for wheezing. 11/02/12   Biagio Borg, MD  nystatin (MYCOSTATIN/NYSTOP) 100000 UNIT/GM  POWD Use as directed to affected area up to three times per day Patient not taking: Reported on 08/06/2014 02/15/14   Biagio Borg, MD  omeprazole (PRILOSEC) 20 MG capsule Take 1 capsule (20 mg total) by mouth 2 (two) times daily. Patient not taking: Reported on 12/08/2014 02/06/11 03/02/15  Biagio Borg, MD  tamsulosin (FLOMAX) 0.4 MG CAPS capsule Take 1 capsule (0.4 mg total) by mouth daily. Patient not taking: Reported on 12/08/2014  12/14/13   Biagio Borg, MD  voriconazole (VFEND) 200 MG tablet Take 200 mg by mouth 2 (two) times daily.    Historical Provider, MD   BP 151/102 mmHg  Pulse 112  Temp(Src) 97.2 F (36.2 C) (Oral)  Resp 17  Ht 6' 1" (1.854 m)  Wt 157 lb (71.215 kg)  BMI 20.72 kg/m2  SpO2 100% Physical Exam  Constitutional: He is oriented to person, place, and time. He appears well-developed and well-nourished.  HENT:  Head: Normocephalic and atraumatic.  Right Ear: External ear normal.  Left Ear: External ear normal.  Nose: Nose normal.  Mouth/Throat: Mucous membranes are dry.  Eyes: Right eye exhibits no discharge. Left eye exhibits no discharge.  Neck: Neck supple.  Cardiovascular: Regular rhythm, normal heart sounds and intact distal pulses.  Tachycardia present.   Pulmonary/Chest: Effort normal and breath sounds normal.  Abdominal: Soft. There is tenderness.  Non focal lower abdominal tenderness  Musculoskeletal: He exhibits no edema.  Neurological: He is alert and oriented to person, place, and time.  Skin: Skin is warm and dry.  Nursing note and vitals reviewed.   ED Course  Procedures (including critical care time) Labs Review Labs Reviewed  COMPREHENSIVE METABOLIC PANEL - Abnormal; Notable for the following:    CO2 21 (*)    BUN 33 (*)    Creatinine, Ser 1.36 (*)    Total Protein 8.2 (*)    Albumin 3.0 (*)    AST 52 (*)    Total Bilirubin 0.2 (*)    GFR calc non Af Amer 49 (*)    GFR calc Af Amer 57 (*)    All other components within normal limits  CBC WITH DIFFERENTIAL/PLATELET - Abnormal; Notable for the following:    RBC 3.73 (*)    Hemoglobin 9.9 (*)    HCT 30.6 (*)    All other components within normal limits  URINALYSIS, ROUTINE W REFLEX MICROSCOPIC (NOT AT [facility]) - Abnormal; Notable for the following:    Hgb urine dipstick TRACE (*)    All other components within normal limits  URINE MICROSCOPIC-ADD ON - Abnormal; Notable for the following:    Squamous Epithelial  / LPF 0-5 (*)    Bacteria, UA FEW (*)    Casts GRANULAR CAST (*)    All other components within normal limits  GASTROINTESTINAL PANEL BY PCR, STOOL (REPLACES STOOL CULTURE)  C DIFFICILE QUICK SCREEN W PCR REFLEX  CULTURE, BLOOD (ROUTINE X 2)  CULTURE, BLOOD (ROUTINE X 2)  LIPASE, BLOOD  I-STAT CG4 LACTIC ACID, ED    Imaging Review Ct Abdomen Pelvis Wo Contrast  01/26/2015  CLINICAL DATA:  Lung cancer patient with metastatic disease to the brain. Diarrhea beginning in the last couple of days. EXAM: CT ABDOMEN AND PELVIS WITHOUT CONTRAST TECHNIQUE: Multidetector CT imaging of the abdomen and pelvis was performed following the standard protocol without IV contrast. COMPARISON:  None. FINDINGS: The liver has a normal appearance without contrast. No calcified gallstones. The spleen is normal. The pancreas is  normal. The right adrenal gland is normal. There is a left adrenal mass measuring at least 4 cm in size highly likely to be a metastatic lesion. No renal lesion is seen. No stone or hydronephrosis. The aorta and its branch vessels show atherosclerosis but there is no aneurysm. The IVC is normal. No retroperitoneal mass or lymphadenopathy. No free fluid or air. The bladder appears normal. No evidence of bowel pathology to explain diarrhea. Lytic destructive mass of the left acetabulum with extension into the superior ramus. IMPRESSION: No abnormality seen to explain diarrhea. No evidence of intrinsic bowel pathology. Left adrenal metastasis. Left acetabular metastasis which could result in pathologic fracture. Electronically Signed   By: Nelson Chimes M.D.   On: 01/26/2015 15:05   Dg Chest 2 View  01/26/2015  CLINICAL DATA:  Cough.  Hypothermia.  COPD.  History of lung cancer. EXAM: CHEST  2 VIEW COMPARISON:  12/08/2014 FINDINGS: Heart size normal.  Negative for heart failure. Port-A-Cath tip in the SVC from right jugular approach. Destructive lesion of the left second rib with extrapleural soft tissue  mass. This may show mild progression in the interval. Ill-defined density overlying the right upper lobe may be related to a mass lesion of the right anterior third rib. This may also have progressed. Negative for pneumonia or effusion. IMPRESSION: Destructive lesion with soft tissue mass involving the left second rib with progression. There is a probable similar lesion of the right anterior third rib. This may be due to multiple myeloma or metastatic disease. Chest CT recommended for further evaluation. Negative for heart failure or pneumonia. Electronically Signed   By: Franchot Gallo M.D.   On: 01/26/2015 13:12   I have personally reviewed and evaluated these images and lab results as part of my medical decision-making.   EKG Interpretation None      MDM   Final diagnoses:  Abdominal pain    Patient feels somewhat better with IV fluids but he has remained tachycardic and is now developing a low-grade fever. Blood cultures will be obtained. Will obvious etiology but he does have a port. At this point given his comorbidities, continued tachycardia, and unclear etiology of his symptoms he will be admitted to the hospitalist. Dr. Sanjuana Letters requests IV Flagyl to cover for C. difficile and Levaquin for other intra-abdominal pathology. To be admitted to the hospital under telemetry.    Sherwood Gambler, MD 01/26/15 [phone]

## 2015-01-26 NOTE — ED Notes (Signed)
Pt is aware of need for urine specimen. Pt is sleeping at the moment.

## 2015-01-26 NOTE — ED Notes (Signed)
Bed: ML46 Expected date:  Expected time:  Means of arrival:  Comments: Triage 2

## 2015-01-26 NOTE — H&P (Addendum)
Patient Demographics  Alan Franco, is a 75 y.o. male  MRN: [id number]   DOB - [birth year]  Admit Date - 01/26/2015  Outpatient Primary MD for the patient is Alan Cower, MD   Assessment Alan Franco is a pleasant 75 y.o. 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 Patient took antibiotics in the last 2 months hence concern for C. Difficile colitis. However, he also has cough which could be related to healthcare associated/postobstructive pneumonia.  He is at increased risk for nosocomial/opportunistic organisms being immunocompromised. He has prior history of MRSA infection. His history is very limited today due to metabolic encephalopathy and this limits the overall assessment. Investigations today include CT abdomen and pelvis, which shows " No abnormality seen to explain diarrhea. No evidence of intrinsic bowel pathology. Left adrenal metastasis. Left acetabular metastasis which could result in pathologic fracture", CXR showing "Destructive lesion with soft tissue mass involving the left second rib with progression. There is a probable similar lesion of the right anterior third rib. This may be due to multiple myeloma or metastatic disease. Chest CT recommended for further evaluation. Negative for heart failure or pneumonia". Labs show wbc 7900, bun/cr 33/1.36, AST 52, UA unremarkable. Patient will be admitted to telemetry with enteric precautions as possibility of C. Difficile colitis. Investigations will include septic work up-Blood cultures/stool studies to include C-difficile pcr. He will be given IVF/empiric antibiotics to cover  nosocomial organisms(MRSA/Pseudomonas/C.difficile) pending septic work up results. He is full code. Plan Diarrhea of presumed infectious origin/Tachycardia/Fever/Sepsis (HCC)/Encephalopathy, metabolic/AKI (acute kidney injury) ([place])  Admit telemetry with enteric precautions  Follow blood culture results/C.difficile pcr  D5WNS with KCL/Vancomycin per pharmacy/Levaquin/Flagyl Lung cancer (HCC)/COPD (chronic obstructive pulmonary disease) ([place])  Prognosis very poor  Defer long term management to patient's primary care providers. Essential hypertension  Monitor BP and optimize control as necessary. DVT/GI Prophylaxis  Lovenox  PPI Family Communication: None at bedside. Will call wife in am.  Code Status   Full Code  Likely DC  Home  Condition GUARDED    Time spent in minutes : 81  Chief Complaint Chief Complaint  Patient presents with  . Diarrhea     [name]  is a 75 y.o. male, who presents with 2 day history of diarrhea and cough. There is no vomiting, no blood in stool, and no hemoptysis. Patient denies sick contacts. He does not give much more history as he is lethargic and says he feels tired.    Review of Systems   As in the HPI above.   Past Medical History  Diagnosis Date  . ALCOHOL ABUSE 08/24/2009  . CAROTID ARTERY DISEASE 08/30/2009  . CELLULITIS AND ABSCESS OF LEG EXCEPT FOOT 11/27/2009  . COPD 08/24/2009  . CORONARY ARTERY DISEASE 08/24/2009  . 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 GOUT 08/24/2009  . HEPATITIS C 08/24/2009  . HYPERLIPIDEMIA 08/24/2009  . HYPERTENSION 08/24/2009  . KNEE PAIN 02/14/2010  . MRSA 08/24/2009  . PERIPHERAL VASCULAR DISEASE 08/24/2009  . Preventative health care 08/09/2010  . RENAL INSUFFICIENCY 08/24/2009  . SICKLE CELL TRAIT 08/24/2009  . CKD (chronic kidney disease) 02/06/2011  . GERD (gastroesophageal reflux  disease) 02/06/2011  . BPH (benign prostatic hyperplasia) 02/06/2011  . Degenerative joint disease 08/10/2011       Past Surgical History  Procedure Laterality Date  . Knee surgury      arthoscopic on the right, Dr. French Ana  . Hand surgery Right     Social History Social History  Substance Use Topics  . Smoking status: Former Research scientist (life sciences)  . Smokeless tobacco: Never Used  . Alcohol Use: No    Family History Family History  Problem Relation Age of Onset  . Alcohol abuse Mother   . Alcohol abuse Father   . Alzheimer's disease Father   . Colon cancer Neg Hx   . Cancer Sister     Prior to Admission medications   Medication Sig Start Date End Date Taking? Authorizing Provider  allopurinol (ZYLOPRIM) 100 MG tablet Take 1 tablet (100 mg total) by mouth daily as needed. Gout 02/27/14 02/27/15 Yes Janith Lima, MD  feeding supplement, ENSURE ENLIVE, (ENSURE ENLIVE) LIQD Take 237 mLs by mouth 2 (two) times daily between meals. 12/09/14  Yes Florencia Reasons, MD  Ipratropium-Albuterol (COMBIVENT RESPIMAT) 20-100 MCG/ACT AERS respimat Inhale 1 puff into the lungs every 6 (six) hours as needed for wheezing. 11/02/12  Yes Biagio Borg, MD  fluticasone (FLOVENT HFA) 110 MCG/ACT inhaler Inhale 1 puff into the lungs 2 (two) times daily. Patient not taking: Reported on 12/08/2014 08/17/14 03/13/18  Biagio Borg, MD  indomethacin (INDOCIN) 50 MG capsule Take 1 capsule (50 mg total) by mouth 3 (three) times daily as needed. Patient not taking: Reported on 01/26/2015 04/25/14   Biagio Borg, MD  nystatin (MYCOSTATIN/NYSTOP) 100000 UNIT/GM POWD Use as directed to affected area up to three times per day Patient not taking: Reported on 08/06/2014 02/15/14   Biagio Borg, MD  omeprazole (PRILOSEC) 20 MG capsule Take 1 capsule (20 mg total) by mouth 2 (two) times daily. Patient not taking: Reported on 12/08/2014 02/06/11 03/02/15  Biagio Borg, MD  tamsulosin (FLOMAX) 0.4 MG CAPS capsule Take 1 capsule (0.4 mg total) by mouth daily. Patient not taking: Reported on 12/08/2014 12/14/13   Biagio Borg, MD    No Known Allergies  Physical  Exam  Vitals  Blood pressure 136/74, pulse 105, temperature 99.1 F (37.3 C), temperature source Oral, resp. rate 18, height '6\' 1"'  (1.854 m), weight 71.215 kg (157 lb), SpO2 100 %.   1. General: lying in bed. He is lethargic..  2. Normal affect and insight, Not Suicidal or Homicidal, Awake Alert, Oriented X 3.  3. No F.N deficits, ALL C.Nerves Intact, Strength 5/5 all 4 extremities, Sensation intact all 4 extremities, Plantars down going.  4. Ears and Eyes appear Normal, Conjunctivae clear, PERRLA. Dry Oral Mucosa.  5. Supple Neck, No JVD, No cervical lymphadenopathy appriciated, No Carotid Bruits.  6. Symmetrical Chest wall movement, Good air movement bilaterally, Transmitted sounds bilaterally.  7. Regular tachycardia, No Gallops, Rubs or Murmurs, No Parasternal Heave.  8. Positive Bowel Sounds, Abdomen Soft, Non tender, No organomegaly appriciated,No rebound -guarding or rigidity.  9.  No Cyanosis, Normal Skin Turgor, No Skin Rash or Bruise.  10. Good muscle tone,  joints appear normal , no effusions, Normal ROM.  11. No Palpable Lymph Nodes in Neck or Axillae  Data Review CBC  Recent Labs Lab 01/26/15 1410  WBC 7.9  HGB 9.9*  HCT 30.6*  PLT 345  MCV 82.0  MCH 26.5  MCHC 32.4  RDW 14.8  LYMPHSABS 1.5  MONOABS 0.5  EOSABS 0.0  BASOSABS 0.0   ------------------------------------------------------------------------------------------------------------------  Chemistries   Recent Labs Lab 01/26/15 1410  NA 140  K 4.3  CL 109  CO2 21*  GLUCOSE 94  BUN 33*  CREATININE 1.36*  CALCIUM 9.0  AST 52*  ALT 31  ALKPHOS 124  BILITOT 0.2*   ------------------------------------------------------------------------------------------------------------------ estimated creatinine clearance is 47.3 mL/min (by C-G formula based on Cr of 1.36). ------------------------------------------------------------------------------------------------------------------ No  results for input(s): TSH, T4TOTAL, T3FREE, THYROIDAB in the last 72 hours.  Invalid input(s): FREET3   Coagulation profile No results for input(s): INR, PROTIME in the last 168 hours. ------------------------------------------------------------------------------------------------------------------- No results for input(s): DDIMER in the last 72 hours. -------------------------------------------------------------------------------------------------------------------  Cardiac Enzymes No results for input(s): CKMB, TROPONINI, MYOGLOBIN in the last 168 hours.  Invalid input(s): CK ------------------------------------------------------------------------------------------------------------------ Invalid input(s): POCBNP   ---------------------------------------------------------------------------------------------------------------  Urinalysis    Component Value Date/Time   COLORURINE YELLOW 01/26/2015 1531   APPEARANCEUR CLEAR 01/26/2015 1531   LABSPEC 1.014 01/26/2015 1531   PHURINE 5.0 01/26/2015 1531   GLUCOSEU NEGATIVE 01/26/2015 1531   GLUCOSEU NEGATIVE 03/22/2014 1012   HGBUR TRACE* 01/26/2015 1531   BILIRUBINUR NEGATIVE 01/26/2015 1531   KETONESUR NEGATIVE 01/26/2015 1531   PROTEINUR NEGATIVE 01/26/2015 1531   UROBILINOGEN 0.2 03/22/2014 1012   NITRITE NEGATIVE 01/26/2015 1531   LEUKOCYTESUR NEGATIVE 01/26/2015 1531    ----------------------------------------------------------------------------------------------------------------  Imaging results  Ct Abdomen Pelvis Wo Contrast  01/26/2015  CLINICAL DATA:  Lung cancer patient with metastatic disease to the brain. Diarrhea beginning in the last couple of days. EXAM: CT ABDOMEN AND PELVIS WITHOUT CONTRAST TECHNIQUE: Multidetector CT imaging of the abdomen and pelvis was performed following the standard protocol without IV contrast. COMPARISON:  None. FINDINGS: The liver has a normal appearance without contrast. No calcified  gallstones. The spleen is normal. The pancreas is normal. The right adrenal gland is normal. There is a left adrenal mass measuring at least 4 cm in size highly likely to be a metastatic lesion. No renal lesion is seen. No stone or hydronephrosis. The aorta and its branch vessels show atherosclerosis but there is no aneurysm. The IVC is normal. No retroperitoneal mass or lymphadenopathy. No free fluid or air. The bladder appears normal. No evidence of bowel pathology to explain diarrhea. Lytic destructive mass of the left acetabulum with extension into the superior ramus. IMPRESSION: No abnormality seen to explain diarrhea. No evidence of intrinsic bowel pathology. Left adrenal metastasis. Left acetabular metastasis which could result in pathologic fracture. Electronically Signed   By: Nelson Chimes M.D.   On: 01/26/2015 15:05   Dg Chest 2 View  01/26/2015  CLINICAL DATA:  Cough.  Hypothermia.  COPD.  History of lung cancer. EXAM: CHEST  2 VIEW COMPARISON:  12/08/2014 FINDINGS: Heart size normal.  Negative for heart failure. Port-A-Cath tip in the SVC from right jugular approach. Destructive lesion of the left second rib with extrapleural soft tissue mass. This may show mild progression in the interval. Ill-defined density overlying the right upper lobe may be related to a mass lesion of the right anterior third rib. This may also have progressed. Negative for pneumonia or effusion. IMPRESSION: Destructive lesion with soft tissue mass involving the left second rib with progression. There is a probable similar lesion of the right anterior third rib. This may be due to multiple myeloma or metastatic disease. Chest CT recommended for further evaluation. Negative for heart failure or pneumonia. Electronically Signed   By: Franchot Gallo M.D.   On:  01/26/2015 13:12        Keeghan Mcintire M.D on 01/26/2015 at 8:47 PM  Between 7am to 7pm - Pager - [phone]  After 7pm go to [email or web address] - password [email or web address]  And  look for the night coverage person covering me after hours  [facility] Office  [phone]

## 2015-01-26 NOTE — ED Notes (Signed)
Pt presents with c/o diarrhea that started yesterday. Pt reports he is a stage 4 cancer patient, last chemo on Tuesday of this week. Pt has no other complaints at this time. Pt reports he did have an MRI done yesterday

## 2015-01-26 NOTE — Progress Notes (Signed)
ANTIBIOTIC CONSULT NOTE - INITIAL  Pharmacy Consult for Vancomycin Indication: Sepsis  No Known Allergies  Patient Measurements: Height: '6\' 1"'$  (185.4 cm) Weight: 157 lb (71.215 kg) IBW/kg (Calculated) : 79.9   Vital Signs: Temp: 99.1 F (37.3 C) (01/06 1831) Temp Source: Oral (01/06 1831) BP: 136/74 mmHg (01/06 1831) Pulse Rate: 105 (01/06 1831) Intake/Output from previous day:   Intake/Output from this shift:    Labs:  Recent Labs  01/26/15 1410  WBC 7.9  HGB 9.9*  PLT 345  CREATININE 1.36*   Estimated Creatinine Clearance: 47.3 mL/min (by C-G formula based on Cr of 1.36). No results for input(s): VANCOTROUGH, VANCOPEAK, VANCORANDOM, GENTTROUGH, GENTPEAK, GENTRANDOM, TOBRATROUGH, TOBRAPEAK, TOBRARND, AMIKACINPEAK, AMIKACINTROU, AMIKACIN in the last 72 hours.   Microbiology: No results found for this or any previous visit (from the past 720 hour(s)).  Medical History: Past Medical History  Diagnosis Date  . ALCOHOL ABUSE 08/24/2009  . CAROTID ARTERY DISEASE 08/30/2009  . CELLULITIS AND ABSCESS OF LEG EXCEPT FOOT 11/27/2009  . COPD 08/24/2009  . CORONARY ARTERY DISEASE 08/24/2009  . 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 GOUT 08/24/2009  . HEPATITIS C 08/24/2009  . HYPERLIPIDEMIA 08/24/2009  . HYPERTENSION 08/24/2009  . KNEE PAIN 02/14/2010  . MRSA 08/24/2009  . PERIPHERAL VASCULAR DISEASE 08/24/2009  . Preventative health care 08/09/2010  . RENAL INSUFFICIENCY 08/24/2009  . SICKLE CELL TRAIT 08/24/2009  . CKD (chronic kidney disease) 02/06/2011  . GERD (gastroesophageal reflux disease) 02/06/2011  . BPH (benign prostatic hyperplasia) 02/06/2011  . Degenerative joint disease 08/10/2011    Medications:  Prescriptions prior to admission  Medication Sig Dispense Refill Last Dose  . allopurinol (ZYLOPRIM) 100 MG tablet Take 1 tablet (100 mg total) by mouth daily as needed. Gout 30 tablet 0 unknown  . feeding supplement, ENSURE ENLIVE, (ENSURE ENLIVE) LIQD Take 237 mLs by mouth 2  (two) times daily between meals. 237 mL 12 Past Month at Unknown time  . Ipratropium-Albuterol (COMBIVENT RESPIMAT) 20-100 MCG/ACT AERS respimat Inhale 1 puff into the lungs every 6 (six) hours as needed for wheezing. 4 g 11 unknown  . fluticasone (FLOVENT HFA) 110 MCG/ACT inhaler Inhale 1 puff into the lungs 2 (two) times daily. (Patient not taking: Reported on 12/08/2014) 1 Inhaler 12 Not Taking at Unknown time  . indomethacin (INDOCIN) 50 MG capsule Take 1 capsule (50 mg total) by mouth 3 (three) times daily as needed. (Patient not taking: Reported on 01/26/2015) 90 capsule 3 Not Taking at Unknown time  . nystatin (MYCOSTATIN/NYSTOP) 100000 UNIT/GM POWD Use as directed to affected area up to three times per day (Patient not taking: Reported on 08/06/2014) 60 g 2 Not Taking at Unknown time  . omeprazole (PRILOSEC) 20 MG capsule Take 1 capsule (20 mg total) by mouth 2 (two) times daily. (Patient not taking: Reported on 12/08/2014) 180 capsule 3 Taking  . tamsulosin (FLOMAX) 0.4 MG CAPS capsule Take 1 capsule (0.4 mg total) by mouth daily. (Patient not taking: Reported on 12/08/2014) 90 capsule 3 Taking   Scheduled:  . enoxaparin (LOVENOX) injection  40 mg Subcutaneous Q24H  . famotidine (PEPCID) IV  20 mg Intravenous Q12H  . [START ON 01/27/2015] feeding supplement (ENSURE ENLIVE)  237 mL Oral BID BM  . levofloxacin (LEVAQUIN) IV  750 mg Intravenous Once  . [START ON 01/27/2015] levofloxacin (LEVAQUIN) IV  750 mg Intravenous Q24H  . [START ON 01/27/2015] metronidazole  500 mg Intravenous Q8H  . vancomycin  750 mg Intravenous Q12H   Infusions:  .  dextrose 5 % and 0.9 % NaCl with KCl 20 mEq/L     Assessment: 20 yoM with hx of Lung Ca admitted 1/6 with diarrhea.  Levaquin and Flagyl per MD.  Vancomycin per Rx for sepsis.   Goal of Therapy:  Vancomycin trough level 15-20 mcg/ml  Plan:   Vancomycin '750mg'$  IV q12h  F/u SCr/cultures/levels as needed  Lawana Pai R 01/26/2015,7:40 PM

## 2015-01-26 NOTE — ED Notes (Signed)
IV Team arrived to pt room as pt was leaving for radiology. IV team stated she would need to be re paged when the pt returns

## 2015-01-26 NOTE — ED Notes (Signed)
Delay in bolus hung; pt in CT.

## 2015-01-26 NOTE — ED Notes (Signed)
Unsuccessful attempt IV start/ blood draw

## 2015-01-27 LAB — COMPREHENSIVE METABOLIC PANEL
ALBUMIN: 2.6 g/dL — AB (ref 3.5–5.0)
ALT: 25 U/L (ref 17–63)
ANION GAP: 8 (ref 5–15)
AST: 45 U/L — ABNORMAL HIGH (ref 15–41)
Alkaline Phosphatase: 104 U/L (ref 38–126)
BILIRUBIN TOTAL: 0.3 mg/dL (ref 0.3–1.2)
BUN: 25 mg/dL — ABNORMAL HIGH (ref 6–20)
CO2: 21 mmol/L — ABNORMAL LOW (ref 22–32)
Calcium: 8.5 mg/dL — ABNORMAL LOW (ref 8.9–10.3)
Chloride: 110 mmol/L (ref 101–111)
Creatinine, Ser: 1.41 mg/dL — ABNORMAL HIGH (ref 0.61–1.24)
GFR calc Af Amer: 55 mL/min — ABNORMAL LOW (ref >60)
GFR calc non Af Amer: 47 mL/min — ABNORMAL LOW (ref >60)
GLUCOSE: 109 mg/dL — AB (ref 65–99)
POTASSIUM: 4.2 mmol/L (ref 3.5–5.1)
Sodium: 139 mmol/L (ref 135–145)
TOTAL PROTEIN: 7.2 g/dL (ref 6.5–8.1)

## 2015-01-27 LAB — GASTROINTESTINAL PANEL BY PCR, STOOL (REPLACES STOOL CULTURE)

## 2015-01-27 LAB — PROTIME-INR
INR: 1.23 (ref 0.00–1.49)
PROTHROMBIN TIME: 15.7 s — AB (ref 11.6–15.2)

## 2015-01-27 LAB — CBC
HCT: 27.8 % — ABNORMAL LOW (ref 39.0–52.0)
Hemoglobin: 9 g/dL — ABNORMAL LOW (ref 13.0–17.0)
MCH: 27 pg (ref 26.0–34.0)
MCHC: 32.4 g/dL (ref 30.0–36.0)
MCV: 83.5 fL (ref 78.0–100.0)
PLATELETS: 323 10*3/uL (ref 150–400)
RBC: 3.33 MIL/uL — AB (ref 4.22–5.81)
RDW: 15.1 % (ref 11.5–15.5)
WBC: 6.5 10*3/uL (ref 4.0–10.5)

## 2015-01-27 LAB — EXPECTORATED SPUTUM ASSESSMENT W REFEX TO RESP CULTURE

## 2015-01-27 LAB — C DIFFICILE QUICK SCREEN W PCR REFLEX
C DIFFICILE (CDIFF) INTERP: NEGATIVE
C DIFFICILE (CDIFF) TOXIN: NEGATIVE
C DIFFICLE (CDIFF) ANTIGEN: NEGATIVE

## 2015-01-27 LAB — PHOSPHORUS: PHOSPHORUS: 2.2 mg/dL — AB (ref 2.5–4.6)

## 2015-01-27 LAB — EXPECTORATED SPUTUM ASSESSMENT W GRAM STAIN, RFLX TO RESP C

## 2015-01-27 LAB — MAGNESIUM: Magnesium: 1.7 mg/dL (ref 1.7–2.4)

## 2015-01-27 LAB — TSH: TSH: 1.001 u[IU]/mL (ref 0.350–4.500)

## 2015-01-27 LAB — GLUCOSE, CAPILLARY: Glucose-Capillary: 97 mg/dL (ref 65–99)

## 2015-01-27 MED ORDER — LEVOFLOXACIN IN D5W 750 MG/150ML IV SOLN
750.0000 mg | INTRAVENOUS | Status: DC
  Administered 2015-01-28 – 2015-01-30 (×2): 750 mg via INTRAVENOUS
  Filled 2015-01-27 (×3): qty 150

## 2015-01-27 NOTE — Progress Notes (Signed)
[facility] PROGRESS NOTE  HARWOOD NALL ASN:[id number] DOB: Jan 05, 1941 DOA: 01/26/2015 PCP: Cathlean Cower, MD  Summary/Assessment/subjective events 01/26/15: Alan Franco is a pleasant 75 y.o. 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 Patient took antibiotics in the last 2 months hence concern for C. Difficile colitis. However, he also has cough which could be related to healthcare associated/postobstructive pneumonia. He is at increased risk for nosocomial/opportunistic organisms being immunocompromised. He has prior history of MRSA infection. His history is very limited today due to metabolic encephalopathy and this limits the overall assessment. Investigations today include CT abdomen and pelvis, which shows " No abnormality seen to explain diarrhea. No evidence of intrinsic bowel pathology. Left adrenal metastasis. Left acetabular metastasis which could result in pathologic fracture", CXR showing "Destructive lesion with soft tissue mass involving the left second rib with progression. There is a probable similar lesion of the right anterior third rib. This may be due to multiple myeloma or metastatic disease. Chest CT recommended for further evaluation. Negative for heart failure or pneumonia". Labs show wbc 7900, bun/cr 33/1.36, AST 52, UA unremarkable. Patient will be admitted to telemetry with enteric precautions as possibility of C. Difficile colitis. Investigations will include septic work up-Blood cultures/stool studies to include C-difficile pcr. He will be given IVF/empiric antibiotics to cover nosocomial  organisms(MRSA/Pseudomonas/C.difficile) pending septic work up results. He is full code. 01/27/15: Patient more with it today he has cough productive of greenish colored sputum. Await stool studies results. It appears that pneumonia may be the big picture. We'll therefore follow septic workup including respiratory culture, blood culture and stool studies and continue current antibiotics. Plan Diarrhea of presumed infectious origin/Tachycardia/Fever/Sepsis (HCC)/Encephalopathy, metabolic/AKI (acute kidney injury) ([place])  Continue enteric precautions  Follow blood culture results/C.difficile pcr/sputum culture  D5WNS with KCL  Day 2 Vancomycin per pharmacy/Levaquin/Flagyl Lung cancer (HCC)/COPD (chronic obstructive pulmonary disease) ([place])  Prognosis very poor  Defer long term management patient's primary care providers. Essential hypertension  Monitor BP and optimize control as necessary. DVT/GI Prophylaxis  Lovenox  PPI Family Communication: Spoke with patient's sister over the phone.  Code Status   Full Code  Likely DC  Home   Consultants:  None  Procedures:  None  Antibiotics:  Vancomycin 01/26/2015>  Levaquin 01/26/2015>  Flagyl 01/26/2015>  HPI/Subjective: Patient feels better. He is still tired.  Objective: Filed Vitals:   01/27/15 0845 01/27/15 1629  BP:  144/77  Pulse:  120  Temp: 98.1 F (36.7 C) 101.7 F (38.7 C)  Resp:  20    Intake/Output Summary (Last 24 hours) at 01/27/15 1802 Last data filed at 01/27/15 1500  Gross per 24 hour  Intake 2388.75 ml  Output   1200 ml  Net 1188.75 ml   Filed Weights   01/26/15 1120  Weight: 71.215 kg (157 lb)    Exam:   General:  Has productive cough. More with it.  Cardiovascular: S1-S2 normal. No murmurs. Pulse regular.  Respiratory: Good air entry bilaterally. No rhonchi or rales.  Abdomen: Soft and nontender. Normal bowel sounds. No organomegaly.  Musculoskeletal: No pedal edema    Neurological: Intact  Data Reviewed: Basic Metabolic Panel:  Recent Labs  Lab 01/26/15 1410 01/27/15 0555  NA 140 139  K 4.3 4.2  CL 109 110  CO2 21* 21*  GLUCOSE 94 109*  BUN 33* 25*  CREATININE 1.36* 1.41*  CALCIUM 9.0 8.5*  MG  --  1.7  PHOS  --  2.2*   Liver Function Tests:  Recent Labs Lab 01/26/15 1410 01/27/15 0555  AST 52* 45*  ALT 31 25  ALKPHOS 124 104  BILITOT 0.2* 0.3  PROT 8.2* 7.2  ALBUMIN 3.0* 2.6*    Recent Labs Lab 01/26/15 1410  LIPASE 20   No results for input(s): AMMONIA in the last 168 hours. CBC:  Recent Labs Lab 01/26/15 1410 01/27/15 0555  WBC 7.9 6.5  NEUTROABS 5.8  --   HGB 9.9* 9.0*  HCT 30.6* 27.8*  MCV 82.0 83.5  PLT 345 323   Cardiac Enzymes: No results for input(s): CKTOTAL, CKMB, CKMBINDEX, TROPONINI in the last 168 hours. BNP (last 3 results) No results for input(s): BNP in the last 8760 hours.  ProBNP (last 3 results) No results for input(s): PROBNP in the last 8760 hours.  CBG:  Recent Labs Lab 01/27/15 0804  GLUCAP 97    Recent Results (from the past 240 hour(s))  Blood culture (routine x 2)     Status: None (Preliminary result)   Collection Time: 01/26/15  2:05 PM  Result Value Ref Range Status   Specimen Description BLOOD RIGHT PORTA CATH  Final   Special Requests BOTTLES DRAWN AEROBIC AND ANAEROBIC 5 CC EA  Final   Culture   Final    NO GROWTH < 24 HOURS Performed at [facility]    Report Status PENDING  Incomplete  Blood culture (routine x 2)     Status: None (Preliminary result)   Collection Time: 01/26/15  5:00 PM  Result Value Ref Range Status   Specimen Description BLOOD LEFT HAND  Final   Special Requests BOTTLES DRAWN AEROBIC ONLY 5CC  Final   Culture   Final    NO GROWTH < 24 HOURS Performed at [facility]    Report Status PENDING  Incomplete     Studies: Ct Abdomen Pelvis Wo Contrast  01/26/2015  CLINICAL DATA:  Lung cancer patient with metastatic disease to  the brain. Diarrhea beginning in the last couple of days. EXAM: CT ABDOMEN AND PELVIS WITHOUT CONTRAST TECHNIQUE: Multidetector CT imaging of the abdomen and pelvis was performed following the standard protocol without IV contrast. COMPARISON:  None. FINDINGS: The liver has a normal appearance without contrast. No calcified gallstones. The spleen is normal. The pancreas is normal. The right adrenal gland is normal. There is a left adrenal mass measuring at least 4 cm in size highly likely to be a metastatic lesion. No renal lesion is seen. No stone or hydronephrosis. The aorta and its branch vessels show atherosclerosis but there is no aneurysm. The IVC is normal. No retroperitoneal mass or lymphadenopathy. No free fluid or air. The bladder appears normal. No evidence of bowel pathology to explain diarrhea. Lytic destructive mass of the left acetabulum with extension into the superior ramus. IMPRESSION: No abnormality seen to explain diarrhea. No evidence of intrinsic bowel pathology. Left adrenal metastasis. Left acetabular metastasis which could result in pathologic fracture. Electronically Signed   By: Nelson Chimes M.D.   On: 01/26/2015 15:05   Dg Chest 2 View  01/26/2015  CLINICAL DATA:  Cough.  Hypothermia.  COPD.  History of lung cancer. EXAM: CHEST  2 VIEW  COMPARISON:  12/08/2014 FINDINGS: Heart size normal.  Negative for heart failure. Port-A-Cath tip in the SVC from right jugular approach. Destructive lesion of the left second rib with extrapleural soft tissue mass. This may show mild progression in the interval. Ill-defined density overlying the right upper lobe may be related to a mass lesion of the right anterior third rib. This may also have progressed. Negative for pneumonia or effusion. IMPRESSION: Destructive lesion with soft tissue mass involving the left second rib with progression. There is a probable similar lesion of the right anterior third rib. This may be due to multiple myeloma or  metastatic disease. Chest CT recommended for further evaluation. Negative for heart failure or pneumonia. Electronically Signed   By: Franchot Gallo M.D.   On: 01/26/2015 13:12    Scheduled Meds: . enoxaparin (LOVENOX) injection  40 mg Subcutaneous Q24H  . famotidine (PEPCID) IV  20 mg Intravenous Q12H  . feeding supplement (ENSURE ENLIVE)  237 mL Oral BID BM  . [START ON 01/28/2015] levofloxacin (LEVAQUIN) IV  750 mg Intravenous Q48H  . metronidazole  500 mg Intravenous Q8H  . vancomycin  750 mg Intravenous Q12H   Continuous Infusions: . dextrose 5 % and 0.9 % NaCl with KCl 20 mEq/L 75 mL/hr at 01/27/15 1615     Time spent: 25 minutes    Lanitra Battaglini  [facility] Pager [phone]. If 7PM-7AM, please contact night-coverage at [email or web address], password [facility] 01/27/2015, 6:02 PM  LOS: 1 day

## 2015-01-28 DIAGNOSIS — R040 Epistaxis: Secondary | ICD-10-CM | POA: Diagnosis not present

## 2015-01-28 LAB — CBC
HEMATOCRIT: 28 % — AB (ref 39.0–52.0)
HEMOGLOBIN: 9 g/dL — AB (ref 13.0–17.0)
MCH: 26.8 pg (ref 26.0–34.0)
MCHC: 32.1 g/dL (ref 30.0–36.0)
MCV: 83.3 fL (ref 78.0–100.0)
Platelets: 321 10*3/uL (ref 150–400)
RBC: 3.36 MIL/uL — AB (ref 4.22–5.81)
RDW: 15 % (ref 11.5–15.5)
WBC: 5.4 10*3/uL (ref 4.0–10.5)

## 2015-01-28 LAB — BASIC METABOLIC PANEL
ANION GAP: 10 (ref 5–15)
BUN: 21 mg/dL — AB (ref 6–20)
CHLORIDE: 109 mmol/L (ref 101–111)
CO2: 20 mmol/L — ABNORMAL LOW (ref 22–32)
Calcium: 8.4 mg/dL — ABNORMAL LOW (ref 8.9–10.3)
Creatinine, Ser: 1.4 mg/dL — ABNORMAL HIGH (ref 0.61–1.24)
GFR calc Af Amer: 55 mL/min — ABNORMAL LOW (ref >60)
GFR calc non Af Amer: 48 mL/min — ABNORMAL LOW (ref >60)
GLUCOSE: 108 mg/dL — AB (ref 65–99)
POTASSIUM: 4.4 mmol/L (ref 3.5–5.1)
Sodium: 139 mmol/L (ref 135–145)

## 2015-01-28 LAB — MRSA PCR SCREENING: MRSA by PCR: NEGATIVE

## 2015-01-28 MED ORDER — SODIUM CHLORIDE 0.9 % IJ SOLN
10.0000 mL | INTRAMUSCULAR | Status: DC | PRN
  Administered 2015-01-30 – 2015-02-01 (×2): 10 mL
  Filled 2015-01-28 (×2): qty 40

## 2015-01-28 NOTE — Progress Notes (Signed)
[facility] PROGRESS NOTE  Alan Franco TDV:[id number] DOB: October 24, 1940 DOA: 01/26/2015 PCP: Cathlean Cower, MD  Summary/Assessment/subjective events 01/26/15: Alan Franco is a pleasant 75 y.o. 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 Patient took antibiotics in the last 2 months hence concern for C. Difficile colitis. However, he also has cough which could be related to healthcare associated/postobstructive pneumonia. He is at increased risk for nosocomial/opportunistic organisms being immunocompromised. He has prior history of MRSA infection. His history is very limited today due to metabolic encephalopathy and this limits the overall assessment. Investigations today include CT abdomen and pelvis, which shows " No abnormality seen to explain diarrhea. No evidence of intrinsic bowel pathology. Left adrenal metastasis. Left acetabular metastasis which could result in pathologic fracture", CXR showing "Destructive lesion with soft tissue mass involving the left second rib with progression. There is a probable similar lesion of the right anterior third rib. This may be due to multiple myeloma or metastatic disease. Chest CT recommended for further evaluation. Negative for heart failure or pneumonia". Labs show wbc 7900, bun/cr 33/1.36, AST 52, UA unremarkable. Patient will be admitted to telemetry with enteric precautions as possibility of C. Difficile colitis. Investigations will include septic work up-Blood cultures/stool studies to include C-difficile pcr. He will be given IVF/empiric antibiotics to cover nosocomial  organisms(MRSA/Pseudomonas/C.difficile) pending septic work up results. He is full code. 01/27/15: Patient more with it today he has cough productive of greenish colored sputum. Await stool studies results. It appears that pneumonia may be the big picture. We'll therefore follow septic workup including respiratory culture, blood culture and stool studies and continue current antibiotics. 01/28/15: Developed some Epistaxis, right nostril- D/c Lovenox. Low-grade fever last night. Septic workup remains negative. Continue current management Plan Diarrhea of presumed infectious origin/Tachycardia/Fever/Sepsis (HCC)/Encephalopathy, metabolic/AKI (acute kidney injury) ([place])  Continue enteric precautions  Follow blood culture results/C.difficile pcr/sputum culture  D5WNS with KCL  Day 3 Vancomycin per pharmacy/Levaquin/Flagyl Lung cancer (HCC)/COPD (chronic obstructive pulmonary disease) ([place])  Prognosis very poor  Defer long term management patient's primary care providers. Essential hypertension  Monitor BP and optimize control as necessary. DVT/GI Prophylaxis/epistaxis(self-limiting)  Discontinue Lovenox  SCDs  PPI Family Communication: Spoke with patient's sister over the phone.  Code Status   Full Code  Likely DC  Home   Consultants:  None  Procedures:  None  Antibiotics:  Vancomycin 01/26/2015>  Levaquin 01/26/2015>  Flagyl 01/26/2015>  HPI/Subjective: Feels somewhat better  Objective: Filed Vitals:   01/28/15 0540 01/28/15 1401  BP: 135/76 144/94  Pulse: 105 108  Temp: 100.1 F (37.8 C) 98.1 F (36.7 C)  Resp: 19 22    Intake/Output Summary (Last 24 hours) at 01/28/15 1555 Last data filed at 01/28/15 1300  Gross per 24 hour  Intake    850 ml  Output      0 ml  Net    850 ml   Filed Weights   01/26/15 1120  Weight: 71.215 kg (157 lb)    Exam:   General:  Comfortable at rest.  Cardiovascular: S1-S2 normal. No murmurs. Pulse  regular.  Respiratory: Good air entry bilaterally. No rhonchi or rales.  Abdomen: Soft and  nontender. Normal bowel sounds. No organomegaly.  Musculoskeletal: No pedal edema   Neurological: Intact  Data Reviewed: Basic Metabolic Panel:  Recent Labs Lab 01/26/15 1410 01/27/15 0555 01/28/15 0430  NA 140 139 139  K 4.3 4.2 4.4  CL 109 110 109  CO2 21* 21* 20*  GLUCOSE 94 109* 108*  BUN 33* 25* 21*  CREATININE 1.36* 1.41* 1.40*  CALCIUM 9.0 8.5* 8.4*  MG  --  1.7  --   PHOS  --  2.2*  --    Liver Function Tests:  Recent Labs Lab 01/26/15 1410 01/27/15 0555  AST 52* 45*  ALT 31 25  ALKPHOS 124 104  BILITOT 0.2* 0.3  PROT 8.2* 7.2  ALBUMIN 3.0* 2.6*    Recent Labs Lab 01/26/15 1410  LIPASE 20   No results for input(s): AMMONIA in the last 168 hours. CBC:  Recent Labs Lab 01/26/15 1410 01/27/15 0555 01/28/15 0430  WBC 7.9 6.5 5.4  NEUTROABS 5.8  --   --   HGB 9.9* 9.0* 9.0*  HCT 30.6* 27.8* 28.0*  MCV 82.0 83.5 83.3  PLT 345 323 321   Cardiac Enzymes: No results for input(s): CKTOTAL, CKMB, CKMBINDEX, TROPONINI in the last 168 hours. BNP (last 3 results) No results for input(s): BNP in the last 8760 hours.  ProBNP (last 3 results) No results for input(s): PROBNP in the last 8760 hours.  CBG:  Recent Labs Lab 01/27/15 0804  GLUCAP 97    Recent Results (from the past 240 hour(s))  Blood culture (routine x 2)     Status: None (Preliminary result)   Collection Time: 01/26/15  2:05 PM  Result Value Ref Range Status   Specimen Description BLOOD RIGHT PORTA CATH  Final   Special Requests BOTTLES DRAWN AEROBIC AND ANAEROBIC 5 CC EA  Final   Culture   Final    NO GROWTH 2 DAYS Performed at [facility]    Report Status PENDING  Incomplete  Blood culture (routine x 2)     Status: None (Preliminary result)   Collection Time: 01/26/15  5:00 PM  Result Value Ref Range Status   Specimen Description BLOOD LEFT HAND  Final   Special  Requests BOTTLES DRAWN AEROBIC ONLY 5CC  Final   Culture   Final    NO GROWTH 2 DAYS Performed at [facility]    Report Status PENDING  Incomplete  Gastrointestinal Panel by PCR , Stool     Status: None   Collection Time: 01/26/15  9:34 PM  Result Value Ref Range Status   Campylobacter species NOT DETECTED NOT DETECTED Final   Plesimonas shigelloides NOT DETECTED NOT DETECTED Final   Salmonella species NOT DETECTED NOT DETECTED Final   Yersinia enterocolitica NOT DETECTED NOT DETECTED Final   Vibrio species NOT DETECTED NOT DETECTED Final   Vibrio cholerae NOT DETECTED NOT DETECTED Final   Enteroaggregative E coli (EAEC) NOT DETECTED NOT DETECTED Final   Enteropathogenic E coli (EPEC) NOT DETECTED NOT DETECTED Final   Enterotoxigenic E coli (ETEC) NOT DETECTED NOT DETECTED Final   Shiga like toxin producing E coli (STEC) NOT DETECTED NOT DETECTED Final   E. coli O157 NOT DETECTED NOT DETECTED Final   Shigella/Enteroinvasive E coli (EIEC) NOT DETECTED NOT DETECTED Final   Cryptosporidium NOT DETECTED NOT DETECTED Final   Cyclospora cayetanensis NOT DETECTED NOT DETECTED Final   Entamoeba histolytica NOT DETECTED NOT DETECTED Final   Giardia lamblia NOT DETECTED NOT DETECTED Final  Adenovirus F40/41 NOT DETECTED NOT DETECTED Final   Astrovirus NOT DETECTED NOT DETECTED Final   Norovirus GI/GII NOT DETECTED NOT DETECTED Final   Rotavirus A NOT DETECTED NOT DETECTED Final   Sapovirus (I, II, IV, and V) NOT DETECTED NOT DETECTED Final  Culture, expectorated sputum-assessment     Status: None   Collection Time: 01/27/15  7:31 PM  Result Value Ref Range Status   Specimen Description SPUTUM  Final   Special Requests Immunocompromised  Final   Sputum evaluation THIS SPECIMEN IS ACCEPTABLE FOR SPUTUM CULTURE  Final   Report Status 01/27/2015 FINAL  Final  C difficile quick scan w PCR reflex     Status: None   Collection Time: 01/27/15  7:35 PM  Result Value Ref Range Status   C  Diff antigen NEGATIVE NEGATIVE Final   C Diff toxin NEGATIVE NEGATIVE Final   C Diff interpretation Negative for toxigenic C. difficile  Final     Studies: No results found.  Scheduled Meds: . famotidine (PEPCID) IV  20 mg Intravenous Q12H  . feeding supplement (ENSURE ENLIVE)  237 mL Oral BID BM  . levofloxacin (LEVAQUIN) IV  750 mg Intravenous Q48H  . metronidazole  500 mg Intravenous Q8H  . vancomycin  750 mg Intravenous Q12H   Continuous Infusions: . dextrose 5 % and 0.9 % NaCl with KCl 20 mEq/L 75 mL/hr at 01/28/15 0541     Time spent: 25 minutes    Alan Franco  [facility] Pager [phone]. If 7PM-7AM, please contact night-coverage at [email or web address], password [facility] 01/28/2015, 3:55 PM  LOS: 2 days

## 2015-01-29 DIAGNOSIS — E43 Unspecified severe protein-calorie malnutrition: Secondary | ICD-10-CM | POA: Diagnosis present

## 2015-01-29 LAB — CBC WITH DIFFERENTIAL/PLATELET
BASOS PCT: 0 %
Basophils Absolute: 0 10*3/uL (ref 0.0–0.1)
EOS ABS: 0 10*3/uL (ref 0.0–0.7)
EOS PCT: 1 %
HCT: 26 % — ABNORMAL LOW (ref 39.0–52.0)
Hemoglobin: 8.4 g/dL — ABNORMAL LOW (ref 13.0–17.0)
LYMPHS ABS: 1.5 10*3/uL (ref 0.7–4.0)
LYMPHS PCT: 34 %
MCH: 26.8 pg (ref 26.0–34.0)
MCHC: 32.3 g/dL (ref 30.0–36.0)
MCV: 83.1 fL (ref 78.0–100.0)
MONO ABS: 0.6 10*3/uL (ref 0.1–1.0)
Monocytes Relative: 13 %
NEUTROS ABS: 2.3 10*3/uL (ref 1.7–7.7)
NEUTROS PCT: 52 %
PLATELETS: 301 10*3/uL (ref 150–400)
RBC: 3.13 MIL/uL — AB (ref 4.22–5.81)
RDW: 15.1 % (ref 11.5–15.5)
WBC: 4.5 10*3/uL (ref 4.0–10.5)

## 2015-01-29 LAB — COMPREHENSIVE METABOLIC PANEL
ALBUMIN: 2.3 g/dL — AB (ref 3.5–5.0)
ALT: 24 U/L (ref 17–63)
ANION GAP: 6 (ref 5–15)
AST: 45 U/L — ABNORMAL HIGH (ref 15–41)
Alkaline Phosphatase: 84 U/L (ref 38–126)
BUN: 20 mg/dL (ref 6–20)
CHLORIDE: 109 mmol/L (ref 101–111)
CO2: 21 mmol/L — AB (ref 22–32)
Calcium: 8.4 mg/dL — ABNORMAL LOW (ref 8.9–10.3)
Creatinine, Ser: 1.82 mg/dL — ABNORMAL HIGH (ref 0.61–1.24)
GFR calc non Af Amer: 35 mL/min — ABNORMAL LOW (ref >60)
GFR, EST AFRICAN AMERICAN: 40 mL/min — AB (ref >60)
GLUCOSE: 102 mg/dL — AB (ref 65–99)
Potassium: 4 mmol/L (ref 3.5–5.1)
SODIUM: 136 mmol/L (ref 135–145)
Total Bilirubin: 0.4 mg/dL (ref 0.3–1.2)
Total Protein: 6.5 g/dL (ref 6.5–8.1)

## 2015-01-29 LAB — PROTIME-INR
INR: 1.18 (ref 0.00–1.49)
Prothrombin Time: 15.2 seconds (ref 11.6–15.2)

## 2015-01-29 MED ORDER — BOOST / RESOURCE BREEZE PO LIQD
1.0000 | Freq: Three times a day (TID) | ORAL | Status: DC
  Administered 2015-01-29 – 2015-02-01 (×8): 1 via ORAL

## 2015-01-29 NOTE — Progress Notes (Signed)
Pharmacy Antibiotic Follow-up Note  Alan Franco is a 75 y.o. year-old male admitted on 01/26/2015.  The patient is currently on day 4 of Vancomycin, Levaquin, and metronidazole for sepsis d/t HCAP or possible Cdiff colitis.  Assessment/Plan:  Continue Levaquin 750 mg IV q48h  Continue Vancomycin 750 mg IV q12h.  Measure Vanc trough at steady state - before 2000 dose on 1/9.  Follow up renal fxn, culture results, and clinical course.  Recommend narrowing antibiotics.  Please d/c metronidazole for negative CDiff.  Please d/c vancomycin with negative MRSA PCR.    Temp (24hrs), Avg:98.7 F (37.1 C), Min:98.1 F (36.7 C), Max:99.3 F (37.4 C)   Recent Labs Lab 01/26/15 1410 01/27/15 0555 01/28/15 0430 01/29/15 0432  WBC 7.9 6.5 5.4 4.5    Recent Labs Lab 01/26/15 1410 01/27/15 0555 01/28/15 0430 01/29/15 0432  CREATININE 1.36* 1.41* 1.40* 1.82*   Estimated Creatinine Clearance: 35.3 mL/min (by C-G formula based on Cr of 1.82).    No Known Allergies  Antimicrobials this admission: 1/6 >> levaquin >> 1/6 >> flagyl >>  1/6 >> vancomycin >>  Levels/dose changes this admission:  1/9 Vanc trough @ 1900 = _____ on 750 mg q12h.  Microbiology results: 1/6 blood: ngtd 1/6 C diff: negative toxin and negative antigen 1/6 GI panel: neg 1/7 sputum: IP 1/8 MRSA PCR: negative  Thank you for allowing pharmacy to be a part of this patient's care.  Gretta Arab PharmD, BCPS Pager [phone] 01/29/2015 10:08 AM

## 2015-01-29 NOTE — Progress Notes (Signed)
[facility] PROGRESS NOTE  Alan Franco ZMO:[id number] DOB: 07/04/1940 DOA: 01/26/2015 PCP: Cathlean Cower, MD  Summary/Assessment/subjective events 01/26/15: Alan Franco is a pleasant 75 y.o. 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 Patient took antibiotics in the last 2 months hence concern for C. Difficile colitis. However, he also has cough which could be related to healthcare associated/postobstructive pneumonia. He is at increased risk for nosocomial/opportunistic organisms being immunocompromised. He has prior history of MRSA infection. His history is very limited today due to metabolic encephalopathy and this limits the overall assessment. Investigations today include CT abdomen and pelvis, which shows " No abnormality seen to explain diarrhea. No evidence of intrinsic bowel pathology. Left adrenal metastasis. Left acetabular metastasis which could result in pathologic fracture", CXR showing "Destructive lesion with soft tissue mass involving the left second rib with progression. There is a probable similar lesion of the right anterior third rib. This may be due to multiple myeloma or metastatic disease. Chest CT recommended for further evaluation. Negative for heart failure or pneumonia". Labs show wbc 7900, bun/cr 33/1.36, AST 52, UA unremarkable. Patient will be admitted to telemetry with enteric precautions as possibility of C. Difficile colitis. Investigations will include septic work up-Blood cultures/stool studies to include C-difficile pcr. He will be given IVF/empiric antibiotics to cover nosocomial  organisms(MRSA/Pseudomonas/C.difficile) pending septic work up results. He is full code. 01/27/15: Patient more with it today he has cough productive of greenish colored sputum. Await stool studies results. It appears that pneumonia may be the big picture. We'll therefore follow septic workup including respiratory culture, blood culture and stool studies and continue current antibiotics. 01/28/15: Developed some Epistaxis, right nostril- D/c Lovenox. Low-grade fever last night. Septic workup remains negative. Continue current management. 01/29/15: Epistaxis improved. Cough better. Low grade fever. Stool C diff pcr negative. Cultures remain negative. Will d/c Vancomycin and continue current management. Plan Diarrhea of presumed infectious origin/Tachycardia/Fever/Sepsis (HCC)/Encephalopathy, metabolic/AKI (acute kidney injury) ([place])  D/c Vancomycin  Continue Levaquin/Flagyl Lung cancer (HCC)/COPD (chronic obstructive pulmonary disease) (HCC)/Severe protein calorie malnutrition  Prognosis very poor  Defer long term management patient's primary care providers. Essential hypertension  Monitor BP and optimize control as necessary. DVT/GI Prophylaxis/epistaxis(self-limiting)  SCDs  PPI Family Communication: Spoke with patient's sister over the phone.  Code Status   Full Code  Likely DC  Home   Consultants:  None  Procedures:  None  Antibiotics:  Vancomycin 01/26/2015>01/29/15  Levaquin 01/26/2015>  Flagyl 01/26/2015>  HPI/Subjective: Feels weak.  Objective: Filed Vitals:   01/29/15 1307 01/29/15 2033  BP: 129/71 127/70  Pulse: 101 109  Temp: 98.4 F (36.9 C) 97.9 F (36.6 C)  Resp: 18 18    Intake/Output Summary (Last 24 hours) at 01/29/15 2348 Last data filed at 01/29/15 2309  Gross per 24 hour  Intake    600 ml  Output    350 ml  Net    250 ml   Filed Weights   01/26/15 1120  Weight: 71.215 kg (157 lb)    Exam:   General:  Comfortable at  rest.  Cardiovascular: S1-S2 normal. No murmurs. Pulse regular.  Respiratory: Good air entry bilaterally. No rhonchi or  rales.  Abdomen: Soft and nontender. Normal bowel sounds. No organomegaly.  Musculoskeletal: No pedal edema   Neurological: Intact  Data Reviewed: Basic Metabolic Panel:  Recent Labs Lab 01/26/15 1410 01/27/15 0555 01/28/15 0430 01/29/15 0432  NA 140 139 139 136  K 4.3 4.2 4.4 4.0  CL 109 110 109 109  CO2 21* 21* 20* 21*  GLUCOSE 94 109* 108* 102*  BUN 33* 25* 21* 20  CREATININE 1.36* 1.41* 1.40* 1.82*  CALCIUM 9.0 8.5* 8.4* 8.4*  MG  --  1.7  --   --   PHOS  --  2.2*  --   --    Liver Function Tests:  Recent Labs Lab 01/26/15 1410 01/27/15 0555 01/29/15 0432  AST 52* 45* 45*  ALT '31 25 24  ' ALKPHOS 124 104 84  BILITOT 0.2* 0.3 0.4  PROT 8.2* 7.2 6.5  ALBUMIN 3.0* 2.6* 2.3*    Recent Labs Lab 01/26/15 1410  LIPASE 20   No results for input(s): AMMONIA in the last 168 hours. CBC:  Recent Labs Lab 01/26/15 1410 01/27/15 0555 01/28/15 0430 01/29/15 0432  WBC 7.9 6.5 5.4 4.5  NEUTROABS 5.8  --   --  2.3  HGB 9.9* 9.0* 9.0* 8.4*  HCT 30.6* 27.8* 28.0* 26.0*  MCV 82.0 83.5 83.3 83.1  PLT 345 323 321 301   Cardiac Enzymes: No results for input(s): CKTOTAL, CKMB, CKMBINDEX, TROPONINI in the last 168 hours. BNP (last 3 results) No results for input(s): BNP in the last 8760 hours.  ProBNP (last 3 results) No results for input(s): PROBNP in the last 8760 hours.  CBG:  Recent Labs Lab 01/27/15 0804  GLUCAP 97    Recent Results (from the past 240 hour(s))  Blood culture (routine x 2)     Status: None (Preliminary result)   Collection Time: 01/26/15  2:05 PM  Result Value Ref Range Status   Specimen Description BLOOD RIGHT PORTA CATH  Final   Special Requests BOTTLES DRAWN AEROBIC AND ANAEROBIC 5 CC EA  Final   Culture   Final    NO GROWTH 3 DAYS Performed at [facility]    Report Status PENDING  Incomplete   Blood culture (routine x 2)     Status: None (Preliminary result)   Collection Time: 01/26/15  5:00 PM  Result Value Ref Range Status   Specimen Description BLOOD LEFT HAND  Final   Special Requests BOTTLES DRAWN AEROBIC ONLY 5CC  Final   Culture   Final    NO GROWTH 3 DAYS Performed at [facility]    Report Status PENDING  Incomplete  Gastrointestinal Panel by PCR , Stool     Status: None   Collection Time: 01/26/15  9:34 PM  Result Value Ref Range Status   Campylobacter species NOT DETECTED NOT DETECTED Final   Plesimonas shigelloides NOT DETECTED NOT DETECTED Final   Salmonella species NOT DETECTED NOT DETECTED Final   Yersinia enterocolitica NOT DETECTED NOT DETECTED Final   Vibrio species NOT DETECTED NOT DETECTED Final   Vibrio cholerae NOT DETECTED NOT DETECTED Final   Enteroaggregative E coli (EAEC) NOT DETECTED NOT DETECTED Final   Enteropathogenic E coli (EPEC) NOT DETECTED NOT DETECTED Final   Enterotoxigenic E coli (ETEC) NOT DETECTED NOT DETECTED Final   Shiga like toxin producing E coli (STEC) NOT DETECTED NOT DETECTED Final   E. coli O157 NOT DETECTED NOT DETECTED Final   Shigella/Enteroinvasive E coli (EIEC) NOT DETECTED NOT DETECTED Final  Cryptosporidium NOT DETECTED NOT DETECTED Final   Cyclospora cayetanensis NOT DETECTED NOT DETECTED Final   Entamoeba histolytica NOT DETECTED NOT DETECTED Final   Giardia lamblia NOT DETECTED NOT DETECTED Final   Adenovirus F40/41 NOT DETECTED NOT DETECTED Final   Astrovirus NOT DETECTED NOT DETECTED Final   Norovirus GI/GII NOT DETECTED NOT DETECTED Final   Rotavirus A NOT DETECTED NOT DETECTED Final   Sapovirus (I, II, IV, and V) NOT DETECTED NOT DETECTED Final  Culture, expectorated sputum-assessment     Status: None   Collection Time: 01/27/15  7:31 PM  Result Value Ref Range Status   Specimen Description SPUTUM  Final   Special Requests Immunocompromised  Final   Sputum evaluation THIS SPECIMEN IS ACCEPTABLE  FOR SPUTUM CULTURE  Final   Report Status 01/27/2015 FINAL  Final  Culture, respiratory (NON-Expectorated)     Status: None (Preliminary result)   Collection Time: 01/27/15  7:31 PM  Result Value Ref Range Status   Specimen Description SPUTUM  Final   Special Requests NONE  Final   Gram Stain   Final    MODERATE WBC PRESENT,BOTH PMN AND MONONUCLEAR FEW SQUAMOUS EPITHELIAL CELLS PRESENT FEW GRAM POSITIVE COCCI IN CLUSTERS Performed at [facility]    Culture   Final    Culture reincubated for better growth Performed at [facility]    Report Status PENDING  Incomplete  C difficile quick scan w PCR reflex     Status: None   Collection Time: 01/27/15  7:35 PM  Result Value Ref Range Status   C Diff antigen NEGATIVE NEGATIVE Final   C Diff toxin NEGATIVE NEGATIVE Final   C Diff interpretation Negative for toxigenic C. difficile  Final  MRSA PCR Screening     Status: None   Collection Time: 01/28/15  3:14 PM  Result Value Ref Range Status   MRSA by PCR NEGATIVE NEGATIVE Final    Comment:        The GeneXpert MRSA Assay (FDA approved for NASAL specimens only), is one component of a comprehensive MRSA colonization surveillance program. It is not intended to diagnose MRSA infection nor to guide or monitor treatment for MRSA infections.      Studies: No results found.  Scheduled Meds: . famotidine (PEPCID) IV  20 mg Intravenous Q12H  . feeding supplement  1 Container Oral TID BM  . levofloxacin (LEVAQUIN) IV  750 mg Intravenous Q48H  . metronidazole  500 mg Intravenous Q8H   Continuous Infusions:    Time spent: 25 minutes    Dhriti Fales  [facility] Pager [phone]. If 7PM-7AM, please contact night-coverage at [email or web address], password [facility] 01/29/2015, 11:48 PM  LOS: 3 days

## 2015-01-29 NOTE — Progress Notes (Signed)
Initial Nutrition Assessment  DOCUMENTATION CODES:   Severe malnutrition in context of chronic illness  INTERVENTION:  - Will d/c Ensure Enlive - Will order Boost Breeze TID, each supplement provides 250 kcal and 9 grams of protein - Encourage PO intakes with meals and supplements - RD will continue to monitor for needs  NUTRITION DIAGNOSIS:   Increased nutrient needs related to catabolic illness, cancer and cancer related treatments as evidenced by estimated needs.  GOAL:   Patient will meet greater than or equal to 90% of their needs  MONITOR:   PO intake, Supplement acceptance, Weight trends, Labs, I & O's  REASON FOR ASSESSMENT:   Malnutrition Screening Tool  ASSESSMENT:   75 y.o. 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 Patient took antibiotics in the last 2 months hence concern for C. Difficile colitis. However, he also has cough which could be related to healthcare associated/postobstructive pneumonia. He is at increased risk for nosocomial/opportunistic organisms being immunocompromised. He has prior history of MRSA infection. His history is very limited today due to metabolic encephalopathy and this limits the overall assessment. Investigations today include CT abdomen and pelvis, which shows " No abnormality seen to explain diarrhea. No evidence of intrinsic bowel pathology. Left adrenal metastasis. Left acetabular metastasis which could result in pathologic fracture", CXR showing "Destructive lesion with soft tissue mass involving the left second rib with progression. There is a probable similar lesion of the right  anterior third rib. This may be due to multiple myeloma or metastatic disease. Chest CT recommended for further evaluation. Negative for heart failure or pneumonia".  Pt seen for MST. BMI indicates normal weight status. Per chart review, pt ate 25% of breakfast and lunch yesterday (1/8). Visualized lunch tray with 50% of grilled cheese sandwich and 100% of glass of cranberry juice consumed. Pt states that he has had a poor appetite that has worsened over the past 2 weeks and that intakes of foods and liquids make diarrhea worse. He states that diarrhea began when he stopped receiving chemotherapy. He is unsure if he was experiencing taste alterations while receiving chemo.   Pt states that PTA he was being followed by the [place] for cancer and that he was provided with Ensure. He did not consume any of this supplement PTA but is concerned that it would worsen his diarrhea as he is lactose-intolerant. Informed pt that Ensure is lactose-free but that some people do experience GI discomfort with the supplement. As pt states he tolerated cranberry juice without issue talked with him about Boost Breeze and benefits of added protein. Pt is willing to try this supplement.   Pt requests physical assessment not be done at this time. He states that he has been losing weight over the past few months but is unsure of the amount of weight he lost. Per chart review, pt has lost 30 lbs (16% body weight) in 6-6.5 months which is significant for time frame.   Not meeting needs. Medications reviewed. Labs reviewed; creatinine elevated and trending up, Ca: 8.4 mg/dL, GFR: 40.   Diet Order:  Diet regular Room service appropriate?: Yes with Assist; Fluid consistency:: Thin  Skin:  Reviewed,  no issues  Last BM:  1/9  Height:   Ht Readings from Last 1 Encounters:  01/26/15 '6\' 1"'  (1.854 m)    Weight:   Wt Readings from Last 1 Encounters:  01/26/15 157 lb (71.215 kg)    Ideal Body Weight:  83.64 kg (kg)  BMI:   Body mass index is 20.72 kg/(m^2).  Estimated Nutritional Needs:   Kcal:  2250-2480  Protein:  85-100 grams  Fluid:  2.1-2.3 L/day  EDUCATION NEEDS:   No education needs identified at this time     Jarome Matin, RD, LDN Inpatient Clinical Dietitian Pager # [phone] After hours/weekend pager # [phone]

## 2015-01-30 LAB — CBC
HEMATOCRIT: 25.5 % — AB (ref 39.0–52.0)
Hemoglobin: 8.2 g/dL — ABNORMAL LOW (ref 13.0–17.0)
MCH: 26.8 pg (ref 26.0–34.0)
MCHC: 32.2 g/dL (ref 30.0–36.0)
MCV: 83.3 fL (ref 78.0–100.0)
Platelets: 322 10*3/uL (ref 150–400)
RBC: 3.06 MIL/uL — ABNORMAL LOW (ref 4.22–5.81)
RDW: 15.2 % (ref 11.5–15.5)
WBC: 4.8 10*3/uL (ref 4.0–10.5)

## 2015-01-30 LAB — CULTURE, RESPIRATORY W GRAM STAIN: Culture: NORMAL

## 2015-01-30 LAB — BASIC METABOLIC PANEL
Anion gap: 7 (ref 5–15)
BUN: 20 mg/dL (ref 6–20)
CO2: 20 mmol/L — ABNORMAL LOW (ref 22–32)
Calcium: 8.3 mg/dL — ABNORMAL LOW (ref 8.9–10.3)
Chloride: 110 mmol/L (ref 101–111)
Creatinine, Ser: 1.89 mg/dL — ABNORMAL HIGH (ref 0.61–1.24)
GFR calc Af Amer: 38 mL/min — ABNORMAL LOW (ref >60)
GFR calc non Af Amer: 33 mL/min — ABNORMAL LOW (ref >60)
Glucose, Bld: 106 mg/dL — ABNORMAL HIGH (ref 65–99)
Potassium: 3.5 mmol/L (ref 3.5–5.1)
Sodium: 137 mmol/L (ref 135–145)

## 2015-01-30 LAB — GLUCOSE, CAPILLARY: Glucose-Capillary: 107 mg/dL — ABNORMAL HIGH (ref 65–99)

## 2015-01-30 NOTE — Progress Notes (Signed)
[facility] PROGRESS NOTE  Alan Franco OQH:[id number] DOB: 03/04/1940 DOA: 01/26/2015 PCP: Alan Cower, MD  Summary/Assessment/subjective events 01/26/15: Alan Franco is a pleasant 75 y.o. 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 Patient took antibiotics in the last 2 months hence concern for C. Difficile colitis. However, he also has cough which could be related to healthcare associated/postobstructive pneumonia. He is at increased risk for nosocomial/opportunistic organisms being immunocompromised. He has prior history of MRSA infection. His history is very limited today due to metabolic encephalopathy and this limits the overall assessment. Investigations today include CT abdomen and pelvis, which shows " No abnormality seen to explain diarrhea. No evidence of intrinsic bowel pathology. Left adrenal metastasis. Left acetabular metastasis which could result in pathologic fracture", CXR showing "Destructive lesion with soft tissue mass involving the left second rib with progression. There is a probable similar lesion of the right anterior third rib. This may be due to multiple myeloma or metastatic disease. Chest CT recommended for further evaluation. Negative for heart failure or pneumonia". Labs show wbc 7900, bun/cr 33/1.36, AST 52, UA unremarkable. Patient will be admitted to telemetry with enteric precautions as possibility of C. Difficile colitis. Investigations will include septic work up-Blood cultures/stool studies to include C-difficile pcr. He will be given IVF/empiric antibiotics to cover nosocomial  organisms(MRSA/Pseudomonas/C.difficile) pending septic work up results. He is full code. 01/27/15: Patient more with it today he has cough productive of greenish colored sputum. Await stool studies results. It appears that pneumonia may be the big picture. We'll therefore follow septic workup including respiratory culture, blood culture and stool studies and continue current antibiotics. 01/28/15: Developed some Epistaxis, right nostril- D/c Lovenox. Low-grade fever last night. Septic workup remains negative. Continue current management. 01/29/15: Epistaxis improved. Cough better. Low grade fever. Stool C diff pcr negative. Cultures remain negative. Will d/c Vancomycin and continue current management. 01/30/15: Remains generally lethargic but fever has resolved. Cough is also improved. Wonder if he had a viral illness which has improved. Septic workup is negative so far. Would continue Levaquin/Flagyl to complete 7 day course of antibiotics. Patient is to follow with oncology at the [facility] upon discharge. Plan Diarrhea of presumed infectious origin/Tachycardia/Fever/Sepsis (HCC)/Encephalopathy, metabolic/AKI (acute kidney injury) (Alan Franco)  Continue Levaquin/Flagyl to complete 7 day course Lung cancer (HCC)/COPD (chronic obstructive pulmonary disease) (HCC)/Severe protein calorie malnutrition  Prognosis very poor  Defer long term management to patient's primary care providers. Essential hypertension  Monitor BP and optimize control as necessary. DVT/GI Prophylaxis/epistaxis(self-limiting)  SCDs  PPI Family Communication: Spoke with patient's sister over the phone.  Code Status   Full Code  Likely DC  Home   Consultants:  None  Procedures:  None  Antibiotics:  Vancomycin 01/26/2015>01/29/15  Levaquin 01/26/2015>  Flagyl 01/26/2015>  HPI/Subjective: Denies specific complaints.  Objective: Filed Vitals:   01/30/15 1349 01/30/15 2103  BP: 118/59 106/57  Pulse:  114  Temp: 98.1 F  (36.7 C) 99.8 F (37.7 C)  Resp: 18 18    Intake/Output Summary (Last 24 hours) at 01/30/15 2130 Last data filed at 01/30/15 2105  Gross per 24 hour  Intake   1030 ml  Output  1075 ml  Net    -45 ml   Filed Weights   01/26/15 1120  Weight: 71.215 kg (157 lb)    Exam:   General:  Apathetic.  Cardiovascular: S1-S2 normal. No murmurs. Pulse regular.  Respiratory: Good air entry bilaterally. No rhonchi or rales.  Abdomen: Soft and nontender. Normal bowel sounds. No organomegaly.  Musculoskeletal: No pedal edema   Neurological: Intact  Data Reviewed: Basic Metabolic Panel:  Recent Labs Lab 01/26/15 1410 01/27/15 0555 01/28/15 0430 01/29/15 0432 01/30/15 0354  NA 140 139 139 136 137  K 4.3 4.2 4.4 4.0 3.5  CL 109 110 109 109 110  CO2 21* 21* 20* 21* 20*  GLUCOSE 94 109* 108* 102* 106*  BUN 33* 25* 21* 20 20  CREATININE 1.36* 1.41* 1.40* 1.82* 1.89*  CALCIUM 9.0 8.5* 8.4* 8.4* 8.3*  MG  --  1.7  --   --   --   PHOS  --  2.2*  --   --   --    Liver Function Tests:  Recent Labs Lab 01/26/15 1410 01/27/15 0555 01/29/15 0432  AST 52* 45* 45*  ALT '31 25 24  ' ALKPHOS 124 104 84  BILITOT 0.2* 0.3 0.4  PROT 8.2* 7.2 6.5  ALBUMIN 3.0* 2.6* 2.3*    Recent Labs Lab 01/26/15 1410  LIPASE 20   No results for input(s): AMMONIA in the last 168 hours. CBC:  Recent Labs Lab 01/26/15 1410 01/27/15 0555 01/28/15 0430 01/29/15 0432 01/30/15 0354  WBC 7.9 6.5 5.4 4.5 4.8  NEUTROABS 5.8  --   --  2.3  --   HGB 9.9* 9.0* 9.0* 8.4* 8.2*  HCT 30.6* 27.8* 28.0* 26.0* 25.5*  MCV 82.0 83.5 83.3 83.1 83.3  PLT 345 323 321 301 322   Cardiac Enzymes: No results for input(s): CKTOTAL, CKMB, CKMBINDEX, TROPONINI in the last 168 hours. BNP (last 3 results) No results for input(s): BNP in the last 8760 hours.  ProBNP (last 3 results) No results for input(s): PROBNP in the last 8760 hours.  CBG:  Recent Labs Lab 01/27/15 0804 01/30/15 1157  GLUCAP 97 107*     Recent Results (from the past 240 hour(s))  Blood culture (routine x 2)     Status: None (Preliminary result)   Collection Time: 01/26/15  2:05 PM  Result Value Ref Range Status   Specimen Description BLOOD RIGHT PORTA CATH  Final   Special Requests BOTTLES DRAWN AEROBIC AND ANAEROBIC 5 CC EA  Final   Culture   Final    NO GROWTH 4 DAYS Performed at [facility]    Report Status PENDING  Incomplete  Blood culture (routine x 2)     Status: None (Preliminary result)   Collection Time: 01/26/15  5:00 PM  Result Value Ref Range Status   Specimen Description BLOOD LEFT HAND  Final   Special Requests BOTTLES DRAWN AEROBIC ONLY 5CC  Final   Culture   Final    NO GROWTH 4 DAYS Performed at [facility]    Report Status PENDING  Incomplete  Gastrointestinal Panel by PCR , Stool     Status: None   Collection Time: 01/26/15  9:34 PM  Result Value Ref Range Status   Campylobacter species NOT DETECTED NOT DETECTED Final   Plesimonas shigelloides NOT DETECTED NOT DETECTED Final   Salmonella species NOT DETECTED NOT DETECTED Final   Yersinia enterocolitica NOT DETECTED NOT DETECTED Final   Vibrio species NOT DETECTED NOT DETECTED  Final   Vibrio cholerae NOT DETECTED NOT DETECTED Final   Enteroaggregative E coli (EAEC) NOT DETECTED NOT DETECTED Final   Enteropathogenic E coli (EPEC) NOT DETECTED NOT DETECTED Final   Enterotoxigenic E coli (ETEC) NOT DETECTED NOT DETECTED Final   Shiga like toxin producing E coli (STEC) NOT DETECTED NOT DETECTED Final   E. coli O157 NOT DETECTED NOT DETECTED Final   Shigella/Enteroinvasive E coli (EIEC) NOT DETECTED NOT DETECTED Final   Cryptosporidium NOT DETECTED NOT DETECTED Final   Cyclospora cayetanensis NOT DETECTED NOT DETECTED Final   Entamoeba histolytica NOT DETECTED NOT DETECTED Final   Giardia lamblia NOT DETECTED NOT DETECTED Final   Adenovirus F40/41 NOT DETECTED NOT DETECTED Final   Astrovirus NOT DETECTED NOT DETECTED Final    Norovirus GI/GII NOT DETECTED NOT DETECTED Final   Rotavirus A NOT DETECTED NOT DETECTED Final   Sapovirus (I, II, IV, and V) NOT DETECTED NOT DETECTED Final  Culture, expectorated sputum-assessment     Status: None   Collection Time: 01/27/15  7:31 PM  Result Value Ref Range Status   Specimen Description SPUTUM  Final   Special Requests Immunocompromised  Final   Sputum evaluation THIS SPECIMEN IS ACCEPTABLE FOR SPUTUM CULTURE  Final   Report Status 01/27/2015 FINAL  Final  Culture, respiratory (NON-Expectorated)     Status: None   Collection Time: 01/27/15  7:31 PM  Result Value Ref Range Status   Specimen Description SPUTUM  Final   Special Requests NONE  Final   Gram Stain   Final    MODERATE WBC PRESENT,BOTH PMN AND MONONUCLEAR FEW SQUAMOUS EPITHELIAL CELLS PRESENT FEW GRAM POSITIVE COCCI IN CLUSTERS Performed at [facility]    Culture   Final    NORMAL OROPHARYNGEAL FLORA Performed at [facility]    Report Status 01/30/2015 FINAL  Final  C difficile quick scan w PCR reflex     Status: None   Collection Time: 01/27/15  7:35 PM  Result Value Ref Range Status   C Diff antigen NEGATIVE NEGATIVE Final   C Diff toxin NEGATIVE NEGATIVE Final   C Diff interpretation Negative for toxigenic C. difficile  Final  MRSA PCR Screening     Status: None   Collection Time: 01/28/15  3:14 PM  Result Value Ref Range Status   MRSA by PCR NEGATIVE NEGATIVE Final    Comment:        The GeneXpert MRSA Assay (FDA approved for NASAL specimens only), is one component of a comprehensive MRSA colonization surveillance program. It is not intended to diagnose MRSA infection nor to guide or monitor treatment for MRSA infections.      Studies: No results found.  Scheduled Meds: . famotidine (PEPCID) IV  20 mg Intravenous Q12H  . feeding supplement  1 Container Oral TID BM  . levofloxacin (LEVAQUIN) IV  750 mg Intravenous Q48H  . metronidazole  500 mg Intravenous Q8H    Continuous Infusions:    Time spent: 25 minutes    Odin Mariani  [facility] Pager [phone]. If 7PM-7AM, please contact night-coverage at [email or web address], password [facility] 01/30/2015, 9:30 PM  LOS: 4 days

## 2015-01-30 NOTE — Care Management Important Message (Signed)
Important Message  Patient Details  Name: Alan Franco MRN: [id number] Date of Birth: 14-Jul-1940   Medicare Important Message Given:  Yes    [name] 01/30/2015, 11:04 AMImportant Message  Patient Details  Name: Alan Franco MRN: [id number] Date of Birth: 09/10/1940   Medicare Important Message Given:  Yes    [name] 01/30/2015, 11:04 AM

## 2015-01-31 DIAGNOSIS — E43 Unspecified severe protein-calorie malnutrition: Secondary | ICD-10-CM

## 2015-01-31 DIAGNOSIS — J449 Chronic obstructive pulmonary disease, unspecified: Secondary | ICD-10-CM

## 2015-01-31 DIAGNOSIS — N179 Acute kidney failure, unspecified: Secondary | ICD-10-CM

## 2015-01-31 DIAGNOSIS — A09 Infectious gastroenteritis and colitis, unspecified: Secondary | ICD-10-CM

## 2015-01-31 DIAGNOSIS — A419 Sepsis, unspecified organism: Principal | ICD-10-CM

## 2015-01-31 DIAGNOSIS — C3490 Malignant neoplasm of unspecified part of unspecified bronchus or lung: Secondary | ICD-10-CM

## 2015-01-31 DIAGNOSIS — G9341 Metabolic encephalopathy: Secondary | ICD-10-CM

## 2015-01-31 LAB — COMPREHENSIVE METABOLIC PANEL
ALBUMIN: 2.3 g/dL — AB (ref 3.5–5.0)
ALK PHOS: 81 U/L (ref 38–126)
ALT: 24 U/L (ref 17–63)
ANION GAP: 8 (ref 5–15)
AST: 52 U/L — AB (ref 15–41)
BILIRUBIN TOTAL: 0.3 mg/dL (ref 0.3–1.2)
BUN: 19 mg/dL (ref 6–20)
CALCIUM: 8.4 mg/dL — AB (ref 8.9–10.3)
CO2: 20 mmol/L — ABNORMAL LOW (ref 22–32)
Chloride: 111 mmol/L (ref 101–111)
Creatinine, Ser: 1.72 mg/dL — ABNORMAL HIGH (ref 0.61–1.24)
GFR calc non Af Amer: 37 mL/min — ABNORMAL LOW (ref >60)
GFR, EST AFRICAN AMERICAN: 43 mL/min — AB (ref >60)
GLUCOSE: 98 mg/dL (ref 65–99)
POTASSIUM: 3.5 mmol/L (ref 3.5–5.1)
Sodium: 139 mmol/L (ref 135–145)
TOTAL PROTEIN: 6.6 g/dL (ref 6.5–8.1)

## 2015-01-31 LAB — CULTURE, BLOOD (ROUTINE X 2)
CULTURE: NO GROWTH
Culture: NO GROWTH

## 2015-01-31 LAB — CBC
HEMATOCRIT: 25.8 % — AB (ref 39.0–52.0)
HEMOGLOBIN: 8.4 g/dL — AB (ref 13.0–17.0)
MCH: 26.3 pg (ref 26.0–34.0)
MCHC: 32.6 g/dL (ref 30.0–36.0)
MCV: 80.9 fL (ref 78.0–100.0)
Platelets: 323 10*3/uL (ref 150–400)
RBC: 3.19 MIL/uL — ABNORMAL LOW (ref 4.22–5.81)
RDW: 15 % (ref 11.5–15.5)
WBC: 7.4 10*3/uL (ref 4.0–10.5)

## 2015-01-31 NOTE — Progress Notes (Signed)
PROGRESS NOTE  Alan Franco DXA:[id number] DOB: 1941-01-04 DOA: 01/26/2015 PCP: Cathlean Cower, MD  HPI: 75 y.o. 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   Subjective / 24 H Interval events - no complaints this morning, AxOx4  Assessment/Plan: Principal Problem:   Diarrhea of presumed infectious origin Active Problems:   Essential hypertension   COPD (chronic obstructive pulmonary disease) (HCC)   Tachycardia   Fever   Sepsis (HCC)   Encephalopathy, metabolic   Lung cancer (HCC)   AKI (acute kidney injury) ([place])   Mild epistaxis   Protein-calorie malnutrition, severe  Diarrhea of presumed infectious origin - resolved, GI panel negative, C diff negative - continue Metronidazole  Acute Kidney Injury on CKD III - overall stable renal function, Cr at baseline  Sepsis due to possible CAP - with intermittent fevers, now afebrile - respiratory symptoms with productive cough suggesting bronchitis vs CAP  - continue Levaquin  Acute encephalopathy  - resolved - ? Contributing brain mets. Patient tells [place] that he had a repeat MRI with new lesions which are now under observation  Lung cancer (HCC)/COPD (chronic obstructive pulmonary disease) (HCC)/Severe protein calorie malnutrition - Prognosis very poor - Defer long term management to patient's primary care providers.  Essential hypertension - Monitor BP and optimize control as necessary.  Epistaxis(self-limiting)   Diet: Diet regular Room service appropriate?: Yes with Assist; Fluid consistency:: Thin Fluids: none  DVT Prophylaxis: SCD  Code Status: Full Code Family Communication: no family bedside  Disposition Plan: home when ready   Barriers to discharge: IV antibiotics  Consultants:  None   Procedures:  None     Antibiotics Vancomycin 01/26/2015>01/29/15 Levaquin 01/26/2015> Flagyl 01/26/2015>   Studies  No results found.  Objective  Filed Vitals:   01/30/15 1349 01/30/15 2103 01/31/15 0648 01/31/15 1411  BP: 118/59 106/57 118/67 125/72  Pulse:  114 97 106  Temp: 98.1 F (36.7 C) 99.8 F (37.7 C) 98.2 F (36.8 C) 97 F (36.1 C)  TempSrc: Oral Oral Oral Oral  Resp: '18 18 18 20  '$ Height:      Weight:      SpO2: 98% 96% 99% 100%    Intake/Output Summary (Last 24 hours) at 01/31/15 1433 Last data filed at 01/31/15 0900  Gross per 24 hour  Intake    697 ml  Output    850 ml  Net   -153 ml   Filed Weights   01/26/15 1120  Weight: 71.215 kg (157 lb)   Exam:  GENERAL: NAD  HEENT: no scleral icterus, PERRL  NECK: supple, no LAD  LUNGS: CTA biL, no wheezing  HEART: RRR without MRG  ABDOMEN: soft, non tender  EXTREMITIES: no clubbing / cyanosis  NEUROLOGIC: non focal  Data Reviewed: Basic Metabolic Panel:  Recent Labs Lab 01/27/15 0555 01/28/15 0430 01/29/15 0432 01/30/15 0354 01/31/15 0503  NA 139 139 136 137 139  K 4.2 4.4 4.0 3.5 3.5  CL 110 109 109 110 111  CO2 21* 20* 21* 20* 20*  GLUCOSE 109* 108* 102* 106* 98  BUN 25* 21* '20 20 19  '$ CREATININE 1.41* 1.40* 1.82* 1.89* 1.72*  CALCIUM 8.5* 8.4* 8.4* 8.3* 8.4*  MG 1.7  --   --   --   --   PHOS 2.2*  --   --   --   --  Liver Function Tests:  Recent Labs Lab 01/26/15 1410 01/27/15 0555 01/29/15 0432 01/31/15 0503  AST 52* 45* 45* 52*  ALT '31 25 24 24  '$ ALKPHOS 124 104 84 81  BILITOT 0.2* 0.3 0.4 0.3  PROT 8.2* 7.2 6.5 6.6  ALBUMIN 3.0* 2.6* 2.3* 2.3*    Recent Labs Lab 01/26/15 1410  LIPASE 20   CBC:  Recent Labs Lab 01/26/15 1410 01/27/15 0555 01/28/15 0430 01/29/15 0432 01/30/15 0354 01/31/15 0503  WBC 7.9 6.5 5.4 4.5 4.8 7.4  NEUTROABS 5.8  --   --  2.3  --   --   HGB 9.9* 9.0* 9.0* 8.4* 8.2* 8.4*  HCT 30.6* 27.8* 28.0* 26.0* 25.5* 25.8*  MCV 82.0 83.5 83.3 83.1 83.3  80.9  PLT 345 323 321 301 322 323   CBG:  Recent Labs Lab 01/27/15 0804 01/30/15 1157  GLUCAP 97 107*    Recent Results (from the past 240 hour(s))  Blood culture (routine x 2)     Status: None   Collection Time: 01/26/15  2:05 PM  Result Value Ref Range Status   Specimen Description BLOOD RIGHT PORTA CATH  Final   Special Requests BOTTLES DRAWN AEROBIC AND ANAEROBIC 5 CC EA  Final   Culture   Final    NO GROWTH 5 DAYS Performed at [facility]    Report Status 01/31/2015 FINAL  Final  Blood culture (routine x 2)     Status: None   Collection Time: 01/26/15  5:00 PM  Result Value Ref Range Status   Specimen Description BLOOD LEFT HAND  Final   Special Requests BOTTLES DRAWN AEROBIC ONLY 5CC  Final   Culture   Final    NO GROWTH 5 DAYS Performed at [facility]    Report Status 01/31/2015 FINAL  Final  Gastrointestinal Panel by PCR , Stool     Status: None   Collection Time: 01/26/15  9:34 PM  Result Value Ref Range Status   Campylobacter species NOT DETECTED NOT DETECTED Final   Plesimonas shigelloides NOT DETECTED NOT DETECTED Final   Salmonella species NOT DETECTED NOT DETECTED Final   Yersinia enterocolitica NOT DETECTED NOT DETECTED Final   Vibrio species NOT DETECTED NOT DETECTED Final   Vibrio cholerae NOT DETECTED NOT DETECTED Final   Enteroaggregative E coli (EAEC) NOT DETECTED NOT DETECTED Final   Enteropathogenic E coli (EPEC) NOT DETECTED NOT DETECTED Final   Enterotoxigenic E coli (ETEC) NOT DETECTED NOT DETECTED Final   Shiga like toxin producing E coli (STEC) NOT DETECTED NOT DETECTED Final   E. coli O157 NOT DETECTED NOT DETECTED Final   Shigella/Enteroinvasive E coli (EIEC) NOT DETECTED NOT DETECTED Final   Cryptosporidium NOT DETECTED NOT DETECTED Final   Cyclospora cayetanensis NOT DETECTED NOT DETECTED Final   Entamoeba histolytica NOT DETECTED NOT DETECTED Final   Giardia lamblia NOT DETECTED NOT DETECTED Final   Adenovirus F40/41  NOT DETECTED NOT DETECTED Final   Astrovirus NOT DETECTED NOT DETECTED Final   Norovirus GI/GII NOT DETECTED NOT DETECTED Final   Rotavirus A NOT DETECTED NOT DETECTED Final   Sapovirus (I, II, IV, and V) NOT DETECTED NOT DETECTED Final  Culture, expectorated sputum-assessment     Status: None   Collection Time: 01/27/15  7:31 PM  Result Value Ref Range Status   Specimen Description SPUTUM  Final   Special Requests Immunocompromised  Final   Sputum evaluation THIS SPECIMEN IS ACCEPTABLE FOR SPUTUM CULTURE  Final   Report  Status 01/27/2015 FINAL  Final  Culture, respiratory (NON-Expectorated)     Status: None   Collection Time: 01/27/15  7:31 PM  Result Value Ref Range Status   Specimen Description SPUTUM  Final   Special Requests NONE  Final   Gram Stain   Final    MODERATE WBC PRESENT,BOTH PMN AND MONONUCLEAR FEW SQUAMOUS EPITHELIAL CELLS PRESENT FEW GRAM POSITIVE COCCI IN CLUSTERS Performed at [facility]    Culture   Final    NORMAL OROPHARYNGEAL FLORA Performed at [facility]    Report Status 01/30/2015 FINAL  Final  C difficile quick scan w PCR reflex     Status: None   Collection Time: 01/27/15  7:35 PM  Result Value Ref Range Status   C Diff antigen NEGATIVE NEGATIVE Final   C Diff toxin NEGATIVE NEGATIVE Final   C Diff interpretation Negative for toxigenic C. difficile  Final  MRSA PCR Screening     Status: None   Collection Time: 01/28/15  3:14 PM  Result Value Ref Range Status   MRSA by PCR NEGATIVE NEGATIVE Final    Comment:        The GeneXpert MRSA Assay (FDA approved for NASAL specimens only), is one component of a comprehensive MRSA colonization surveillance program. It is not intended to diagnose MRSA infection nor to guide or monitor treatment for MRSA infections.      Scheduled Meds: . famotidine (PEPCID) IV  20 mg Intravenous Q12H  . feeding supplement  1 Container Oral TID BM  . levofloxacin (LEVAQUIN) IV  750 mg Intravenous  Q48H  . metronidazole  500 mg Intravenous Q8H   Continuous Infusions:   Marzetta Board, MD [facility] Pager [phone]. If 7 PM - 7 AM, please contact night-coverage at [email or web address], password [facility] 01/31/2015, 2:33 PM  LOS: 5 days

## 2015-01-31 NOTE — Progress Notes (Signed)
Offered pt aromatherapy for relaxation and pain relief. At pt's request, 2 drops lavender eo placed on 2x2 gauze in paper med cup @ pt's bedside for inhalation aromatherapy. Pt states his pain is about 5/10 but that what's bothering him is the arguments he's been having with his wife regarding his plans to "go to the cancer center in [place]" after discharge. Spoke with patient at length, offering emotional support.  Lind Guest, RN

## 2015-01-31 NOTE — Evaluation (Signed)
Physical Therapy Evaluation Patient Details Name: Alan Franco MRN: [id number] DOB: March 21, 1940 Today's Date: 01/31/2015   History of Present Illness  75 yo male admitted with diarrhea of infectious origin, acute encephalopathy. hx of CAD, COPD, stage IV lung cancer with brain mets, L acetabular mets, L 2nd rib mass.   Clinical Impression  On eval, pt was supervision level for mobility-walked ~10 feet with RW. Trial with RW so pt could compare RW vs quad cane with respect to being able to limit WBing on L LE due to mets to acetabulum. [facility]. Pt reported to therapist that he would like to begin exercise program, especially for UEs, to maintain his strength. For this reason, recommend HHPT follow for implementation of supervised HEP program if MD/oncologist approve.     Follow Up Recommendations Home health PT (pt would like to get started on supervised HEP if MD/oncologist is ok with it)    Equipment Recommendations  Rolling walker with 5" wheels    Recommendations for Other Services       Precautions / Restrictions Precautions Precautions: Fall Restrictions Weight Bearing Restrictions: No      Mobility  Bed Mobility Overal bed mobility: Modified Independent                Transfers Overall transfer level: Needs assistance Equipment used: Rolling walker (2 wheeled) Transfers: Sit to/from Stand Sit to Stand: Supervision         General transfer comment: VCs hand placement  Ambulation/Gait Ambulation/Gait assistance: Supervision Ambulation Distance (Feet): 10 Feet Assistive device: Rolling walker (2 wheeled) Gait Pattern/deviations: Step-to pattern     General Gait Details: VCs for proper use of RW, step to technique to limit WBing on L LE.   Stairs            Wheelchair Mobility    Modified Rankin (Stroke Patients Only)       Balance                                              Pertinent Vitals/Pain Pain Score: 5  Pain Location: L hip/LE Pain Descriptors / Indicators: Aching;Sore Pain Intervention(s): Monitored during session;Repositioned    Home Living Family/patient expects to be discharged to:: Private residence Living Arrangements: Spouse/significant other   Type of Home: House Home Access: Stairs to enter   Technical brewer of Steps: 3 Home Layout: One level Home Equipment: Wheelchair - manual;Cane - quad Additional Comments: motorized chair and ramp ordered by [facility]    Prior Function Level of Independence: Needs assistance   Gait / Transfers Assistance Needed: ambulatory for short distances.      Comments: [facility]     Hand Dominance        Extremity/Trunk Assessment   Upper Extremity Assessment: Overall WFL for tasks assessed           Lower Extremity Assessment: LLE deficits/detail   LLE Deficits / Details: Pt is unable to lift leg against gravity but he is able to weightbear  Cervical / Trunk Assessment: Normal  Communication   Communication: No difficulties  Cognition Arousal/Alertness: Awake/alert Behavior During Therapy: WFL for tasks assessed/performed Overall Cognitive Status: Within Functional Limits for tasks assessed  General Comments      Exercises        Assessment/Plan    PT Assessment Patient needs continued PT services  PT Diagnosis Difficulty walking;Acute pain   PT Problem List Decreased strength;Decreased balance;Decreased activity tolerance;Decreased mobility;Pain  PT Treatment Interventions DME instruction;Gait training;Functional mobility training;Therapeutic activities;Patient/family education;Balance training;Therapeutic exercise   PT Goals (Current goals can be found in the Care Plan section) Acute Rehab PT Goals Patient Stated Goal: to remain as strong and mobile as possible PT Goal Formulation: With patient Time For Goal Achievement:  02/14/15 Potential to Achieve Goals: Fair    Frequency Min 3X/week   Barriers to discharge        Co-evaluation               End of Session   Activity Tolerance: Patient tolerated treatment well Patient left: in bed;with call bell/phone within reach           Time: 1433-1509 PT Time Calculation (min) (ACUTE ONLY): 36 min   Charges:   PT Evaluation $PT Eval Moderate Complexity: 1 Procedure PT Treatments $Gait Training: 8-22 mins   PT G Codes:        Weston Anna, MPT Pager: [phone]

## 2015-02-01 MED ORDER — HEPARIN SOD (PORK) LOCK FLUSH 100 UNIT/ML IV SOLN
500.0000 [IU] | INTRAVENOUS | Status: DC
  Filled 2015-02-01: qty 5

## 2015-02-01 MED ORDER — HEPARIN SOD (PORK) LOCK FLUSH 100 UNIT/ML IV SOLN
500.0000 [IU] | INTRAVENOUS | Status: DC | PRN
  Administered 2015-02-01: 500 [IU]
  Filled 2015-02-01 (×2): qty 5

## 2015-02-01 NOTE — Progress Notes (Signed)
Pt had no preference for [facility]. [facility] in house rep was given referral.

## 2015-02-01 NOTE — Discharge Instructions (Signed)
Follow with Cathlean Cower, MD in 5-7 days  Please get a complete blood count and chemistry panel checked by your Primary MD at your next visit, and again as instructed by your Primary MD. Please get your medications reviewed and adjusted by your Primary MD.  Please request your Primary MD to go over all Hospital Tests and Procedure/Radiological results at the follow up, please get all Hospital records sent to your Prim MD by signing hospital release before you go home.  If you had Pneumonia of Lung problems at the Hospital: Please get a 2 view Chest X ray done in 6-8 weeks after hospital discharge or sooner if instructed by your Primary MD.  If you have Congestive Heart Failure: Please call your Cardiologist or Primary MD anytime you have any of the following symptoms:  1) 3 pound weight gain in 24 hours or 5 pounds in 1 week  2) shortness of breath, with or without a dry hacking cough  3) swelling in the hands, feet or stomach  4) if you have to sleep on extra pillows at night in order to breathe  Follow cardiac low salt diet and 1.5 lit/day fluid restriction.  If you have diabetes Accuchecks 4 times/day, Once in AM empty stomach and then before each meal. Log in all results and show them to your primary doctor at your next visit. If any glucose reading is under 80 or above 300 call your primary MD immediately.  If you have Seizure/Convulsions/Epilepsy: Please do not drive, operate heavy machinery, participate in activities at heights or participate in high speed sports until you have seen by Primary MD or a Neurologist and advised to do so again.  If you had Gastrointestinal Bleeding: Please ask your Primary MD to check a complete blood count within one week of discharge or at your next visit. Your endoscopic/colonoscopic biopsies that are pending at the time of discharge, will also need to followed by your Primary MD.  Get Medicines reviewed and adjusted. Please take all your  medications with you for your next visit with your Primary MD  Please request your Primary MD to go over all hospital tests and procedure/radiological results at the follow up, please ask your Primary MD to get all Hospital records sent to his/her office.  If you experience worsening of your admission symptoms, develop shortness of breath, life threatening emergency, suicidal or homicidal thoughts you must seek medical attention immediately by calling 911 or calling your MD immediately  if symptoms less severe.  You must read complete instructions/literature along with all the possible adverse reactions/side effects for all the Medicines you take and that have been prescribed to you. Take any new Medicines after you have completely understood and accpet all the possible adverse reactions/side effects.   Do not drive or operate heavy machinery when taking Pain medications.   Do not take more than prescribed Pain, Sleep and Anxiety Medications  Special Instructions: If you have smoked or chewed Tobacco  in the last 2 yrs please stop smoking, stop any regular Alcohol  and or any Recreational drug use.  Wear Seat belts while driving.  Please note You were cared for by a hospitalist during your hospital stay. If you have any questions about your discharge medications or the care you received while you were in the hospital after you are discharged, you can call the unit and asked to speak with the hospitalist on call if the hospitalist that took care of you is not available. Once  you are discharged, your primary care physician will handle any further medical issues. Please note that NO REFILLS for any discharge medications will be authorized once you are discharged, as it is imperative that you return to your primary care physician (or establish a relationship with a primary care physician if you do not have one) for your aftercare needs so that they can reassess your need for medications and monitor your  lab values.  You can reach the hospitalist office at phone [phone] or fax [phone]   If you do not have a primary care physician, you can call [phone] for a physician referral.  Activity: As tolerated with Full fall precautions use walker/cane & assistance as needed  Diet: regular  Disposition Home

## 2015-02-01 NOTE — Discharge Summary (Signed)
Physician Discharge Summary  Alan Franco YIR:[id number] DOB: 1940/04/06 DOA: 01/26/2015  PCP: Cathlean Cower, MD  Admit date: 01/26/2015 Discharge date: 02/01/2015  Time spent: > 30 minutes  Recommendations for Outpatient Follow-up:  1. Follow up with [place] PCP in 1-2 weeks 2. Follow up with [place] Oncologist in 1-2 weeks   Discharge Diagnoses:  Principal Problem:   Diarrhea of presumed infectious origin Active Problems:   Essential hypertension   COPD (chronic obstructive pulmonary disease) (HCC)   Tachycardia   Fever   Sepsis ([place])   Encephalopathy, metabolic   Lung cancer (HCC)   AKI (acute kidney injury) ([place])   Mild epistaxis   Protein-calorie malnutrition, severe  Discharge Condition: stable  Diet recommendation: regular  Filed Weights   01/26/15 1120  Weight: 71.215 kg (157 lb)    History of present illness:  See H&P, Labs, Consult and Test reports for all details in brief, patient is a 75 y.o. 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   Hospital Course:  Diarrhea of presumed infectious origin - resolved, GI panel negative, C diff negative, patient was maintained on metronidazole whole hospitalized and completed a 7 day course. Resolved. Acute Kidney Injury on CKD III - overall stable renal function, Cr at baseline Sepsis due to possible CAP - with intermittent fevers, now afebrile, completed 7 days of Levaquin, respiratory status improved, stable on room air.  Acute encephalopathy - resolved, ? Contributing brain mets. Patient tells [place] that he had a repeat MRI with new lesions which are now under observation. Outpatient follow up with his oncologist. Lung cancer (HCC)/COPD (chronic obstructive pulmonary disease) (HCC)/Severe protein calorie malnutrition - Prognosis very poor, Defer long term  management to patient's primary oncologist Essential hypertension - Monitor BP and optimize control as necessary. Epistaxis(self-limiting)   Procedures:  None    Consultations:  None   Discharge Exam: Filed Vitals:   01/31/15 0648 01/31/15 1411 01/31/15 2046 02/01/15 0504  BP: 118/67 125/72 120/63 117/65  Pulse: 97 106 99 94  Temp: 98.2 F (36.8 C) 97 F (36.1 C) 98 F (36.7 C) 97.7 F (36.5 C)  TempSrc: Oral Oral Oral Oral  Resp: '18 20 18 16  ' Height:      Weight:      SpO2: 99% 100% 98% 98%   General: NAD Cardiovascular: RRR Respiratory: CTA biL  Discharge Instructions Activity:  As tolerated   Get Medicines reviewed and adjusted: Please take all your medications with you for your next visit with your Primary MD  Please request your Primary MD to go over all hospital tests and procedure/radiological results at the follow up, please ask your Primary MD to get all Hospital records sent to his/her office.  If you experience worsening of your admission symptoms, develop shortness of breath, life threatening emergency, suicidal or homicidal thoughts you must seek medical attention immediately by calling 911 or calling your MD immediately if symptoms less severe.  You must read complete instructions/literature along with all the possible adverse reactions/side effects for all the Medicines you take and that have been prescribed to you. Take any new Medicines after you have completely understood and accpet all the possible adverse reactions/side effects.   Do not drive when taking Pain medications.   Do not take more than prescribed Pain, Sleep and Anxiety Medications  Special Instructions: If you have smoked or chewed Tobacco in the last 2 yrs please stop smoking, stop any regular Alcohol and or any Recreational drug use.  Wear Seat belts while driving.  Please note  You were cared for by a hospitalist during your hospital stay. Once you are discharged, your  primary care physician will handle any further medical issues. Please note that NO REFILLS for any discharge medications will be authorized once you are discharged, as it is imperative that you return to your primary care physician (or establish a relationship with a primary care physician if you do not have one) for your aftercare needs so that they can reassess your need for medications and monitor your lab values.    Medication List    STOP taking these medications        indomethacin 50 MG capsule  Commonly known as:  INDOCIN      TAKE these medications        allopurinol 100 MG tablet  Commonly known as:  ZYLOPRIM  Take 1 tablet (100 mg total) by mouth daily as needed. Gout     feeding supplement (ENSURE ENLIVE) Liqd  Take 237 mLs by mouth 2 (two) times daily between meals.     fluticasone 110 MCG/ACT inhaler  Commonly known as:  FLOVENT HFA  Inhale 1 puff into the lungs 2 (two) times daily.     Ipratropium-Albuterol 20-100 MCG/ACT Aers respimat  Commonly known as:  COMBIVENT RESPIMAT  Inhale 1 puff into the lungs every 6 (six) hours as needed for wheezing.     nystatin 100000 UNIT/GM Powd  Use as directed to affected area up to three times per day     omeprazole 20 MG capsule  Commonly known as:  PRILOSEC  Take 1 capsule (20 mg total) by mouth 2 (two) times daily.     tamsulosin 0.4 MG Caps capsule  Commonly known as:  FLOMAX  Take 1 capsule (0.4 mg total) by mouth daily.           Follow-up Information    Follow up with Cathlean Cower, MD. Schedule an appointment as soon as possible for a visit in 2 weeks.   Specialties:  Internal Medicine, Radiology   Contact information:   [address] [place] [id number] [phone]       The results of significant diagnostics from this hospitalization (including imaging, microbiology, ancillary and laboratory) are listed below for reference.    Significant Diagnostic Studies: Ct Abdomen Pelvis Wo  Contrast  01/26/2015  CLINICAL DATA:  Lung cancer patient with metastatic disease to the brain. Diarrhea beginning in the last couple of days. EXAM: CT ABDOMEN AND PELVIS WITHOUT CONTRAST TECHNIQUE: Multidetector CT imaging of the abdomen and pelvis was performed following the standard protocol without IV contrast. COMPARISON:  None. FINDINGS: The liver has a normal appearance without contrast. No calcified gallstones. The spleen is normal. The pancreas is normal. The right adrenal gland is normal. There is a left adrenal mass measuring at least 4 cm in size highly likely to be a metastatic lesion. No renal lesion is seen. No stone or hydronephrosis. The aorta and its branch vessels show atherosclerosis but there is no aneurysm. The IVC is normal. No retroperitoneal mass or lymphadenopathy. No free fluid or air. The bladder appears normal. No evidence of bowel pathology to explain diarrhea. Lytic destructive mass of the left acetabulum with extension into the superior ramus. IMPRESSION: No abnormality seen to explain  diarrhea. No evidence of intrinsic bowel pathology. Left adrenal metastasis. Left acetabular metastasis which could result in pathologic fracture. Electronically Signed   By: Nelson Chimes M.D.   On: 01/26/2015 15:05   Dg Chest 2 View  01/26/2015  CLINICAL DATA:  Cough.  Hypothermia.  COPD.  History of lung cancer. EXAM: CHEST  2 VIEW COMPARISON:  12/08/2014 FINDINGS: Heart size normal.  Negative for heart failure. Port-A-Cath tip in the SVC from right jugular approach. Destructive lesion of the left second rib with extrapleural soft tissue mass. This may show mild progression in the interval. Ill-defined density overlying the right upper lobe may be related to a mass lesion of the right anterior third rib. This may also have progressed. Negative for pneumonia or effusion. IMPRESSION: Destructive lesion with soft tissue mass involving the left second rib with progression. There is a probable similar  lesion of the right anterior third rib. This may be due to multiple myeloma or metastatic disease. Chest CT recommended for further evaluation. Negative for heart failure or pneumonia. Electronically Signed   By: Franchot Gallo M.D.   On: 01/26/2015 13:12    Microbiology: Recent Results (from the past 240 hour(s))  Blood culture (routine x 2)     Status: None   Collection Time: 01/26/15  2:05 PM  Result Value Ref Range Status   Specimen Description BLOOD RIGHT PORTA CATH  Final   Special Requests BOTTLES DRAWN AEROBIC AND ANAEROBIC 5 CC EA  Final   Culture   Final    NO GROWTH 5 DAYS Performed at [facility]    Report Status 01/31/2015 FINAL  Final  Blood culture (routine x 2)     Status: None   Collection Time: 01/26/15  5:00 PM  Result Value Ref Range Status   Specimen Description BLOOD LEFT HAND  Final   Special Requests BOTTLES DRAWN AEROBIC ONLY 5CC  Final   Culture   Final    NO GROWTH 5 DAYS Performed at [facility]    Report Status 01/31/2015 FINAL  Final  Gastrointestinal Panel by PCR , Stool     Status: None   Collection Time: 01/26/15  9:34 PM  Result Value Ref Range Status   Campylobacter species NOT DETECTED NOT DETECTED Final   Plesimonas shigelloides NOT DETECTED NOT DETECTED Final   Salmonella species NOT DETECTED NOT DETECTED Final   Yersinia enterocolitica NOT DETECTED NOT DETECTED Final   Vibrio species NOT DETECTED NOT DETECTED Final   Vibrio cholerae NOT DETECTED NOT DETECTED Final   Enteroaggregative E coli (EAEC) NOT DETECTED NOT DETECTED Final   Enteropathogenic E coli (EPEC) NOT DETECTED NOT DETECTED Final   Enterotoxigenic E coli (ETEC) NOT DETECTED NOT DETECTED Final   Shiga like toxin producing E coli (STEC) NOT DETECTED NOT DETECTED Final   E. coli O157 NOT DETECTED NOT DETECTED Final   Shigella/Enteroinvasive E coli (EIEC) NOT DETECTED NOT DETECTED Final   Cryptosporidium NOT DETECTED NOT DETECTED Final   Cyclospora cayetanensis  NOT DETECTED NOT DETECTED Final   Entamoeba histolytica NOT DETECTED NOT DETECTED Final   Giardia lamblia NOT DETECTED NOT DETECTED Final   Adenovirus F40/41 NOT DETECTED NOT DETECTED Final   Astrovirus NOT DETECTED NOT DETECTED Final   Norovirus GI/GII NOT DETECTED NOT DETECTED Final   Rotavirus A NOT DETECTED NOT DETECTED Final   Sapovirus (I, II, IV, and V) NOT DETECTED NOT DETECTED Final  Culture, expectorated sputum-assessment     Status: None  Collection Time: 01/27/15  7:31 PM  Result Value Ref Range Status   Specimen Description SPUTUM  Final   Special Requests Immunocompromised  Final   Sputum evaluation THIS SPECIMEN IS ACCEPTABLE FOR SPUTUM CULTURE  Final   Report Status 01/27/2015 FINAL  Final  Culture, respiratory (NON-Expectorated)     Status: None   Collection Time: 01/27/15  7:31 PM  Result Value Ref Range Status   Specimen Description SPUTUM  Final   Special Requests NONE  Final   Gram Stain   Final    MODERATE WBC PRESENT,BOTH PMN AND MONONUCLEAR FEW SQUAMOUS EPITHELIAL CELLS PRESENT FEW GRAM POSITIVE COCCI IN CLUSTERS Performed at [facility]    Culture   Final    NORMAL OROPHARYNGEAL FLORA Performed at [facility]    Report Status 01/30/2015 FINAL  Final  C difficile quick scan w PCR reflex     Status: None   Collection Time: 01/27/15  7:35 PM  Result Value Ref Range Status   C Diff antigen NEGATIVE NEGATIVE Final   C Diff toxin NEGATIVE NEGATIVE Final   C Diff interpretation Negative for toxigenic C. difficile  Final  MRSA PCR Screening     Status: None   Collection Time: 01/28/15  3:14 PM  Result Value Ref Range Status   MRSA by PCR NEGATIVE NEGATIVE Final    Comment:        The GeneXpert MRSA Assay (FDA approved for NASAL specimens only), is one component of a comprehensive MRSA colonization surveillance program. It is not intended to diagnose MRSA infection nor to guide or monitor treatment for MRSA infections.      Labs: Basic Metabolic Panel:  Recent Labs Lab 01/26/15 1410 01/27/15 0555 01/28/15 0430 01/29/15 0432 01/30/15 0354 01/31/15 0503  NA 140 139 139 136 137 139  K 4.3 4.2 4.4 4.0 3.5 3.5  CL 109 110 109 109 110 111  CO2 21* 21* 20* 21* 20* 20*  GLUCOSE 94 109* 108* 102* 106* 98  BUN 33* 25* 21* '20 20 19  ' CREATININE 1.36* 1.41* 1.40* 1.82* 1.89* 1.72*  CALCIUM 9.0 8.5* 8.4* 8.4* 8.3* 8.4*  MG  --  1.7  --   --   --   --   PHOS  --  2.2*  --   --   --   --    Liver Function Tests:  Recent Labs Lab 01/26/15 1410 01/27/15 0555 01/29/15 0432 01/31/15 0503  AST 52* 45* 45* 52*  ALT '31 25 24 24  ' ALKPHOS 124 104 84 81  BILITOT 0.2* 0.3 0.4 0.3  PROT 8.2* 7.2 6.5 6.6  ALBUMIN 3.0* 2.6* 2.3* 2.3*    Recent Labs Lab 01/26/15 1410  LIPASE 20   CBC:  Recent Labs Lab 01/26/15 1410 01/27/15 0555 01/28/15 0430 01/29/15 0432 01/30/15 0354 01/31/15 0503  WBC 7.9 6.5 5.4 4.5 4.8 7.4  NEUTROABS 5.8  --   --  2.3  --   --   HGB 9.9* 9.0* 9.0* 8.4* 8.2* 8.4*  HCT 30.6* 27.8* 28.0* 26.0* 25.5* 25.8*  MCV 82.0 83.5 83.3 83.1 83.3 80.9  PLT 345 323 321 301 322 323   CBG:  Recent Labs Lab 01/27/15 0804 01/30/15 1157  GLUCAP 97 107*    Signed:  Desteni Piscopo  [facility] 02/01/2015, 2:13 PM

## 2015-02-02 ENCOUNTER — Telehealth: Payer: Self-pay

## 2015-02-02 NOTE — Telephone Encounter (Signed)
Called patient, he states that Dr. Jenny Reichmann is no longer his PCP. Dr. Nevada Crane is now his PCP and he states that he has a follow up appointment with him next week.

## 2015-02-16 DIAGNOSIS — J449 Chronic obstructive pulmonary disease, unspecified: Secondary | ICD-10-CM

## 2015-02-16 DIAGNOSIS — C3490 Malignant neoplasm of unspecified part of unspecified bronchus or lung: Secondary | ICD-10-CM

## 2015-02-16 DIAGNOSIS — C7931 Secondary malignant neoplasm of brain: Secondary | ICD-10-CM

## 2015-02-16 DIAGNOSIS — G9341 Metabolic encephalopathy: Secondary | ICD-10-CM | POA: Diagnosis not present

## 2015-03-16 ENCOUNTER — Emergency Department (HOSPITAL_COMMUNITY): Payer: Medicare HMO

## 2015-03-16 ENCOUNTER — Inpatient Hospital Stay (HOSPITAL_COMMUNITY)
Admission: EM | Admit: 2015-03-16 | Discharge: 2015-03-22 | DRG: 871 | Disposition: A | Discharge status: Home Health | Payer: Medicare HMO | Attending: Internal Medicine | Admitting: Internal Medicine

## 2015-03-16 ENCOUNTER — Encounter (HOSPITAL_COMMUNITY): Payer: Self-pay | Admitting: *Deleted

## 2015-03-16 DIAGNOSIS — Z820 Family history of epilepsy and other diseases of the nervous system: Secondary | ICD-10-CM | POA: Diagnosis not present

## 2015-03-16 DIAGNOSIS — K224 Dyskinesia of esophagus: Secondary | ICD-10-CM | POA: Diagnosis present

## 2015-03-16 DIAGNOSIS — C7951 Secondary malignant neoplasm of bone: Secondary | ICD-10-CM | POA: Diagnosis present

## 2015-03-16 DIAGNOSIS — R070 Pain in throat: Secondary | ICD-10-CM | POA: Diagnosis present

## 2015-03-16 DIAGNOSIS — J3801 Paralysis of vocal cords and larynx, unilateral: Secondary | ICD-10-CM | POA: Diagnosis present

## 2015-03-16 DIAGNOSIS — J042 Acute laryngotracheitis: Secondary | ICD-10-CM | POA: Diagnosis present

## 2015-03-16 DIAGNOSIS — I129 Hypertensive chronic kidney disease with stage 1 through stage 4 chronic kidney disease, or unspecified chronic kidney disease: Secondary | ICD-10-CM | POA: Diagnosis present

## 2015-03-16 DIAGNOSIS — E785 Hyperlipidemia, unspecified: Secondary | ICD-10-CM | POA: Diagnosis present

## 2015-03-16 DIAGNOSIS — Z7951 Long term (current) use of inhaled steroids: Secondary | ICD-10-CM

## 2015-03-16 DIAGNOSIS — I2510 Atherosclerotic heart disease of native coronary artery without angina pectoris: Secondary | ICD-10-CM | POA: Diagnosis present

## 2015-03-16 DIAGNOSIS — J441 Chronic obstructive pulmonary disease with (acute) exacerbation: Secondary | ICD-10-CM | POA: Diagnosis present

## 2015-03-16 DIAGNOSIS — N529 Male erectile dysfunction, unspecified: Secondary | ICD-10-CM | POA: Diagnosis present

## 2015-03-16 DIAGNOSIS — Z9221 Personal history of antineoplastic chemotherapy: Secondary | ICD-10-CM | POA: Diagnosis not present

## 2015-03-16 DIAGNOSIS — A419 Sepsis, unspecified organism: Principal | ICD-10-CM

## 2015-03-16 DIAGNOSIS — R05 Cough: Secondary | ICD-10-CM | POA: Diagnosis not present

## 2015-03-16 DIAGNOSIS — D573 Sickle-cell trait: Secondary | ICD-10-CM | POA: Diagnosis present

## 2015-03-16 DIAGNOSIS — Z809 Family history of malignant neoplasm, unspecified: Secondary | ICD-10-CM | POA: Diagnosis not present

## 2015-03-16 DIAGNOSIS — Z87891 Personal history of nicotine dependence: Secondary | ICD-10-CM | POA: Diagnosis not present

## 2015-03-16 DIAGNOSIS — K219 Gastro-esophageal reflux disease without esophagitis: Secondary | ICD-10-CM

## 2015-03-16 DIAGNOSIS — Z7982 Long term (current) use of aspirin: Secondary | ICD-10-CM

## 2015-03-16 DIAGNOSIS — Z7952 Long term (current) use of systemic steroids: Secondary | ICD-10-CM | POA: Diagnosis not present

## 2015-03-16 DIAGNOSIS — T380X5A Adverse effect of glucocorticoids and synthetic analogues, initial encounter: Secondary | ICD-10-CM | POA: Diagnosis present

## 2015-03-16 DIAGNOSIS — R1310 Dysphagia, unspecified: Secondary | ICD-10-CM | POA: Diagnosis present

## 2015-03-16 DIAGNOSIS — B1920 Unspecified viral hepatitis C without hepatic coma: Secondary | ICD-10-CM | POA: Diagnosis present

## 2015-03-16 DIAGNOSIS — J9621 Acute and chronic respiratory failure with hypoxia: Secondary | ICD-10-CM | POA: Diagnosis present

## 2015-03-16 DIAGNOSIS — C7972 Secondary malignant neoplasm of left adrenal gland: Secondary | ICD-10-CM | POA: Diagnosis present

## 2015-03-16 DIAGNOSIS — Z79899 Other long term (current) drug therapy: Secondary | ICD-10-CM

## 2015-03-16 DIAGNOSIS — D72829 Elevated white blood cell count, unspecified: Secondary | ICD-10-CM | POA: Diagnosis present

## 2015-03-16 DIAGNOSIS — N183 Chronic kidney disease, stage 3 unspecified: Secondary | ICD-10-CM | POA: Diagnosis present

## 2015-03-16 DIAGNOSIS — I82C19 Acute embolism and thrombosis of unspecified internal jugular vein: Secondary | ICD-10-CM | POA: Diagnosis present

## 2015-03-16 DIAGNOSIS — I10 Essential (primary) hypertension: Secondary | ICD-10-CM | POA: Diagnosis present

## 2015-03-16 DIAGNOSIS — C3490 Malignant neoplasm of unspecified part of unspecified bronchus or lung: Secondary | ICD-10-CM | POA: Diagnosis present

## 2015-03-16 DIAGNOSIS — Z66 Do not resuscitate: Secondary | ICD-10-CM | POA: Diagnosis present

## 2015-03-16 DIAGNOSIS — R059 Cough, unspecified: Secondary | ICD-10-CM

## 2015-03-16 DIAGNOSIS — N400 Enlarged prostate without lower urinary tract symptoms: Secondary | ICD-10-CM | POA: Diagnosis present

## 2015-03-16 DIAGNOSIS — Z681 Body mass index (BMI) 19 or less, adult: Secondary | ICD-10-CM | POA: Diagnosis not present

## 2015-03-16 DIAGNOSIS — C7931 Secondary malignant neoplasm of brain: Secondary | ICD-10-CM | POA: Diagnosis present

## 2015-03-16 DIAGNOSIS — E43 Unspecified severe protein-calorie malnutrition: Secondary | ICD-10-CM | POA: Diagnosis present

## 2015-03-16 DIAGNOSIS — I739 Peripheral vascular disease, unspecified: Secondary | ICD-10-CM | POA: Diagnosis present

## 2015-03-16 DIAGNOSIS — M109 Gout, unspecified: Secondary | ICD-10-CM | POA: Diagnosis present

## 2015-03-16 HISTORY — DX: Chronic kidney disease, stage 3 (moderate): N18.3

## 2015-03-16 LAB — PROCALCITONIN: Procalcitonin: 0.24 ng/mL

## 2015-03-16 LAB — I-STAT CG4 LACTIC ACID, ED
Lactic Acid, Venous: 2.01 mmol/L (ref 0.5–2.0)
Lactic Acid, Venous: 1.62 mmol/L (ref 0.5–2.0)

## 2015-03-16 LAB — CBC WITH DIFFERENTIAL/PLATELET
Basophils Absolute: 0 10*3/uL (ref 0.0–0.1)
Basophils Relative: 0 %
EOS PCT: 1 %
Eosinophils Absolute: 0.3 10*3/uL (ref 0.0–0.7)
HCT: 32.3 % — ABNORMAL LOW (ref 39.0–52.0)
HEMOGLOBIN: 10.7 g/dL — AB (ref 13.0–17.0)
Lymphocytes Relative: 16 %
Lymphs Abs: 4.3 10*3/uL — ABNORMAL HIGH (ref 0.7–4.0)
MCH: 26.6 pg (ref 26.0–34.0)
MCHC: 33.1 g/dL (ref 30.0–36.0)
MCV: 80.3 fL (ref 78.0–100.0)
MONO ABS: 3.7 10*3/uL — AB (ref 0.1–1.0)
Monocytes Relative: 14 %
Neutro Abs: 18.3 10*3/uL — ABNORMAL HIGH (ref 1.7–7.7)
Neutrophils Relative %: 69 %
PLATELETS: 397 10*3/uL (ref 150–400)
RBC: 4.02 MIL/uL — AB (ref 4.22–5.81)
RDW: 19.1 % — ABNORMAL HIGH (ref 11.5–15.5)
WBC: 26.6 10*3/uL — AB (ref 4.0–10.5)

## 2015-03-16 LAB — COMPREHENSIVE METABOLIC PANEL
ALK PHOS: 98 U/L (ref 38–126)
ALT: 34 U/L (ref 17–63)
AST: 52 U/L — AB (ref 15–41)
Albumin: 2.8 g/dL — ABNORMAL LOW (ref 3.5–5.0)
Anion gap: 13 (ref 5–15)
BILIRUBIN TOTAL: 0.5 mg/dL (ref 0.3–1.2)
BUN: 37 mg/dL — AB (ref 6–20)
CALCIUM: 8.9 mg/dL (ref 8.9–10.3)
CHLORIDE: 100 mmol/L — AB (ref 101–111)
CO2: 24 mmol/L (ref 22–32)
CREATININE: 1.55 mg/dL — AB (ref 0.61–1.24)
GFR, EST AFRICAN AMERICAN: 49 mL/min — AB (ref >60)
GFR, EST NON AFRICAN AMERICAN: 42 mL/min — AB (ref >60)
Glucose, Bld: 97 mg/dL (ref 65–99)
Potassium: 4.1 mmol/L (ref 3.5–5.1)
Sodium: 137 mmol/L (ref 135–145)
Total Protein: 7.9 g/dL (ref 6.5–8.1)

## 2015-03-16 LAB — URINE MICROSCOPIC-ADD ON: Bacteria, UA: NONE SEEN

## 2015-03-16 LAB — URINALYSIS, ROUTINE W REFLEX MICROSCOPIC
BILIRUBIN URINE: NEGATIVE
Glucose, UA: NEGATIVE mg/dL
KETONES UR: NEGATIVE mg/dL
Leukocytes, UA: NEGATIVE
Nitrite: NEGATIVE
PH: 6.5 (ref 5.0–8.0)
Protein, ur: NEGATIVE mg/dL
SPECIFIC GRAVITY, URINE: 1.012 (ref 1.005–1.030)

## 2015-03-16 LAB — PROTIME-INR
INR: 1.4 (ref 0.00–1.49)
Prothrombin Time: 16.8 seconds — ABNORMAL HIGH (ref 11.6–15.2)

## 2015-03-16 LAB — I-STAT TROPONIN, ED: Troponin i, poc: 0 ng/mL (ref 0.00–0.08)

## 2015-03-16 LAB — APTT: APTT: 28 s (ref 24–37)

## 2015-03-16 MED ORDER — IPRATROPIUM BROMIDE 0.02 % IN SOLN
0.5000 mg | RESPIRATORY_TRACT | Status: DC
  Administered 2015-03-16: 0.5 mg via RESPIRATORY_TRACT
  Filled 2015-03-16 (×2): qty 2.5

## 2015-03-16 MED ORDER — CELECOXIB 100 MG PO CAPS
100.0000 mg | ORAL_CAPSULE | Freq: Two times a day (BID) | ORAL | Status: DC | PRN
  Filled 2015-03-16: qty 1

## 2015-03-16 MED ORDER — ALLOPURINOL 100 MG PO TABS
100.0000 mg | ORAL_TABLET | Freq: Every day | ORAL | Status: DC
  Filled 2015-03-16: qty 1

## 2015-03-16 MED ORDER — ASPIRIN 81 MG PO CHEW
81.0000 mg | CHEWABLE_TABLET | Freq: Every day | ORAL | Status: DC
  Administered 2015-03-16 – 2015-03-22 (×7): 81 mg via ORAL
  Filled 2015-03-16 (×7): qty 1

## 2015-03-16 MED ORDER — ALBUTEROL SULFATE (2.5 MG/3ML) 0.083% IN NEBU
5.0000 mg | INHALATION_SOLUTION | Freq: Once | RESPIRATORY_TRACT | Status: AC
Start: 2015-03-16 — End: 2015-03-16
  Administered 2015-03-16: 5 mg via RESPIRATORY_TRACT
  Filled 2015-03-16: qty 6

## 2015-03-16 MED ORDER — ACETAMINOPHEN 500 MG PO TABS
1000.0000 mg | ORAL_TABLET | Freq: Once | ORAL | Status: AC
  Administered 2015-03-16: 1000 mg via ORAL
  Filled 2015-03-16: qty 2

## 2015-03-16 MED ORDER — ENOXAPARIN SODIUM 40 MG/0.4ML ~~LOC~~ SOLN
40.0000 mg | SUBCUTANEOUS | Status: DC
  Administered 2015-03-16 – 2015-03-21 (×6): 40 mg via SUBCUTANEOUS
  Filled 2015-03-16 (×6): qty 0.4

## 2015-03-16 MED ORDER — DEXTROSE 5 % IV SOLN
2.0000 g | Freq: Once | INTRAVENOUS | Status: AC
  Administered 2015-03-16: 2 g via INTRAVENOUS
  Filled 2015-03-16: qty 2

## 2015-03-16 MED ORDER — LEVALBUTEROL HCL 1.25 MG/0.5ML IN NEBU
1.2500 mg | INHALATION_SOLUTION | Freq: Once | RESPIRATORY_TRACT | Status: AC
  Administered 2015-03-16: 1.25 mg via RESPIRATORY_TRACT
  Filled 2015-03-16: qty 0.5

## 2015-03-16 MED ORDER — BACLOFEN 10 MG PO TABS
10.0000 mg | ORAL_TABLET | Freq: Three times a day (TID) | ORAL | Status: DC | PRN
  Administered 2015-03-19 – 2015-03-20 (×2): 10 mg via ORAL
  Filled 2015-03-16: qty 2
  Filled 2015-03-16: qty 1
  Filled 2015-03-16: qty 2

## 2015-03-16 MED ORDER — VANCOMYCIN HCL IN DEXTROSE 1-5 GM/200ML-% IV SOLN
1000.0000 mg | Freq: Once | INTRAVENOUS | Status: AC
  Administered 2015-03-16: 1000 mg via INTRAVENOUS
  Filled 2015-03-16: qty 200

## 2015-03-16 MED ORDER — METHYLPREDNISOLONE SODIUM SUCC 125 MG IJ SOLR
60.0000 mg | Freq: Two times a day (BID) | INTRAMUSCULAR | Status: DC
  Administered 2015-03-16 – 2015-03-20 (×8): 60 mg via INTRAVENOUS
  Filled 2015-03-16 (×8): qty 2

## 2015-03-16 MED ORDER — VANCOMYCIN HCL IN DEXTROSE 1-5 GM/200ML-% IV SOLN
1000.0000 mg | INTRAVENOUS | Status: DC
  Administered 2015-03-17: 1000 mg via INTRAVENOUS
  Filled 2015-03-16: qty 200

## 2015-03-16 MED ORDER — ONDANSETRON HCL 4 MG/2ML IJ SOLN: 4.0000 mg | Freq: Three times a day (TID) | INTRAMUSCULAR | Status: AC | PRN

## 2015-03-16 MED ORDER — IOHEXOL 350 MG/ML SOLN
100.0000 mL | Freq: Once | INTRAVENOUS | Status: AC | PRN
Start: 2015-03-16 — End: 2015-03-16
  Administered 2015-03-16: 100 mL via INTRAVENOUS

## 2015-03-16 MED ORDER — SODIUM CHLORIDE 0.9 % IV SOLN
1000.0000 mL | INTRAVENOUS | Status: DC
  Administered 2015-03-16 – 2015-03-17 (×2): 1000 mL via INTRAVENOUS

## 2015-03-16 MED ORDER — PANTOPRAZOLE SODIUM 40 MG PO TBEC
40.0000 mg | DELAYED_RELEASE_TABLET | Freq: Every day | ORAL | Status: DC
  Administered 2015-03-16 – 2015-03-22 (×7): 40 mg via ORAL
  Filled 2015-03-16 (×7): qty 1

## 2015-03-16 MED ORDER — ENSURE ENLIVE PO LIQD
237.0000 mL | Freq: Two times a day (BID) | ORAL | Status: DC
  Administered 2015-03-17 – 2015-03-22 (×10): 237 mL via ORAL

## 2015-03-16 MED ORDER — SODIUM CHLORIDE 0.9 % IV BOLUS (SEPSIS)
500.0000 mL | INTRAVENOUS | Status: AC
  Administered 2015-03-16: 500 mL via INTRAVENOUS

## 2015-03-16 MED ORDER — METRONIDAZOLE 500 MG PO TABS: 250.0000 mg | ORAL_TABLET | Freq: Four times a day (QID) | ORAL | Status: DC

## 2015-03-16 MED ORDER — PRAVASTATIN SODIUM 20 MG PO TABS
10.0000 mg | ORAL_TABLET | Freq: Every day | ORAL | Status: DC
  Administered 2015-03-16 – 2015-03-21 (×6): 10 mg via ORAL
  Filled 2015-03-16 (×6): qty 1

## 2015-03-16 MED ORDER — SODIUM CHLORIDE 0.9 % IV BOLUS (SEPSIS)
1000.0000 mL | Freq: Once | INTRAVENOUS | Status: DC
Start: 2015-03-16 — End: 2015-03-16

## 2015-03-16 MED ORDER — DM-GUAIFENESIN ER 30-600 MG PO TB12
1.0000 | ORAL_TABLET | Freq: Two times a day (BID) | ORAL | Status: DC
  Administered 2015-03-16 – 2015-03-17 (×2): 1 via ORAL
  Filled 2015-03-16 (×2): qty 1

## 2015-03-16 MED ORDER — LEVALBUTEROL HCL 1.25 MG/0.5ML IN NEBU
1.2500 mg | INHALATION_SOLUTION | Freq: Four times a day (QID) | RESPIRATORY_TRACT | Status: DC | PRN
  Filled 2015-03-16 (×2): qty 0.5

## 2015-03-16 MED ORDER — SODIUM CHLORIDE 0.9 % IV SOLN: INTRAVENOUS | Status: DC

## 2015-03-16 MED ORDER — IPRATROPIUM-ALBUTEROL 0.5-2.5 (3) MG/3ML IN SOLN
3.0000 mL | Freq: Three times a day (TID) | RESPIRATORY_TRACT | Status: DC
  Administered 2015-03-17: 3 mL via RESPIRATORY_TRACT
  Filled 2015-03-16 (×2): qty 3

## 2015-03-16 MED ORDER — DEXTROSE 5 % IV SOLN
2.0000 g | INTRAVENOUS | Status: DC
  Administered 2015-03-17 – 2015-03-19 (×3): 2 g via INTRAVENOUS
  Filled 2015-03-16 (×4): qty 2

## 2015-03-16 MED ORDER — SODIUM CHLORIDE 0.9 % IV BOLUS (SEPSIS)
1000.0000 mL | INTRAVENOUS | Status: AC
  Administered 2015-03-16 (×2): 1000 mL via INTRAVENOUS

## 2015-03-16 NOTE — Progress Notes (Signed)
Pharmacy Antibiotic Note  Alan Franco is a 75 y.o. male admitted on 03/16/2015 with pneumonia.   He has history of Lung cancer and was recently admitted in Jan 2017 with sepsis/ infectious diarrhea.   Pharmacy has been consulted for Vancomycin & Cefepime dosing. Renal function at patient's baseline.  Estimated CrCl ~ 93m/min.   Plan: Vancomycin '1000mg'$  IV every 24 hours.  Goal trough 15-20 mcg/mL. Cefepime 2gm IV q24h  Check Vancomycin trough at steady state Monitor renal function and cx data      Temp (24hrs), Avg:97.8 F (36.6 C), Min:97.8 F (36.6 C), Max:97.8 F (36.6 C)  No results for input(s): WBC, CREATININE, LATICACIDVEN, VANCOTROUGH, VANCOPEAK, VANCORANDOM, GENTTROUGH, GENTPEAK, GENTRANDOM, TOBRATROUGH, TOBRAPEAK, TOBRARND, AMIKACINPEAK, AMIKACINTROU, AMIKACIN in the last 168 hours.  CrCl cannot be calculated (Unknown ideal weight.).    No Known Allergies  Antimicrobials this admission: 2/24 Cefepime >>  2/24 Vanc >>   Dose adjustments this admission:   Microbiology results: 2/24 BCx: sent 2/24 UCx: ordered   Thank you for allowing pharmacy to be a part of this patient's care.  LBiagio Borg2/24/2017 3:24 PM

## 2015-03-16 NOTE — ED Notes (Signed)
Lactic Acid = 2.01, PA notified

## 2015-03-16 NOTE — ED Provider Notes (Signed)
CSN: [id number]     Arrival date & time 03/16/15  1357 History   First MD Initiated Contact with Patient 03/16/15 1511     Chief Complaint  Patient presents with  . ca patient    . Shortness of Breath  . Hiccups     (Consider location/radiation/quality/duration/timing/severity/associated sxs/prior Treatment) [name] is a 75 y.o. 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 Patient lives at home. Called ambulance for increased shortness of breath and weakness. Patient is on chemotherapy, last treatment 3 days ago. Patient was found to have oxygen saturation of 90% on room air, placed on 2 L. He received albuterol treatment by EMS. Patient also reports generalized weakness, he comes, chills for the last 4 days. Denies taking his temperature. He denies any chest pain. He denies any back pain. He states he was recently told that he fractured his left hip, states he has metastases to the hip. He still able to ambulate on that leg but with pain. He denies any recent travel. He denies any recent surgery. He denies swelling of his extremities.  Past Medical History  Diagnosis Date  . ALCOHOL ABUSE 08/24/2009  . CAROTID ARTERY DISEASE 08/30/2009  . CELLULITIS AND ABSCESS OF LEG EXCEPT FOOT 11/27/2009  . COPD 08/24/2009  . CORONARY ARTERY DISEASE 08/24/2009  . 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 GOUT 08/24/2009  . HEPATITIS C 08/24/2009  . HYPERLIPIDEMIA 08/24/2009  . HYPERTENSION 08/24/2009  . KNEE PAIN 02/14/2010  . MRSA 08/24/2009  . PERIPHERAL VASCULAR DISEASE 08/24/2009  . Preventative health care 08/09/2010  . RENAL INSUFFICIENCY 08/24/2009  . SICKLE CELL TRAIT 08/24/2009  . CKD (chronic kidney disease) 02/06/2011  . GERD (gastroesophageal reflux disease) 02/06/2011  . BPH (benign prostatic hyperplasia) 02/06/2011  .  Degenerative joint disease 08/10/2011   Past Surgical History  Procedure Laterality Date  . Knee surgury      arthoscopic on the right, Dr. French Ana  . Hand surgery Right    Family History  Problem Relation Age of Onset  . Alcohol abuse Mother   . Alcohol abuse Father   . Alzheimer's disease Father   . Colon cancer Neg Hx   . Cancer Sister    Social History  Substance Use Topics  . Smoking status: Former Research scientist (life sciences)  . Smokeless tobacco: Never Used  . Alcohol Use: No    Review of Systems  Constitutional: Positive for chills, activity change, appetite change and fatigue. Negative for fever.  Respiratory: Positive for cough, shortness of breath and wheezing. Negative for chest tightness.   Cardiovascular: Negative for chest pain, palpitations and leg swelling.  Gastrointestinal: Negative for nausea, vomiting, abdominal pain, diarrhea and abdominal distention.  Genitourinary: Negative for dysuria, urgency, frequency and hematuria.  Musculoskeletal: Positive for arthralgias. Negative for myalgias, neck pain and neck stiffness.  Skin: Negative for rash.  Allergic/Immunologic: Negative for immunocompromised state.  Neurological: Positive for weakness. Negative for dizziness, light-headedness, numbness and headaches.  All other systems reviewed and are negative.     Allergies  Review of patient's allergies indicates no known allergies.  Home Medications   Prior to Admission medications   Medication Sig Start Date End Date Taking? Authorizing Provider  feeding supplement, ENSURE ENLIVE, (ENSURE ENLIVE) LIQD Take 237 mLs by mouth 2 (two) times daily between meals. 12/09/14  Yes Florencia Reasons, MD  Ipratropium-Albuterol (COMBIVENT RESPIMAT) 20-100 MCG/ACT AERS respimat Inhale 1 puff into the lungs every 6 (six) hours as needed for wheezing. 11/02/12  Yes Biagio Borg, MD  allopurinol (ZYLOPRIM) 100 MG tablet Take 1 tablet (100 mg total) by mouth daily as needed. Gout 02/27/14 02/27/15  Janith Lima, MD  fluticasone (FLOVENT HFA) 110 MCG/ACT inhaler Inhale 1 puff into the lungs 2 (two) times daily. Patient not taking: Reported on 12/08/2014 08/17/14 03/13/18  Biagio Borg, MD  nystatin (MYCOSTATIN/NYSTOP) 100000 UNIT/GM POWD Use as directed to affected area up to three times per day Patient not taking: Reported on 08/06/2014 02/15/14   Biagio Borg, MD  omeprazole (PRILOSEC) 20 MG capsule Take 1 capsule (20 mg total) by mouth 2 (two) times daily. Patient not taking: Reported on 12/08/2014 02/06/11 03/02/15  Biagio Borg, MD  tamsulosin (FLOMAX) 0.4 MG CAPS capsule Take 1 capsule (0.4 mg total) by mouth daily. Patient not taking: Reported on 12/08/2014 12/14/13   Biagio Borg, MD   BP 152/79 mmHg  Pulse 136  Temp(Src) 97.8 F (36.6 C) (Oral)  Resp 18  SpO2 100% Physical Exam  Constitutional: He is oriented to person, place, and time. He appears well-developed and well-nourished.  Ill appearing  HENT:  Head: Normocephalic and atraumatic.  Eyes: Conjunctivae are normal.  Neck: Neck supple.  Cardiovascular: Regular rhythm and normal heart sounds.  Tachycardia present.   Pulmonary/Chest: Effort normal. No respiratory distress. He has wheezes. He has no rales. He exhibits no tenderness.  Abdominal: Soft. Bowel sounds are normal. He exhibits no distension. There is no tenderness. There is no rebound.  Musculoskeletal: He exhibits no edema.  Neurological: He is alert and oriented to person, place, and time.  Skin: Skin is warm and dry.  Nursing note and vitals reviewed.   ED Course  Procedures (including critical care time) Labs Review Labs Reviewed  COMPREHENSIVE METABOLIC PANEL - Abnormal; Notable for the following:    Chloride 100 (*)    BUN 37 (*)    Creatinine, Ser 1.55 (*)    Albumin 2.8 (*)    AST 52 (*)    GFR calc non Af Amer 42 (*)    GFR calc Af Amer 49 (*)    All other components within normal limits  CBC WITH DIFFERENTIAL/PLATELET - Abnormal; Notable for the  following:    WBC 26.6 (*)    RBC 4.02 (*)    Hemoglobin 10.7 (*)    HCT 32.3 (*)    RDW 19.1 (*)    Neutro Abs 18.3 (*)    Lymphs Abs 4.3 (*)    Monocytes Absolute 3.7 (*)    All other components within normal limits  URINALYSIS, ROUTINE W REFLEX MICROSCOPIC (NOT AT [facility]) - Abnormal; Notable for the following:    Hgb urine dipstick TRACE (*)    All other components within normal limits  URINE MICROSCOPIC-ADD ON - Abnormal; Notable for the following:    Squamous Epithelial / LPF 0-5 (*)    All other components within normal limits  I-STAT CG4 LACTIC ACID, ED - Abnormal; Notable for the following:    Lactic Acid, Venous 2.01 (*)    All other components within normal limits  CULTURE, BLOOD (ROUTINE X 2)  CULTURE, BLOOD (ROUTINE X 2)  URINE CULTURE  RESPIRATORY VIRUS PANEL  CULTURE, EXPECTORATED SPUTUM-ASSESSMENT  GRAM STAIN  INFLUENZA PANEL BY PCR (TYPE A & B, H1N1)  STREP PNEUMONIAE URINARY ANTIGEN  LACTIC ACID, PLASMA  LACTIC ACID, PLASMA  PROCALCITONIN  PROTIME-INR  APTT  I-STAT TROPOININ, ED  I-STAT CG4 LACTIC ACID, ED    Imaging Review Dg Chest 2 View  03/16/2015  CLINICAL DATA:  Shortness of breath, chills today. History of lung cancer and COPD. Last chemotherapy 4 days ago. EXAM: CHEST  2 VIEW COMPARISON:  01/26/2015 FINDINGS: Right Port-A-Cath remains in place, unchanged. Bony destruction within the left second rib with associated soft tissue mass again noted, likely not significantly changed. Vague opacity projecting over the right upper lobe near the porta catheter may reflect a soft tissue mass in the region of the anterior right third rib, also unchanged since prior study. Mild hyperinflation of the lungs. Heart is normal size. No effusions. IMPRESSION: Stable bony destruction and soft tissue mass involving the anterior left second rib. Possible anterior right third rib and soft tissue mass, stable. Mild hyperinflation/COPD. Electronically Signed   By: Rolm Baptise  M.D.   On: 03/16/2015 14:43   I have personally reviewed and evaluated these images and lab results as part of my medical decision-making.   EKG Interpretation   Date/Time:  Friday March 16 2015 14:21:36 EST Ventricular Rate:  113 PR Interval:  140 QRS Duration: 89 QT Interval:  328 QTC Calculation: 450 R Axis:   8 Text Interpretation:  Sinus tachycardia Borderline low voltage, extremity  leads No significant change was found Confirmed by CAMPOS  MD, Lennette Bihari  ([id number]) on 03/16/2015 2:24:06 PM      MDM   Final diagnoses:  Sepsis, due to unspecified organism ([place])  Cough    Patient in emergency department with increased shortness of breath, he has history of COPD as well as coronary disease stage IV lung cancer. He is followed by oncologist at a [place]. He reports cough, chills, productive sputum, generalized weakness. Concerning for pneumonia. Patient is on chemotherapy. Vital signs showed tachycardia with heart rate up to 150s on the monitor, sinus rhythm. Concerning for possible early sepsis, we'll initiate fluids and antibiotics. If creatinine normal, will consider doing CT injury to rule out PE. At this time patient is comfortable appearing. He feels much better after albuterol treatment, will order Xopenex treatment.  Sepsis - Repeat Assessment  Performed at:    5:30pm  Vitals     Blood pressure 126/67, pulse 102, temperature 102.4 F (39.1 C), temperature source Rectal, resp. rate 17, height '6\' 1"'$  (1.854 m), weight 70.308 kg, SpO2 99 %.  Heart:     Tachycardic  Lungs:    Wheezing  Capillary Refill:   <2 sec  Peripheral Pulse:   Radial pulse palpable and Dorsalis pedis pulse  palpable  Skin:     Normal Color   8:13 PM CT Levada Dy was obtained to rule out PE. It is negative other than diffuse metastases. No obvious pneumonia is seen as well. Patient's vital signs improved, heart rate down to 102. Blood pressure remains normal. Patient feels better after IV fluids and  breathing treatments. He still appears to be very ill and weak. Given his immunocompromise status, diffuse metastatic colon cancer, elevated white count, patient will be admitted to medicine for further evaluation and treatment. Will get flu swab for further evaluation. He is outside the window to start Tamiflu. I discussed with hospitalist who will see patient.  Filed Vitals:   03/16/15 1612 03/16/15 1730 03/16/15 1800 03/16/15 1906  BP:  112/72 112/68 126/67  Pulse:  111 111 102  Temp:      TempSrc:  Resp:  '18 15 17  '$ Height: '6\' 1"'$  (1.854 m)     Weight: 70.308 kg     SpO2:  100% 100% 99%     Jeannett Senior, PA-C 03/16/15 2015  Varney Biles, MD 03/17/15 2136  Varney Biles, MD 03/19/15 1654

## 2015-03-16 NOTE — ED Notes (Addendum)
Per EMS, pt complains of shortness of breath today, hiccups for 3 days, chills for the past 4 days. Pt has hx of lung cancer and COPD, last had chemo treatment on Tuesday. Pt was 90-91% on RA upon EMS arrival, 99% on 2L [place].

## 2015-03-16 NOTE — ED Notes (Signed)
Pt has port, RN sts he will get labs.

## 2015-03-16 NOTE — ED Notes (Signed)
Pt given meal tray.

## 2015-03-16 NOTE — H&P (Addendum)
[facility] History and Physical  Alan Franco HER:[id number] DOB: 08-05-1940 DOA: 03/16/2015  Referring physician: ED physician PCP: Cathlean Cower, MD  Specialists:   Chief Complaint: Cough and shortness of breath  HPI: Alan Franco is a 75 y.o. male with PMH of lung cancer (being treated by Dr. Vena Austria in [place], last chemotherapy was on 03/13/15), hyperlipidemia, COPD, GERD, gout, alcohol abuse (quit on 12/17/1989), carotid artery stenosis, CAD, HCV, PAD, chronic kidney disease-stage III, BPH, who presents with a cough and shortness of breath.  Patient reports that he has been having cough and shortness of breath in the past 3 days. He coughs up white mucus. He does not have fever, but has chills for 4 days. He has runny nose, but no sore throat. He also has Hiccups. He does not have chest pain, abdominal pain, nausea, vomiting, diarrhea, symptoms of UTI or unilateral weakness.  In ED, patient was found to have WBC 26.6, temperature while 2.4, tachycardia, transient tachypnea, stable renal function, lactate 2.01--> 1.62, negative troponin, negative urinalysis. CTA showed no PE or infiltration, but showed multiple bony metastatic lesions involving the rib cage and sternum, diffuse hilar and mediastinal adenopathy consistent with the patient's given clinical history of lung carcinoma. This contributes  to some degree of compression on the pulmonary artery centrally and eccentric to the left. Left adrenal metastatic disease. Patient is admitted to inpatient for further evaluation treatment.  EKG: Independently reviewed. QTC 450, tachycardia Where does patient live?   At home   Can patient participate in ADLs?  Little  Review of Systems:   General: no fevers, chills, no changes in body weight, has fatigue HEENT: no blurry vision, hearing changes or sore throat Pulm: has dyspnea, coughing, wheezing CV: no chest pain, no palpitations Abd: no nausea, vomiting, abdominal pain, diarrhea,  constipation GU: no dysuria, burning on urination, increased urinary frequency, hematuria  Ext: no leg edema Neuro: no unilateral weakness, numbness, or tingling, no vision change or hearing loss Skin: no rash MSK: No muscle spasm, no deformity, no limitation of range of movement in spin Heme: No easy bruising.  Travel history: No recent long distant travel.  Allergy: No Known Allergies  Past Medical History  Diagnosis Date  . ALCOHOL ABUSE 08/24/2009  . CAROTID ARTERY DISEASE 08/30/2009  . CELLULITIS AND ABSCESS OF LEG EXCEPT FOOT 11/27/2009  . COPD 08/24/2009  . CORONARY ARTERY DISEASE 08/24/2009  . 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 GOUT 08/24/2009  . HEPATITIS C 08/24/2009  . HYPERLIPIDEMIA 08/24/2009  . HYPERTENSION 08/24/2009  . KNEE PAIN 02/14/2010  . MRSA 08/24/2009  . PERIPHERAL VASCULAR DISEASE 08/24/2009  . Preventative health care 08/09/2010  . RENAL INSUFFICIENCY 08/24/2009  . SICKLE CELL TRAIT 08/24/2009  . CKD (chronic kidney disease), stage III 02/06/2011  . GERD (gastroesophageal reflux disease) 02/06/2011  . BPH (benign prostatic hyperplasia) 02/06/2011  . Degenerative joint disease 08/10/2011    Past Surgical History  Procedure Laterality Date  . Knee surgury      arthoscopic on the right, Dr. French Ana  . Hand surgery Right     Social History:  reports that he has quit smoking. He has never used smokeless tobacco. He reports that he does not drink alcohol or use illicit drugs.  Family History:  Family History  Problem Relation Age of Onset  . Alcohol abuse Mother   . Alcohol abuse Father   . Alzheimer's disease Father   . Colon cancer Neg Hx   . Cancer Sister  Prior to Admission medications   Medication Sig Start Date End Date Taking? Authorizing Provider  aspirin 81 MG chewable tablet Chew 81 mg by mouth daily.   Yes Historical Provider, MD  baclofen (LIORESAL) 10 MG tablet Take 10-20 mg by mouth 3 (three) times daily as needed (hiccups).   Yes Historical  Provider, MD  celecoxib (CELEBREX) 100 MG capsule Take 100 mg by mouth 2 (two) times daily as needed for moderate pain.   Yes Historical Provider, MD  dexamethasone (DECADRON) 1 MG tablet Take 2 mg by mouth 2 (two) times daily with a meal. For 10 Days.   Yes Historical Provider, MD  feeding supplement, ENSURE ENLIVE, (ENSURE ENLIVE) LIQD Take 237 mLs by mouth 2 (two) times daily between meals. 12/09/14  Yes Florencia Reasons, MD  Ipratropium-Albuterol (COMBIVENT RESPIMAT) 20-100 MCG/ACT AERS respimat Inhale 1 puff into the lungs every 6 (six) hours as needed for wheezing. 11/02/12  Yes Biagio Borg, MD  metroNIDAZOLE (FLAGYL) 250 MG tablet Take 250 mg by mouth 4 (four) times daily.   Yes Historical Provider, MD  naproxen sodium (ANAPROX) 220 MG tablet Take 440 mg by mouth 2 (two) times daily as needed (headache).   Yes Historical Provider, MD  potassium chloride SA (K-DUR,KLOR-CON) 20 MEQ tablet Take 20 mEq by mouth 2 (two) times daily.   Yes Historical Provider, MD  pravastatin (PRAVACHOL) 10 MG tablet Take 10 mg by mouth daily.   Yes Historical Provider, MD  allopurinol (ZYLOPRIM) 100 MG tablet Take 1 tablet (100 mg total) by mouth daily as needed. Gout 02/27/14 02/27/15  Janith Lima, MD  fluticasone (FLOVENT HFA) 110 MCG/ACT inhaler Inhale 1 puff into the lungs 2 (two) times daily. Patient not taking: Reported on 12/08/2014 08/17/14 03/13/18  Biagio Borg, MD  nystatin (MYCOSTATIN/NYSTOP) 100000 UNIT/GM POWD Use as directed to affected area up to three times per day Patient not taking: Reported on 08/06/2014 02/15/14   Biagio Borg, MD  omeprazole (PRILOSEC) 20 MG capsule Take 1 capsule (20 mg total) by mouth 2 (two) times daily. Patient not taking: Reported on 12/08/2014 02/06/11 03/02/15  Biagio Borg, MD  tamsulosin (FLOMAX) 0.4 MG CAPS capsule Take 1 capsule (0.4 mg total) by mouth daily. Patient not taking: Reported on 12/08/2014 12/14/13   Biagio Borg, MD    Physical Exam: Filed Vitals:   03/16/15  1906 03/16/15 1930 03/16/15 2000 03/16/15 2030  BP: 126/67 112/57 106/66 118/71  Pulse: 102 103 107 109  Temp:      TempSrc:      Resp: '17 19 21 15  '$ Height:      Weight:      SpO2: 99% 97% 98% 96%   General: Not in acute distress. Dry mucus and membrane. HEENT:       Eyes: PERRL, EOMI, no scleral icterus.       ENT: No discharge from the ears and nose, no pharynx injection, no tonsillar enlargement.        Neck: No JVD, no bruit, no mass felt. Heme: No neck lymph node enlargement. Cardiac: S1/S2, RRR, No murmurs, No gallops or rubs. Pulm: Mild wheezing bilaterally, no rales or rubs. Abd: Soft, nondistended, nontender, no rebound pain, no organomegaly, BS present. Ext: No pitting leg edema bilaterally. 2+DP/PT pulse bilaterally. Musculoskeletal: No joint deformities, No joint redness or warmth, no limitation of ROM in spin. Skin: No rashes.  Neuro: Alert, oriented X3, cranial nerves II-XII grossly intact, moves all extremities normally.  Psych:  Patient is not psychotic, no suicidal or hemocidal ideation.  Labs on Admission:  Basic Metabolic Panel:  Recent Labs Lab 03/16/15 1548  NA 137  K 4.1  CL 100*  CO2 24  GLUCOSE 97  BUN 37*  CREATININE 1.55*  CALCIUM 8.9   Liver Function Tests:  Recent Labs Lab 03/16/15 1548  AST 52*  ALT 34  ALKPHOS 98  BILITOT 0.5  PROT 7.9  ALBUMIN 2.8*   No results for input(s): LIPASE, AMYLASE in the last 168 hours. No results for input(s): AMMONIA in the last 168 hours. CBC:  Recent Labs Lab 03/16/15 1548  WBC 26.6*  NEUTROABS 18.3*  HGB 10.7*  HCT 32.3*  MCV 80.3  PLT 397   Cardiac Enzymes: No results for input(s): CKTOTAL, CKMB, CKMBINDEX, TROPONINI in the last 168 hours.  BNP (last 3 results) No results for input(s): BNP in the last 8760 hours.  ProBNP (last 3 results) No results for input(s): PROBNP in the last 8760 hours.  CBG: No results for input(s): GLUCAP in the last 168 hours.  Radiological Exams on  Admission: Dg Chest 2 View  03/16/2015  CLINICAL DATA:  Shortness of breath, chills today. History of lung cancer and COPD. Last chemotherapy 4 days ago. EXAM: CHEST  2 VIEW COMPARISON:  01/26/2015 FINDINGS: Right Port-A-Cath remains in place, unchanged. Bony destruction within the left second rib with associated soft tissue mass again noted, likely not significantly changed. Vague opacity projecting over the right upper lobe near the porta catheter may reflect a soft tissue mass in the region of the anterior right third rib, also unchanged since prior study. Mild hyperinflation of the lungs. Heart is normal size. No effusions. IMPRESSION: Stable bony destruction and soft tissue mass involving the anterior left second rib. Possible anterior right third rib and soft tissue mass, stable. Mild hyperinflation/COPD. Electronically Signed   By: Rolm Baptise M.D.   On: 03/16/2015 14:43   Ct Angio Chest Pe W/cm &/or Wo Cm  03/16/2015  CLINICAL DATA:  Shortness of Breath EXAM: CT ANGIOGRAPHY CHEST WITH CONTRAST TECHNIQUE: Multidetector CT imaging of the chest was performed using the standard protocol during bolus administration of intravenous contrast. Multiplanar CT image reconstructions and MIPs were obtained to evaluate the vascular anatomy. CONTRAST:  140m OMNIPAQUE IOHEXOL 350 MG/ML SOLN COMPARISON:  Plain film from earlier in the same day, 01/26/2015 FINDINGS: Lungs are well aerated bilaterally. Emphysematous changes are again seen. No acute infiltrate or sizable effusion is noted. The thoracic inlet is within normal limits. There are multiple mediastinal lymph nodes identified. 2.2 cm node is noted adjacent to the origins of the left subclavian and left common carotid artery. A 1.7 cm lymph node is noted adjacent to the origin of the right subclavian artery. Conglomeration of lymph nodes is noted in the aortic or pulmonary window. This measures approximately 5.0 x 5.2 cm. Precarinal and subcarinal adenopathy is  noted as well. Scattered hilar lymph nodes are noted. The largest of these is mid noted on the right measuring 2.1 cm in short axis. The thoracic aorta shows calcifications although no findings of dissection or aneurysmal dilatation are seen. The pulmonary artery is well visualized and mildly impinged centrally and on the left due to the lymphadenopathy. No filling defects to suggest pulmonary emboli are noted. The visualized upper abdomen demonstrates metastatic disease within the left adrenal gland similar to that seen on prior exam. The bony structures show degenerative change of the thoracic spine. Additionally there are expansile lesions  within the rib cage bilaterally involving primarily the right third rib anteriorly as well of the left second rib. Expansile lesion of the right fifth rib is noted as well. Destructive lesion of the mid sternum is noted as well. These changes are consistent with metastatic disease. Right chest wall port is again noted. Review of the MIP images confirms the above findings. IMPRESSION: Multiple bony metastatic lesions involving the rib cage bilaterally as well as the sternum. Diffuse hilar and mediastinal adenopathy consistent with the patient's given clinical history of lung carcinoma. This contributes to some degree of compression on the pulmonary artery centrally and eccentric to the left. Left adrenal metastatic disease No evidence of pulmonary emboli. Chronic changes in the lungs bilaterally. Although not mentioned in the body of the report there is significant left axillary and chest wall lymphadenopathy identified. Electronically Signed   By: Inez Catalina M.D.   On: 03/16/2015 18:39    Assessment/Plan Principal Problem:   Acute on chronic respiratory failure with hypoxia (HCC) Active Problems:   HLD (hyperlipidemia)   SICKLE CELL TRAIT   Essential hypertension   Coronary atherosclerosis   COPD exacerbation (HCC)   GERD (gastroesophageal reflux disease)   BPH  (benign prostatic hyperplasia)   Sepsis (HCC)   Lung cancer (HCC)   Protein-calorie malnutrition, severe   CKD (chronic kidney disease), stage III   Acute on chronic respiratory failure with hypoxia ([place]): Likely due to COPD exacerbation. CT angiogram of chest did not show pulmonary embolism or infiltration. Patient has wheezing bilaterally, indicating COPD exacerbation.  -will admit patient to telemetry bed  -Nebulizers: scheduled Atrovent and prn xopenex -Solu-Medrol 60 mg IV bid -ED started IV vancomycin cefepime. Will continue broad coverage since patient is immunosuppressed from chemotherapy.  -Mucinex for cough  -Urine S. pneumococcal antigen -Follow up blood culture x2, sputum culture, respiratory virus panel, Flu pcr  Sepsis due to COPD exacerbation: Patient is septic with leukocytosis, fever, tachycardia and elevated lactate. His lactate is normalized after treated with IV fluids in the emergency room. Lactate 2.01-->1.62. Currently hemodynamically stable. -will get Procalcitonin -IVF: 2.5L of NS bolus in ED, followed by 125 cc/h   Lung Cancer: being treated by Dr. Vena Austria in [place], last chemotherapy was on 03/13/15. -f/u with primary oncologist in [place]: Last LDL was 103 -Continue home medications: Pravachol  Protein-calorie malnutrition, severe: -Ensure  CKD-III: Stable. Baseline creatinine 1.4-1.8, his creatinine is 1.55, BUN 37. Follow-up by BMP  CAD: no CP. -continue ASA and pravastatin  GERD: -Protonix  BPH: stable - Continue Flomax  Recently treated diarrhea: pt was recently hospitalized from a 1/2-1/12/17. During that admission, patient was treated for diarrhea with Flagyl for 7 days. Patient had negative C. difficile test. He was not discharged on Flagyl, but for unclear reason, patient was taking Flagyl at home for a while and then stopped taking it 10 days ago. Patient does not have abdominal pain or diarrhea. No nausea vomiting. -will observe pt  closely.  DVT ppx: SQ Lovenox  Code Status: DNR Family Communication: None at bed side.   Disposition Plan: Admit to inpatient   Date of Service 03/16/2015    Ivor Costa [facility] Pager [phone]  If 7PM-7AM, please contact night-coverage [email or web address] Password [facility] 03/16/2015, 9:11 PM

## 2015-03-16 NOTE — ED Notes (Signed)
Bed: WA01 Expected date:  Expected time:  Means of arrival:  Comments: EMS- 75yo M, [place] Pt/SOB

## 2015-03-17 DIAGNOSIS — D573 Sickle-cell trait: Secondary | ICD-10-CM

## 2015-03-17 LAB — MRSA PCR SCREENING: MRSA by PCR: NEGATIVE

## 2015-03-17 LAB — INFLUENZA PANEL BY PCR (TYPE A & B)
H1N1 flu by pcr: NOT DETECTED
Influenza A By PCR: NEGATIVE
Influenza B By PCR: NEGATIVE

## 2015-03-17 LAB — STREP PNEUMONIAE URINARY ANTIGEN: STREP PNEUMO URINARY ANTIGEN: NEGATIVE

## 2015-03-17 MED ORDER — SODIUM CHLORIDE 0.9% FLUSH
10.0000 mL | Freq: Two times a day (BID) | INTRAVENOUS | Status: DC
  Administered 2015-03-18: 10 mL

## 2015-03-17 MED ORDER — MENTHOL 3 MG MT LOZG
1.0000 | LOZENGE | OROMUCOSAL | Status: DC | PRN
  Filled 2015-03-17: qty 9

## 2015-03-17 MED ORDER — GUAIFENESIN-DM 100-10 MG/5ML PO SYRP: 5.0000 mL | ORAL_SOLUTION | ORAL | Status: DC | PRN

## 2015-03-17 MED ORDER — SODIUM CHLORIDE 0.9% FLUSH
10.0000 mL | INTRAVENOUS | Status: DC | PRN
  Administered 2015-03-20: 10 mL
  Filled 2015-03-17: qty 40

## 2015-03-17 MED ORDER — GUAIFENESIN ER 600 MG PO TB12
1200.0000 mg | ORAL_TABLET | Freq: Two times a day (BID) | ORAL | Status: DC
  Administered 2015-03-17 – 2015-03-22 (×11): 1200 mg via ORAL
  Filled 2015-03-17 (×11): qty 2

## 2015-03-17 MED ORDER — IPRATROPIUM-ALBUTEROL 0.5-2.5 (3) MG/3ML IN SOLN
3.0000 mL | Freq: Four times a day (QID) | RESPIRATORY_TRACT | Status: DC | PRN
  Administered 2015-03-20: 3 mL via RESPIRATORY_TRACT
  Filled 2015-03-17: qty 3

## 2015-03-17 MED ORDER — SODIUM CHLORIDE 0.9 % IV SOLN
1000.0000 mL | INTRAVENOUS | Status: DC
  Administered 2015-03-17 – 2015-03-19 (×3): 1000 mL via INTRAVENOUS

## 2015-03-17 NOTE — Evaluation (Signed)
Physical Therapy Evaluation Patient Details Name: Alan Franco MRN: [id number] DOB: 07/30/1940 Today's Date: 03/17/2015   History of Present Illness  Alan Franco is a 75 y.o. male with PMH of lung cancer, hyperlipidemia, COPD, GERD, gout, alcohol abuse, carotid artery stenosis, CAD, HCV, PAD, chronic kidney disease, 75 yo male admitted with diarrhea of infectious origin, acute encephalopathy. hx of CAD, COPD, stage IV lung cancer with brain mets, L acetabular mets, L 2nd rib mass-, who presents 03/16/15 with a cough and shortness of breath.  CTA showed no PE or infiltration, but showed multiple bony metastatic lesions involving the rib cage and sternum, diffuse hilar and mediastinal adenopathy consistent with the patient's given clinical history of lung carcinoma. This contributes to some degree of compression on the pulmonary artery centrally and Left adrenal metastatic disease.   Clinical Impression  The patient is mobilizing well. Pt admitted with above diagnosis. Pt currently with functional limitations due to the deficits listed below (see PT Problem List).  Pt will benefit from skilled PT to increase their independence and safety with mobility to allow discharge to home.       Follow Up Recommendations No PT follow up    Equipment Recommendations  None recommended by PT    Recommendations for Other Services       Precautions / Restrictions Precautions Precautions: Fall Precaution Comments: h/o acetab mets in 1/17 Restrictions Weight Bearing Restrictions: No Other Position/Activity Restrictions: no WB restrictions last admit      Mobility  Bed Mobility Overal bed mobility: Independent                Transfers Overall transfer level: Modified independent               General transfer comment: reports walking to BR with IV pole, no assist  Ambulation/Gait Ambulation/Gait assistance: Supervision Ambulation Distance (Feet): 100 Feet Assistive device:  Rolling walker (2 wheeled) Gait Pattern/deviations: Step-through pattern     General Gait Details: steady gait  Stairs            Wheelchair Mobility    Modified Rankin (Stroke Patients Only)       Balance                                             Pertinent Vitals/Pain Pain Assessment: No/denies pain    Home Living Family/patient expects to be discharged to:: Private residence Living Arrangements: Spouse/significant other Available Help at Discharge: Family Type of Home: House Home Access: Stairs to enter   Technical brewer of Steps: 3 Home Layout: One level Home Equipment: Wheelchair - power;Walker - 2 wheels      Prior Function Level of Independence: Needs assistance   Gait / Transfers Assistance Needed: ambulatory for short distances. with RW/opower chair           Hand Dominance        Extremity/Trunk Assessment   Upper Extremity Assessment: Overall WFL for tasks assessed           Lower Extremity Assessment: Overall WFL for tasks assessed         Communication   Communication: No difficulties (voice is vdry raspy )  Cognition Arousal/Alertness: Awake/alert Behavior During Therapy: WFL for tasks assessed/performed Overall Cognitive Status: Within Functional Limits for tasks assessed  General Comments      Exercises        Assessment/Plan    PT Assessment Patient needs continued PT services  PT Diagnosis Generalized weakness   PT Problem List Decreased strength;Decreased activity tolerance;Decreased mobility  PT Treatment Interventions DME instruction;Gait training;Functional mobility training;Therapeutic activities   PT Goals (Current goals can be found in the Care Plan section) Acute Rehab PT Goals Patient Stated Goal: to go home PT Goal Formulation: With patient Time For Goal Achievement: 03/31/15 Potential to Achieve Goals: Good    Frequency Min 3X/week    Barriers to discharge        Co-evaluation               End of Session Equipment Utilized During Treatment: Gait belt Activity Tolerance: Patient tolerated treatment well Patient left: in chair;with call bell/phone within reach Nurse Communication: Mobility status         Time: 1142-1201 PT Time Calculation (min) (ACUTE ONLY): 19 min   Charges:   PT Evaluation $PT Eval Low Complexity: 1 Procedure     PT G CodesClaretha Cooper 03/17/2015, 12:50 PM  Tresa Endo PT [phone]

## 2015-03-17 NOTE — Progress Notes (Signed)
[facility] PROGRESS NOTE   Alan Franco ZOX:[id number] DOB: [birth year] DOA: 03/16/2015 PCP: Cathlean Cower, MD  HPI/Subjective: Seen with significant other bedside, denies any fever or chills. Some shortness of breath, cough with minimal sputum production. Complaining about raw throat.   Assessment/Plan: Principal Problem:   Acute on chronic respiratory failure with hypoxia (HCC) Active Problems:   HLD (hyperlipidemia)   SICKLE CELL TRAIT   Essential hypertension   Coronary atherosclerosis   COPD exacerbation (HCC)   GERD (gastroesophageal reflux disease)   BPH (benign prostatic hyperplasia)   Sepsis (HCC)   Lung cancer (HCC)   Protein-calorie malnutrition, severe   CKD (chronic kidney disease), stage III   Acute on chronic respiratory failure with hypoxia Likely due to COPD exacerbation. CT angiogram of chest did not show pulmonary embolism or infiltration.  Patient has wheezing bilaterally, indicating COPD exacerbation. Continue oxygen supplementation as needed.  Acute COPD exacerbation Started on IV antibiotics and IV steroids. Continue supportive management with bronchodilators, mucolytics, antitussives and oxygen as needed.  Sepsis due to COPD exacerbation:  Patient is septic with leukocytosis, fever, tachycardia and elevated lactate.  His lactate is normalized after treated with IV fluids in the emergency room.  Lactate 2.01-->1.62. Currently hemodynamically stable. -Procalcitonin 0.24 continue antibiotics.  Lung Cancer: being treated by Dr. Vena Austria in [place], last chemotherapy was on 03/13/15. -f/u with primary oncologist in [place]: Last LDL was 103 -Continue home medications: Pravachol  Protein-calorie malnutrition, severe: -Ensure  CKD-III: Stable. Baseline creatinine 1.4-1.8, his creatinine is 1.55, BUN 37. Follow-up by BMP  CAD: no CP. -continue ASA and pravastatin  GERD: -Protonix  BPH: stable - Continue Flomax  Recently treated  diarrhea: pt was recently hospitalized from a 1/2-1/12/17. During that admission, patient was treated for diarrhea with Flagyl for 7 days. Patient had negative C. difficile test. He was not discharged on Flagyl, but for unclear reason, patient was taking Flagyl at home for a while and then stopped taking it 10 days ago. Patient does not have abdominal pain or diarrhea. No nausea vomiting. -Started on Florastor  Code Status: DNR Family Communication: Plan discussed with the patient. Disposition Plan: Remains inpatient Diet: Diet Heart Room service appropriate?: Yes; Fluid consistency:: Thin  Consultants:  None  Procedures:  None  Antibiotics:  None   Objective: Filed Vitals:   03/16/15 2247 03/17/15 0425  BP: 126/60 144/80  Pulse: 96 96  Temp: 97.9 F (36.6 C) 97.2 F (36.2 C)  Resp: 16 16    Intake/Output Summary (Last 24 hours) at 03/17/15 1212 Last data filed at 03/17/15 0900  Gross per 24 hour  Intake 1971.25 ml  Output    925 ml  Net 1046.25 ml   Filed Weights   03/16/15 1612 03/16/15 2247  Weight: 70.308 kg (155 lb) 66.906 kg (147 lb 8 oz)    Exam: General: Alert and awake, oriented x3, not in any acute distress. HEENT: anicteric sclera, pupils reactive to light and accommodation, EOMI CVS: S1-S2 clear, no murmur rubs or gallops Chest: clear to auscultation bilaterally, no wheezing, rales or rhonchi Abdomen: soft nontender, nondistended, normal bowel sounds, no organomegaly Extremities: no cyanosis, clubbing or edema noted bilaterally Neuro: Cranial nerves II-XII intact, no focal neurological deficits  Data Reviewed: Basic Metabolic Panel:  Recent Labs Lab 03/16/15 1548  NA 137  K 4.1  CL 100*  CO2 24  GLUCOSE 97  BUN 37*  CREATININE 1.55*  CALCIUM 8.9   Liver Function Tests:  Recent Labs  Lab 03/16/15 1548  AST 52*  ALT 34  ALKPHOS 98  BILITOT 0.5  PROT 7.9  ALBUMIN 2.8*   No results for input(s): LIPASE, AMYLASE in the last 168  hours. No results for input(s): AMMONIA in the last 168 hours. CBC:  Recent Labs Lab 03/16/15 1548  WBC 26.6*  NEUTROABS 18.3*  HGB 10.7*  HCT 32.3*  MCV 80.3  PLT 397   Cardiac Enzymes: No results for input(s): CKTOTAL, CKMB, CKMBINDEX, TROPONINI in the last 168 hours. BNP (last 3 results) No results for input(s): BNP in the last 8760 hours.  ProBNP (last 3 results) No results for input(s): PROBNP in the last 8760 hours.  CBG: No results for input(s): GLUCAP in the last 168 hours.  Micro Recent Results (from the past 240 hour(s))  Blood Culture (routine x 2)     Status: None (Preliminary result)   Collection Time: 03/16/15  3:47 PM  Result Value Ref Range Status   Specimen Description BLOOD PORTA CATH  Final   Special Requests BOTTLES DRAWN AEROBIC AND ANAEROBIC 5ML  Final   Culture   Final    NO GROWTH < 24 HOURS Performed at [facility]    Report Status PENDING  Incomplete  Blood Culture (routine x 2)     Status: None (Preliminary result)   Collection Time: 03/16/15  4:05 PM  Result Value Ref Range Status   Specimen Description BLOOD LEFT HAND  Final   Special Requests IN PEDIATRIC BOTTLE 3 CC  Final   Culture   Final    NO GROWTH < 24 HOURS Performed at [facility]    Report Status PENDING  Incomplete  MRSA PCR Screening     Status: None   Collection Time: 03/16/15 11:13 PM  Result Value Ref Range Status   MRSA by PCR NEGATIVE NEGATIVE Final    Comment:        The GeneXpert MRSA Assay (FDA approved for NASAL specimens only), is one component of a comprehensive MRSA colonization surveillance program. It is not intended to diagnose MRSA infection nor to guide or monitor treatment for MRSA infections.      Studies: Dg Chest 2 View  03/16/2015  CLINICAL DATA:  Shortness of breath, chills today. History of lung cancer and COPD. Last chemotherapy 4 days ago. EXAM: CHEST  2 VIEW COMPARISON:  01/26/2015 FINDINGS: Right Port-A-Cath remains  in place, unchanged. Bony destruction within the left second rib with associated soft tissue mass again noted, likely not significantly changed. Vague opacity projecting over the right upper lobe near the porta catheter may reflect a soft tissue mass in the region of the anterior right third rib, also unchanged since prior study. Mild hyperinflation of the lungs. Heart is normal size. No effusions. IMPRESSION: Stable bony destruction and soft tissue mass involving the anterior left second rib. Possible anterior right third rib and soft tissue mass, stable. Mild hyperinflation/COPD. Electronically Signed   By: Rolm Baptise M.D.   On: 03/16/2015 14:43   Ct Angio Chest Pe W/cm &/or Wo Cm  03/16/2015  CLINICAL DATA:  Shortness of Breath EXAM: CT ANGIOGRAPHY CHEST WITH CONTRAST TECHNIQUE: Multidetector CT imaging of the chest was performed using the standard protocol during bolus administration of intravenous contrast. Multiplanar CT image reconstructions and MIPs were obtained to evaluate the vascular anatomy. CONTRAST:  132m OMNIPAQUE IOHEXOL 350 MG/ML SOLN COMPARISON:  Plain film from earlier in the same day, 01/26/2015 FINDINGS: Lungs are well aerated bilaterally. Emphysematous changes  are again seen. No acute infiltrate or sizable effusion is noted. The thoracic inlet is within normal limits. There are multiple mediastinal lymph nodes identified. 2.2 cm node is noted adjacent to the origins of the left subclavian and left common carotid artery. A 1.7 cm lymph node is noted adjacent to the origin of the right subclavian artery. Conglomeration of lymph nodes is noted in the aortic or pulmonary window. This measures approximately 5.0 x 5.2 cm. Precarinal and subcarinal adenopathy is noted as well. Scattered hilar lymph nodes are noted. The largest of these is mid noted on the right measuring 2.1 cm in short axis. The thoracic aorta shows calcifications although no findings of dissection or aneurysmal dilatation are  seen. The pulmonary artery is well visualized and mildly impinged centrally and on the left due to the lymphadenopathy. No filling defects to suggest pulmonary emboli are noted. The visualized upper abdomen demonstrates metastatic disease within the left adrenal gland similar to that seen on prior exam. The bony structures show degenerative change of the thoracic spine. Additionally there are expansile lesions within the rib cage bilaterally involving primarily the right third rib anteriorly as well of the left second rib. Expansile lesion of the right fifth rib is noted as well. Destructive lesion of the mid sternum is noted as well. These changes are consistent with metastatic disease. Right chest wall port is again noted. Review of the MIP images confirms the above findings. IMPRESSION: Multiple bony metastatic lesions involving the rib cage bilaterally as well as the sternum. Diffuse hilar and mediastinal adenopathy consistent with the patient's given clinical history of lung carcinoma. This contributes to some degree of compression on the pulmonary artery centrally and eccentric to the left. Left adrenal metastatic disease No evidence of pulmonary emboli. Chronic changes in the lungs bilaterally. Although not mentioned in the body of the report there is significant left axillary and chest wall lymphadenopathy identified. Electronically Signed   By: Inez Catalina M.D.   On: 03/16/2015 18:39    Scheduled Meds: . aspirin  81 mg Oral Daily  . ceFEPime (MAXIPIME) IV  2 g Intravenous Q24H  . dextromethorphan-guaiFENesin  1 tablet Oral BID  . enoxaparin (LOVENOX) injection  40 mg Subcutaneous Q24H  . feeding supplement (ENSURE ENLIVE)  237 mL Oral BID BM  . ipratropium-albuterol  3 mL Nebulization TID  . methylPREDNISolone (SOLU-MEDROL) injection  60 mg Intravenous Q12H  . pantoprazole  40 mg Oral Daily  . pravastatin  10 mg Oral q1800  . sodium chloride flush  10-40 mL Intracatheter Q12H  . vancomycin   1,000 mg Intravenous Q24H   Continuous Infusions: . sodium chloride 1,000 mL (03/17/15 0056)       Time spent: 35 minutes    [facility] A  [facility] Pager [phone] If 7PM-7AM, please contact night-coverage at [email or web address], password [facility] 03/17/2015, 12:12 PM  LOS: 1 day

## 2015-03-17 NOTE — Progress Notes (Signed)
Utilization review completed.  

## 2015-03-18 DIAGNOSIS — E43 Unspecified severe protein-calorie malnutrition: Secondary | ICD-10-CM

## 2015-03-18 LAB — URINE CULTURE: CULTURE: NO GROWTH

## 2015-03-18 LAB — CBC
HEMATOCRIT: 26.3 % — AB (ref 39.0–52.0)
HEMOGLOBIN: 8.6 g/dL — AB (ref 13.0–17.0)
MCH: 26.5 pg (ref 26.0–34.0)
MCHC: 32.7 g/dL (ref 30.0–36.0)
MCV: 81.2 fL (ref 78.0–100.0)
Platelets: 293 10*3/uL (ref 150–400)
RBC: 3.24 MIL/uL — ABNORMAL LOW (ref 4.22–5.81)
RDW: 18.8 % — ABNORMAL HIGH (ref 11.5–15.5)
WBC: 22.5 10*3/uL — ABNORMAL HIGH (ref 4.0–10.5)

## 2015-03-18 LAB — BASIC METABOLIC PANEL
Anion gap: 8 (ref 5–15)
BUN: 40 mg/dL — ABNORMAL HIGH (ref 6–20)
CALCIUM: 8.5 mg/dL — AB (ref 8.9–10.3)
CHLORIDE: 106 mmol/L (ref 101–111)
CO2: 20 mmol/L — AB (ref 22–32)
Creatinine, Ser: 1.45 mg/dL — ABNORMAL HIGH (ref 0.61–1.24)
GFR calc Af Amer: 53 mL/min — ABNORMAL LOW (ref >60)
GFR calc non Af Amer: 46 mL/min — ABNORMAL LOW (ref >60)
GLUCOSE: 118 mg/dL — AB (ref 65–99)
POTASSIUM: 4.4 mmol/L (ref 3.5–5.1)
Sodium: 134 mmol/L — ABNORMAL LOW (ref 135–145)

## 2015-03-18 MED ORDER — ZOLPIDEM TARTRATE 5 MG PO TABS
5.0000 mg | ORAL_TABLET | Freq: Once | ORAL | Status: AC
  Administered 2015-03-18: 5 mg via ORAL
  Filled 2015-03-18: qty 1

## 2015-03-18 MED ORDER — BOOST / RESOURCE BREEZE PO LIQD
1.0000 | Freq: Three times a day (TID) | ORAL | Status: DC
  Administered 2015-03-18 – 2015-03-22 (×5): 1 via ORAL

## 2015-03-18 NOTE — Progress Notes (Addendum)
[facility] PROGRESS NOTE   KAELEM BRACH BDZ:[id number] DOB: 12-11-1940 DOA: 03/16/2015 PCP: Cathlean Cower, MD  HPI/Subjective: Still complaining about itchy and raw throat. Denies any fever or chills, denies any chest pain or shortness of breath.  Assessment/Plan: Principal Problem:   Acute on chronic respiratory failure with hypoxia (HCC) Active Problems:   HLD (hyperlipidemia)   SICKLE CELL TRAIT   Essential hypertension   Coronary atherosclerosis   COPD exacerbation (HCC)   GERD (gastroesophageal reflux disease)   BPH (benign prostatic hyperplasia)   Sepsis (HCC)   Lung cancer (HCC)   Protein-calorie malnutrition, severe   CKD (chronic kidney disease), stage III   Acute on chronic respiratory failure with hypoxia Likely due to COPD exacerbation. CT angiogram of chest did not show pulmonary embolism or infiltration.  Patient has wheezing bilaterally, indicating COPD exacerbation. Continue oxygen supplementation as needed.  Acute COPD exacerbation Started on IV antibiotics and IV steroids. Continue supportive management with bronchodilators, mucolytics, antitussives and oxygen as needed. Likely this is has laryngotracheitis component as patient complains about a lot of throat pain.  Sepsis due to COPD exacerbation:  Patient is septic with leukocytosis, fever, tachycardia and elevated lactate.  His lactate is normalized after treated with IV fluids in the emergency room.  Lactate 2.01-->1.62. Currently hemodynamically stable. -Procalcitonin 0.24 continue antibiotics.  Lung Cancer: being treated by Dr. Vena Austria in [facility], last chemotherapy was on 03/13/15. 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 Baseline creatinine 1.4-1.8, his creatinine is 1.55, BUN  37. Follow-up by BMP  CAD: no CP. -continue ASA and pravastatin  GERD: -Protonix  BPH: stable - Continue Flomax  Recently treated diarrhea: pt was recently hospitalized from a 1/2-1/12/17. During that admission, patient was treated for diarrhea with Flagyl for 7 days. Patient had negative C. difficile test. He was not discharged on Flagyl, but for unclear reason, patient was taking Flagyl at home for a while and then stopped taking it 10 days ago. Patient does not have abdominal pain or diarrhea. No nausea vomiting. -Started on Florastor  Leukocytosis Significant leukocytosis WBC was 26.6 on admission, today is 22.5. Unclear if he received Neulasta, he does not remember any subq injections after chemotherapy he had Tuesday before last.  Code Status: DNR Family Communication: Plan discussed with the patient. Disposition Plan: Evaluate for discharge in the next 1-2 days. Diet: Diet Heart Room service appropriate?: Yes; Fluid consistency:: Thin  Consultants:  None  Procedures:  None  Antibiotics:  None   Objective: Filed Vitals:   03/17/15 2100 03/18/15 0600  BP: 158/87 156/84  Pulse: 100 98  Temp: 97.3 F (36.3 C) 98 F (36.7 C)  Resp: 18 18    Intake/Output Summary (Last 24 hours) at 03/18/15 1138 Last data filed at 03/18/15 1033  Gross per 24 hour  Intake 3081.67 ml  Output   1300 ml  Net 1781.67 ml   Filed Weights   03/16/15 1612 03/16/15 2247  Weight: 70.308 kg (155 lb) 66.906 kg (147 lb 8 oz)    Exam: General: Alert and awake, oriented x3, not in any acute distress. HEENT: anicteric sclera, pupils reactive to light and accommodation, EOMI CVS: S1-S2 clear, no murmur rubs or gallops Chest: clear to auscultation bilaterally, no wheezing, rales or rhonchi Abdomen: soft nontender, nondistended, normal bowel sounds, no organomegaly Extremities: no cyanosis,  clubbing or edema noted bilaterally Neuro: Cranial nerves II-XII intact, no focal neurological  deficits  Data Reviewed: Basic Metabolic Panel:  Recent Labs Lab 03/16/15 1548 03/18/15 0401  NA 137 134*  K 4.1 4.4  CL 100* 106  CO2 24 20*  GLUCOSE 97 118*  BUN 37* 40*  CREATININE 1.55* 1.45*  CALCIUM 8.9 8.5*   Liver Function Tests:  Recent Labs Lab 03/16/15 1548  AST 52*  ALT 34  ALKPHOS 98  BILITOT 0.5  PROT 7.9  ALBUMIN 2.8*   No results for input(s): LIPASE, AMYLASE in the last 168 hours. No results for input(s): AMMONIA in the last 168 hours. CBC:  Recent Labs Lab 03/16/15 1548 03/18/15 0401  WBC 26.6* 22.5*  NEUTROABS 18.3*  --   HGB 10.7* 8.6*  HCT 32.3* 26.3*  MCV 80.3 81.2  PLT 397 293   Cardiac Enzymes: No results for input(s): CKTOTAL, CKMB, CKMBINDEX, TROPONINI in the last 168 hours. BNP (last 3 results) No results for input(s): BNP in the last 8760 hours.  ProBNP (last 3 results) No results for input(s): PROBNP in the last 8760 hours.  CBG: No results for input(s): GLUCAP in the last 168 hours.  Micro Recent Results (from the past 240 hour(s))  Urine culture     Status: None (Preliminary result)   Collection Time: 03/16/15  3:39 PM  Result Value Ref Range Status   Specimen Description URINE, CLEAN CATCH  Final   Special Requests NONE  Final   Culture   Final    NO GROWTH < 24 HOURS Performed at [facility]    Report Status PENDING  Incomplete  Blood Culture (routine x 2)     Status: None (Preliminary result)   Collection Time: 03/16/15  3:47 PM  Result Value Ref Range Status   Specimen Description BLOOD PORTA CATH  Final   Special Requests BOTTLES DRAWN AEROBIC AND ANAEROBIC 5ML  Final   Culture   Final    NO GROWTH < 24 HOURS Performed at [facility]    Report Status PENDING  Incomplete  Blood Culture (routine x 2)     Status: None (Preliminary result)   Collection Time: 03/16/15  4:05 PM  Result Value Ref Range Status   Specimen Description BLOOD LEFT HAND  Final   Special Requests IN PEDIATRIC  BOTTLE 3 CC  Final   Culture   Final    NO GROWTH < 24 HOURS Performed at [facility]    Report Status PENDING  Incomplete  MRSA PCR Screening     Status: None   Collection Time: 03/16/15 11:13 PM  Result Value Ref Range Status   MRSA by PCR NEGATIVE NEGATIVE Final    Comment:        The GeneXpert MRSA Assay (FDA approved for NASAL specimens only), is one component of a comprehensive MRSA colonization surveillance program. It is not intended to diagnose MRSA infection nor to guide or monitor treatment for MRSA infections.      Studies: Dg Chest 2 View  03/16/2015  CLINICAL DATA:  Shortness of breath, chills today. History of lung cancer and COPD. Last chemotherapy 4 days ago. EXAM: CHEST  2 VIEW COMPARISON:  01/26/2015 FINDINGS: Right Port-A-Cath remains in place, unchanged. Bony destruction within the left second rib with associated soft tissue mass again noted, likely not significantly changed. Vague opacity projecting over the right upper lobe near the porta catheter may reflect a soft tissue mass in the region  of the anterior right third rib, also unchanged since prior study. Mild hyperinflation of the lungs. Heart is normal size. No effusions. IMPRESSION: Stable bony destruction and soft tissue mass involving the anterior left second rib. Possible anterior right third rib and soft tissue mass, stable. Mild hyperinflation/COPD. Electronically Signed   By: Rolm Baptise M.D.   On: 03/16/2015 14:43   Ct Angio Chest Pe W/cm &/or Wo Cm  03/16/2015  CLINICAL DATA:  Shortness of Breath EXAM: CT ANGIOGRAPHY CHEST WITH CONTRAST TECHNIQUE: Multidetector CT imaging of the chest was performed using the standard protocol during bolus administration of intravenous contrast. Multiplanar CT image reconstructions and MIPs were obtained to evaluate the vascular anatomy. CONTRAST:  160m OMNIPAQUE IOHEXOL 350 MG/ML SOLN COMPARISON:  Plain film from earlier in the same day, 01/26/2015 FINDINGS:  Lungs are well aerated bilaterally. Emphysematous changes are again seen. No acute infiltrate or sizable effusion is noted. The thoracic inlet is within normal limits. There are multiple mediastinal lymph nodes identified. 2.2 cm node is noted adjacent to the origins of the left subclavian and left common carotid artery. A 1.7 cm lymph node is noted adjacent to the origin of the right subclavian artery. Conglomeration of lymph nodes is noted in the aortic or pulmonary window. This measures approximately 5.0 x 5.2 cm. Precarinal and subcarinal adenopathy is noted as well. Scattered hilar lymph nodes are noted. The largest of these is mid noted on the right measuring 2.1 cm in short axis. The thoracic aorta shows calcifications although no findings of dissection or aneurysmal dilatation are seen. The pulmonary artery is well visualized and mildly impinged centrally and on the left due to the lymphadenopathy. No filling defects to suggest pulmonary emboli are noted. The visualized upper abdomen demonstrates metastatic disease within the left adrenal gland similar to that seen on prior exam. The bony structures show degenerative change of the thoracic spine. Additionally there are expansile lesions within the rib cage bilaterally involving primarily the right third rib anteriorly as well of the left second rib. Expansile lesion of the right fifth rib is noted as well. Destructive lesion of the mid sternum is noted as well. These changes are consistent with metastatic disease. Right chest wall port is again noted. Review of the MIP images confirms the above findings. IMPRESSION: Multiple bony metastatic lesions involving the rib cage bilaterally as well as the sternum. Diffuse hilar and mediastinal adenopathy consistent with the patient's given clinical history of lung carcinoma. This contributes to some degree of compression on the pulmonary artery centrally and eccentric to the left. Left adrenal metastatic disease No  evidence of pulmonary emboli. Chronic changes in the lungs bilaterally. Although not mentioned in the body of the report there is significant left axillary and chest wall lymphadenopathy identified. Electronically Signed   By: MInez CatalinaM.D.   On: 03/16/2015 18:39    Scheduled Meds: . aspirin  81 mg Oral Daily  . ceFEPime (MAXIPIME) IV  2 g Intravenous Q24H  . enoxaparin (LOVENOX) injection  40 mg Subcutaneous Q24H  . feeding supplement (ENSURE ENLIVE)  237 mL Oral BID BM  . guaiFENesin  1,200 mg Oral BID  . methylPREDNISolone (SOLU-MEDROL) injection  60 mg Intravenous Q12H  . pantoprazole  40 mg Oral Daily  . pravastatin  10 mg Oral q1800  . sodium chloride flush  10-40 mL Intracatheter Q12H  . vancomycin  1,000 mg Intravenous Q24H   Continuous Infusions: . sodium chloride 1,000 mL (03/17/15 1341)  Time spent: 35 minutes    [facility] A  [facility] Pager [phone] If 7PM-7AM, please contact night-coverage at [email or web address], password [facility] 03/18/2015, 11:38 AM  LOS: 2 days

## 2015-03-18 NOTE — Evaluation (Signed)
Occupational Therapy Evaluation Patient Details Name: Alan Franco MRN: [id number] DOB: 1940-06-17 Today's Date: 03/18/2015    History of Present Illness [name] is a 75 y.o. male with PMH of lung cancer, hyperlipidemia, COPD, GERD, gout, alcohol abuse, carotid artery stenosis, CAD, HCV, PAD, chronic kidney disease-, who presents with a cough and shortness of breath.  CTA showed no PE or infiltration, but showed multiple bony metastatic lesions involving the rib cage and sternum, diffuse hilar and mediastinal adenopathy consistent with the patient's given clinical history of lung carcinoma. This contributes to some degree of compression on the pulmonary artery centrally and Left adrenal metastatic disease.    Clinical Impression   Pt. Is at baseline for ADLs and is setup for tasks. Pt. Is Mod I with AMB into bathroom and pushing IV pole. Pt. Is I with bed mobility. Pt. Is able to stand at sink for grooming for 5 min. Pt. States he is at baseline performance for ADLs and mobility. Pt. Does not require further OT services at this time.     Follow Up Recommendations  No OT follow up    Equipment Recommendations  None recommended by OT    Recommendations for Other Services       Precautions / Restrictions Precautions Precautions: Fall Precaution Comments: h/o acetab mets in 1/17 Restrictions Weight Bearing Restrictions: No Other Position/Activity Restrictions: no WB restrictions last admit      Mobility Bed Mobility Overal bed mobility: Independent                Transfers Overall transfer level: Modified independent               General transfer comment: Pt. is Mod I with  AMB to bathroom with IV pole.    Balance                                            ADL Overall ADL's : At baseline                                       General ADL Comments:  (Pt. is at baseline of setup for ADLs and S with mobility. )      Vision Vision Assessment?: No apparent visual deficits   Perception     Praxis      Pertinent Vitals/Pain Pain Assessment: No/denies pain     Hand Dominance Right   Extremity/Trunk Assessment Upper Extremity Assessment Upper Extremity Assessment: Overall WFL for tasks assessed           Communication Communication Communication: No difficulties (voice is vdry raspy )   Cognition Arousal/Alertness: Awake/alert Behavior During Therapy: WFL for tasks assessed/performed Overall Cognitive Status: Within Functional Limits for tasks assessed                     General Comments       Exercises       Shoulder Instructions      Home Living Family/patient expects to be discharged to:: Private residence Living Arrangements: Spouse/significant other Available Help at Discharge: Family Type of Home: House Home Access: Stairs to enter Technical brewer of Steps: 3   Home Layout: One level     Bathroom Shower/Tub: Tub/shower unit Shower/tub characteristics: Architectural technologist: Standard  Bathroom Accessibility: Yes How Accessible: Accessible via wheelchair Home Equipment: [place] - 2 wheels;Tub bench;Toilet riser;Grab bars - toilet;Grab bars - tub/shower;Hand held shower head;Wheelchair - Education officer, community - power          Prior Functioning/Environment Level of Independence: Needs assistance  Gait / Transfers Assistance Needed: ambulatory for short distances. with RW/opower chair ADL's / Homemaking Assistance Needed:  (Pt. states he was performing ADLs at Mod I level.)        OT Diagnosis:     OT Problem List:     OT Treatment/Interventions:      OT Goals(Current goals can be found in the care plan section) Acute Rehab OT Goals Patient Stated Goal:  (To go home.)  OT Frequency:     Barriers to D/C:            Co-evaluation              End of Session    Activity Tolerance: Patient tolerated treatment well Patient left:      Time: 6295-2841 OT Time Calculation (min): 41 min Charges:  OT General Charges $OT Visit: 1 Procedure OT Evaluation $OT Eval Moderate Complexity: 1 Procedure OT Treatments $Self Care/Home Management : 8-22 mins G-Codes:    Arieanna Pressey Apr 04, 2015, 10:24 AM

## 2015-03-18 NOTE — Progress Notes (Signed)
Initial Nutrition Assessment  INTERVENTION:   -Continue Ensure Enlive po BID, each supplement provides 350 kcal and 20 grams of protein -Provide Boost Breeze po TID, each supplement provides 250 kcal and 9 grams of protein -RD to follow-up to complete assessment  NUTRITION DIAGNOSIS:   Increased nutrient needs related to cancer and cancer related treatments as evidenced by estimated needs.  GOAL:   Patient will meet greater than or equal to 90% of their needs  MONITOR:   PO intake, Supplement acceptance, Labs, Weight trends, I & O's  REASON FOR ASSESSMENT:   Consult COPD Protocol  ASSESSMENT:   75 y.o. male with PMH of lung cancer (being treated by Dr. Vena Austria in [place], last chemotherapy was on 03/13/15), hyperlipidemia, COPD, GERD, gout, alcohol abuse (quit on 12/17/1989), carotid artery stenosis, CAD, HCV, PAD, chronic kidney disease-stage III, BPH, who presents with a cough and shortness of breath.  RD attempted to see patient x 2, once when staff in room and another when patient had visitors in room. Patient stated he was having a prayer session at that time. RD to attempt to speak with patient at a later time.  Per chart review, pt last received chemotherapy for lung cancer with brain mets 2/21. Pt was seen by RD during admission in January. He was then diagnosed with severe malnutrition and ordered Boost Breeze supplements. Suspect pt's malnutrition continues given continued weight loss of 10 lb since 1/06 (6% wt loss x 2 months, significant for time frame). RD to order Boost Breeze supplements and will assess tolerance and acceptance at follow-up. PO intake: 50% this am of Heart Healthy diet. 100% meal completion yesterday. Pt reports in MD notes of raw throat.  Will attempt NFPE at follow-up.   Medications reviewed. Labs reviewed: Low Na  Diet Order:  Diet Heart Room service appropriate?: Yes; Fluid consistency:: Thin  Skin:  Reviewed, no issues  Last BM:  2/26  Height:    Ht Readings from Last 1 Encounters:  03/16/15 '6\' 1"'$  (1.854 m)    Weight:   Wt Readings from Last 1 Encounters:  03/16/15 147 lb 8 oz (66.906 kg)    Ideal Body Weight:  83.6 kg  BMI:  Body mass index is 19.46 kg/(m^2).  Estimated Nutritional Needs:   Kcal:  2100-2300  Protein:  100-110g  Fluid:  2.1L/day  EDUCATION NEEDS:   No education needs identified at this time  Clayton Bibles, MS, RD, LDN Pager: [phone] After Hours Pager: [phone]

## 2015-03-18 NOTE — Progress Notes (Signed)
Pharmacy Antibiotic Note  Alan Franco is a 75 y.o. male admitted on 03/16/2015 with pneumonia.   He has history of Lung cancer and was recently admitted in Jan 2017 with sepsis/ infectious diarrhea.   Pharmacy has been consulted for Vancomycin & Cefepime dosing. Renal function at patient's baseline.  Estimated CrCl ~ 62m/min.   Day #3 antibiotics  Plan:  Consider d/c vancomycin based on negative MRSA PCR and unrevealing blood and urine cultures  Vancomycin 1gm IV q24h remains appropriate dose at this time  Continue Cefepime 2gm IV q24h  Height: '6\' 1"'$  (185.4 cm) Weight: 147 lb 8 oz (66.906 kg) IBW/kg (Calculated) : 79.9  Temp (24hrs), Avg:97.8 F (36.6 C), Min:97.3 F (36.3 C), Max:98 F (36.7 C)   Recent Labs Lab 03/16/15 1548 03/16/15 1557 03/16/15 1844 03/18/15 0401  WBC 26.6*  --   --  22.5*  CREATININE 1.55*  --   --  1.45*  LATICACIDVEN  --  2.01* 1.62  --     Estimated Creatinine Clearance: 41.7 mL/min (by C-G formula based on Cr of 1.45).    No Known Allergies  Antimicrobials this admission: 2/24 Cefepime >>  2/24 Vanc >>   Dose adjustments this admission:  Microbiology results: 2/24 BCx: NG < 24h 2/24 UCx: NG 2/24 MRSA PCR: neg 2/24 resp virus panel: pending 2/24 flu PCR: neg 2/24 Strep Ag: neg  Thank you for allowing pharmacy to be a part of this patient's care.  DDoreene Eland PharmD, BCPS.   Pager: [phone]/26/2017 1:09 PM

## 2015-03-19 LAB — BASIC METABOLIC PANEL
Anion gap: 10 (ref 5–15)
BUN: 40 mg/dL — AB (ref 6–20)
CALCIUM: 8.7 mg/dL — AB (ref 8.9–10.3)
CO2: 21 mmol/L — ABNORMAL LOW (ref 22–32)
Chloride: 106 mmol/L (ref 101–111)
Creatinine, Ser: 1.32 mg/dL — ABNORMAL HIGH (ref 0.61–1.24)
GFR calc Af Amer: 59 mL/min — ABNORMAL LOW (ref >60)
GFR, EST NON AFRICAN AMERICAN: 51 mL/min — AB (ref >60)
Glucose, Bld: 141 mg/dL — ABNORMAL HIGH (ref 65–99)
POTASSIUM: 4.3 mmol/L (ref 3.5–5.1)
SODIUM: 137 mmol/L (ref 135–145)

## 2015-03-19 MED ORDER — ZOLPIDEM TARTRATE 5 MG PO TABS
5.0000 mg | ORAL_TABLET | Freq: Every evening | ORAL | Status: DC | PRN
  Administered 2015-03-19: 5 mg via ORAL
  Filled 2015-03-19 (×2): qty 1

## 2015-03-19 MED ORDER — TRAMADOL HCL 50 MG PO TABS
50.0000 mg | ORAL_TABLET | Freq: Four times a day (QID) | ORAL | Status: AC | PRN
  Administered 2015-03-19 – 2015-03-21 (×2): 50 mg via ORAL
  Filled 2015-03-19 (×2): qty 1

## 2015-03-19 NOTE — Progress Notes (Signed)
PT Cancellation Note  Patient Details Name: Alan Franco MRN: [id number] DOB: 08-Jan-1941   Cancelled Treatment:     pt currently amb in hallway with RN pushing IV poll.   Nathanial Rancher 03/19/2015, 3:43 PM

## 2015-03-19 NOTE — Progress Notes (Signed)
[facility] PROGRESS NOTE   Alan Franco KKX:[id number] DOB: Jun 15, 1940 DOA: 03/16/2015 PCP: Cathlean Cower, MD  HPI/Subjective: Feeling better, complaining of difficulty swallowing solids, and throat pain,  Assessment/Plan: Principal Problem:   Acute on chronic respiratory failure with hypoxia ([place]) Active Problems:   HLD (hyperlipidemia)   SICKLE CELL TRAIT   Essential hypertension   Coronary atherosclerosis   COPD exacerbation (HCC)   GERD (gastroesophageal reflux disease)   BPH (benign prostatic hyperplasia)   Sepsis (HCC)   Lung cancer ([place])   Protein-calorie malnutrition, severe   CKD (chronic kidney disease), stage III   Acute on chronic respiratory failure with hypoxia Likely due to COPD exacerbation. CT angiogram of chest did not show pulmonary embolism or infiltration.  Patient has wheezing bilaterally, indicating COPD exacerbation. Continue oxygen supplementation as needed.  Acute COPD exacerbation Started on IV antibiotics and IV steroids. Continue supportive management with bronchodilators, mucolytics, antitussives and oxygen as needed. Likely this is has laryngotracheitis component as patient complains about a lot of throat pain.  Dysphagia;  Report pain, difficulty swallowing solids.  Check Esophagogram.  Speech evaluation.   Sepsis  Patient is septic with leukocytosis, fever, tachycardia and elevated lactate.  His lactate is normalized after treated with IV fluids in the emergency room.  Lactate 2.01-->1.62. Currently hemodynamically stable. -Procalcitonin 0.24 continue antibiotics.  Lung Cancer: being treated by Dr. Vena Austria in [facility], last chemotherapy was on 03/13/15. -f/u with primary oncologist in [facility] -Patient has brain metastases, has had, knife surgery on 03/15/2015  HLD: Last LDL was 103 -Continue home medications: Pravachol  Protein-calorie malnutrition, severe: -Ensure  CKD-III: Stable. Baseline creatinine 1.4-1.8, his  creatinine is 1.55, BUN 37. Follow-up by [facility], will NSL .   CAD: no CP. -continue ASA and pravastatin  GERD: -Protonix  BPH: stable - Continue Flomax  Recently treated diarrhea: pt was recently hospitalized from a 1/2-1/12/17. During that admission, patient was treated for diarrhea with Flagyl for 7 days. Patient had negative C. difficile test. He was not discharged on Flagyl, but for unclear reason, patient was taking Flagyl at home for a while and then stopped taking it 10 days ago. Patient does not have abdominal pain or diarrhea. No nausea vomiting. -Started on Florastor -denies diarrhea.   Leukocytosis Significant leukocytosis WBC was 26.6 on admission, today is 22.5. Unclear if he received Neulasta, he does not remember any subq injections after chemotherapy he had Tuesday before last. Repeat labs in am.  Also patient on steroids.   Code Status: DNR Family Communication: Plan discussed with the patient. Disposition Plan: Evaluate for discharge in the next 1-2 days. Diet: Diet Heart Room service appropriate?: Yes; Fluid consistency:: Thin  Consultants:  None  Procedures:  None  Antibiotics:  Cefepime 2-25     Objective: Filed Vitals:   03/18/15 2339 03/19/15 0504  BP: 166/95 167/97  Pulse: 95 102  Temp: 98.2 F (36.8 C) 98.1 F (36.7 C)  Resp: 20 18    Intake/Output Summary (Last 24 hours) at 03/19/15 1219 Last data filed at 03/19/15 0504  Gross per 24 hour  Intake   2227 ml  Output   1650 ml  Net    577 ml   Filed Weights   03/16/15 1612 03/16/15 2247  Weight: 70.308 kg (155 lb) 66.906 kg (147 lb 8 oz)    Exam: General: Alert and awake, oriented x3, not in any acute distress. HEENT: anicteric sclera, pupils reactive to light and accommodation, EOMI CVS: S1-S2 clear,  no murmur rubs or gallops Chest: clear to auscultation bilaterally, no wheezing, rales or rhonchi Abdomen: soft nontender, nondistended, normal bowel sounds, no  organomegaly Extremities: no cyanosis, clubbing or edema noted bilaterally   Data Reviewed: Basic Metabolic Panel:  Recent Labs Lab 03/16/15 1548 03/18/15 0401 03/19/15 0500  NA 137 134* 137  K 4.1 4.4 4.3  CL 100* 106 106  CO2 24 20* 21*  GLUCOSE 97 118* 141*  BUN 37* 40* 40*  CREATININE 1.55* 1.45* 1.32*  CALCIUM 8.9 8.5* 8.7*   Liver Function Tests:  Recent Labs Lab 03/16/15 1548  AST 52*  ALT 34  ALKPHOS 98  BILITOT 0.5  PROT 7.9  ALBUMIN 2.8*   No results for input(s): LIPASE, AMYLASE in the last 168 hours. No results for input(s): AMMONIA in the last 168 hours. CBC:  Recent Labs Lab 03/16/15 1548 03/18/15 0401  WBC 26.6* 22.5*  NEUTROABS 18.3*  --   HGB 10.7* 8.6*  HCT 32.3* 26.3*  MCV 80.3 81.2  PLT 397 293   Cardiac Enzymes: No results for input(s): CKTOTAL, CKMB, CKMBINDEX, TROPONINI in the last 168 hours. BNP (last 3 results) No results for input(s): BNP in the last 8760 hours.  ProBNP (last 3 results) No results for input(s): PROBNP in the last 8760 hours.  CBG: No results for input(s): GLUCAP in the last 168 hours.  Micro Recent Results (from the past 240 hour(s))  Urine culture     Status: None   Collection Time: 03/16/15  3:39 PM  Result Value Ref Range Status   Specimen Description URINE, CLEAN CATCH  Final   Special Requests NONE  Final   Culture   Final    NO GROWTH 2 DAYS Performed at [facility]    Report Status 03/18/2015 FINAL  Final  Blood Culture (routine x 2)     Status: None (Preliminary result)   Collection Time: 03/16/15  3:47 PM  Result Value Ref Range Status   Specimen Description BLOOD PORTA CATH  Final   Special Requests BOTTLES DRAWN AEROBIC AND ANAEROBIC 5ML  Final   Culture   Final    NO GROWTH 2 DAYS Performed at [facility]    Report Status PENDING  Incomplete  Blood Culture (routine x 2)     Status: None (Preliminary result)   Collection Time: 03/16/15  4:05 PM  Result Value Ref  Range Status   Specimen Description BLOOD LEFT HAND  Final   Special Requests IN PEDIATRIC BOTTLE 3 CC  Final   Culture   Final    NO GROWTH 2 DAYS Performed at [facility]    Report Status PENDING  Incomplete  MRSA PCR Screening     Status: None   Collection Time: 03/16/15 11:13 PM  Result Value Ref Range Status   MRSA by PCR NEGATIVE NEGATIVE Final    Comment:        The GeneXpert MRSA Assay (FDA approved for NASAL specimens only), is one component of a comprehensive MRSA colonization surveillance program. It is not intended to diagnose MRSA infection nor to guide or monitor treatment for MRSA infections.      Studies: No results found.  Scheduled Meds: . aspirin  81 mg Oral Daily  . ceFEPime (MAXIPIME) IV  2 g Intravenous Q24H  . enoxaparin (LOVENOX) injection  40 mg Subcutaneous Q24H  . feeding supplement  1 Container Oral TID WC  . feeding supplement (ENSURE ENLIVE)  237 mL Oral BID BM  .  guaiFENesin  1,200 mg Oral BID  . methylPREDNISolone (SOLU-MEDROL) injection  60 mg Intravenous Q12H  . pantoprazole  40 mg Oral Daily  . pravastatin  10 mg Oral q1800  . sodium chloride flush  10-40 mL Intracatheter Q12H   Continuous Infusions: . sodium chloride 1,000 mL (03/19/15 0418)       Time spent: 35 minutes    Teirra Carapia A  [facility] Pager [phone] 7PM-7AM, please contact night-coverage at [email or web address], password [facility] 03/19/2015, 12:19 PM  LOS: 3 days

## 2015-03-20 ENCOUNTER — Inpatient Hospital Stay (HOSPITAL_COMMUNITY): Payer: Medicare HMO

## 2015-03-20 ENCOUNTER — Encounter (HOSPITAL_COMMUNITY): Payer: Self-pay | Admitting: Radiology

## 2015-03-20 DIAGNOSIS — I2510 Atherosclerotic heart disease of native coronary artery without angina pectoris: Secondary | ICD-10-CM

## 2015-03-20 DIAGNOSIS — I2583 Coronary atherosclerosis due to lipid rich plaque: Secondary | ICD-10-CM

## 2015-03-20 DIAGNOSIS — R1310 Dysphagia, unspecified: Secondary | ICD-10-CM | POA: Insufficient documentation

## 2015-03-20 LAB — BASIC METABOLIC PANEL
Anion gap: 10 (ref 5–15)
BUN: 48 mg/dL — AB (ref 6–20)
CHLORIDE: 104 mmol/L (ref 101–111)
CO2: 23 mmol/L (ref 22–32)
CREATININE: 1.46 mg/dL — AB (ref 0.61–1.24)
Calcium: 8.9 mg/dL (ref 8.9–10.3)
GFR, EST AFRICAN AMERICAN: 52 mL/min — AB (ref >60)
GFR, EST NON AFRICAN AMERICAN: 45 mL/min — AB (ref >60)
Glucose, Bld: 118 mg/dL — ABNORMAL HIGH (ref 65–99)
POTASSIUM: 4.3 mmol/L (ref 3.5–5.1)
SODIUM: 137 mmol/L (ref 135–145)

## 2015-03-20 LAB — CBC
HCT: 29.5 % — ABNORMAL LOW (ref 39.0–52.0)
Hemoglobin: 9.4 g/dL — ABNORMAL LOW (ref 13.0–17.0)
MCH: 25.8 pg — ABNORMAL LOW (ref 26.0–34.0)
MCHC: 31.9 g/dL (ref 30.0–36.0)
MCV: 80.8 fL (ref 78.0–100.0)
Platelets: 307 10*3/uL (ref 150–400)
RBC: 3.65 MIL/uL — ABNORMAL LOW (ref 4.22–5.81)
RDW: 18.8 % — ABNORMAL HIGH (ref 11.5–15.5)
WBC: 27.3 10*3/uL — ABNORMAL HIGH (ref 4.0–10.5)

## 2015-03-20 LAB — RESPIRATORY VIRUS PANEL
ADENOVIRUS: NEGATIVE
INFLUENZA A: NEGATIVE
Influenza B: NEGATIVE
Metapneumovirus: NEGATIVE
PARAINFLUENZA 2 A: NEGATIVE
Parainfluenza 1: NEGATIVE
Parainfluenza 3: NEGATIVE
RESPIRATORY SYNCYTIAL VIRUS B: NEGATIVE
RHINOVIRUS: POSITIVE — AB
Respiratory Syncytial Virus A: NEGATIVE

## 2015-03-20 MED ORDER — LEVOFLOXACIN 750 MG PO TABS
750.0000 mg | ORAL_TABLET | ORAL | Status: DC
  Administered 2015-03-20: 750 mg via ORAL
  Filled 2015-03-20: qty 1

## 2015-03-20 MED ORDER — SODIUM CHLORIDE 0.9 % IV SOLN
INTRAVENOUS | Status: AC
  Administered 2015-03-21 (×2): via INTRAVENOUS

## 2015-03-20 MED ORDER — FLUCONAZOLE IN SODIUM CHLORIDE 200-0.9 MG/100ML-% IV SOLN
200.0000 mg | INTRAVENOUS | Status: DC
  Administered 2015-03-20 – 2015-03-22 (×3): 200 mg via INTRAVENOUS
  Filled 2015-03-20 (×3): qty 100

## 2015-03-20 MED ORDER — PREDNISONE 50 MG PO TABS
50.0000 mg | ORAL_TABLET | Freq: Every day | ORAL | Status: DC
  Administered 2015-03-20 – 2015-03-22 (×3): 50 mg via ORAL
  Filled 2015-03-20 (×3): qty 1

## 2015-03-20 MED ORDER — IOHEXOL 300 MG/ML  SOLN
75.0000 mL | Freq: Once | INTRAMUSCULAR | Status: AC | PRN
Start: 2015-03-20 — End: 2015-03-20
  Administered 2015-03-20: 75 mL via INTRAVENOUS

## 2015-03-20 NOTE — Progress Notes (Signed)
During bedside report, pt noted to be asleep. Breathing WNL, no dyspnea.

## 2015-03-20 NOTE — Progress Notes (Addendum)
[facility] PROGRESS NOTE   DETRICH RAKESTRAW CVE:[id number] DOB: [birth year] DOA: 03/16/2015 PCP: Cathlean Cower, MD  HPI/Subjective:  patient still complains of difficulty swallowing, no chest pain or shortness of breath, resting comfortably, afebrile overnight  Assessment/Plan: Principal Problem:   Acute on chronic respiratory failure with hypoxia (HCC) Active Problems:   HLD (hyperlipidemia)   SICKLE CELL TRAIT   Essential hypertension   Coronary atherosclerosis   COPD exacerbation (HCC)   GERD (gastroesophageal reflux disease)   BPH (benign prostatic hyperplasia)   Sepsis (HCC)   Lung cancer ([place])   Protein-calorie malnutrition, severe   CKD (chronic kidney disease), stage III   Dysphagia    Assessment and plan Acute on chronic respiratory failure with hypoxia Likely due to COPD exacerbation. CT angiogram of chest did not show pulmonary embolism or infiltration.  Patient has wheezing bilaterally, indicating COPD exacerbation. Continue oxygen supplementation as needed. Taper steroids   persistent leukocytosis likely secondary to steroids, doubt infection  Acute COPD exacerbation Patient currently on IV antibiotics and IV steroids. DC cefepime and switch to Levaquin Continue supportive management with bronchodilators, mucolytics, antitussives and oxygen as needed. Likely this is has laryngotracheitis component as patient complains about a lot of throat pain.  Dysphagia;  Report pain, difficulty swallowing solids.  Will order CT scan of the neck to r/o extrinsic compression from his malignancy  Esophagogram.  Speech evaluation.   Sepsis  Patient is septic with leukocytosis, fever, tachycardia and elevated lactate.  His lactate is normalized after treated with IV fluids in the emergency room.  Lactate 2.01-->1.62. Currently hemodynamically stable. -Procalcitonin 0.24 continue antibiotics.  Lung Cancer: being treated by Dr. Vena Austria in [facility], last chemotherapy  was on 03/13/15. -f/u with primary oncologist in [facility] -Patient has brain metastases, has had, knife surgery on 03/15/2015  HLD: Last LDL was 103 -Continue home medications: Pravachol  Protein-calorie malnutrition, severe: -Ensure  CKD-III: Stable. Baseline creatinine 1.4-1.8, his creatinine is 1.55, BUN 37.  continue to follow renal function   CAD: no CP. -continue ASA and pravastatin  GERD: -Protonix  BPH: stable - Continue Flomax  Recently treated diarrhea: pt was recently hospitalized from a 1/2-1/12/17. During that admission, patient was treated for diarrhea with Flagyl for 7 days. Patient had negative C. difficile test. He was not discharged on Flagyl, but for unclear reason, patient was taking Flagyl at home for a while and then stopped taking it 10 days ago. Continue Florastor   Leukocytosis Significant leukocytosis WBC was 26.6 on admission, today is 22.5. Unclear if he received Neulasta, he does not remember any subq injections after chemotherapy he had Tuesday before last. Had a differential to CBC tomorrow   Code Status: DNR Family Communication: Plan discussed with the patient. Disposition Plan: Evaluate for discharge in the next 1-2 days. Diet: Diet Heart Room service appropriate?: Yes; Fluid consistency:: Thin  Consultants:  None  Procedures:  None  Antibiotics:  Cefepime 2-25     Objective: Filed Vitals:   03/19/15 2229 03/20/15 0514  BP: 167/96 168/99  Pulse: 98 102  Temp: 98.6 F (37 C) 97.9 F (36.6 C)  Resp: 20 20    Intake/Output Summary (Last 24 hours) at 03/20/15 0951 Last data filed at 03/20/15 0600  Gross per 24 hour  Intake    410 ml  Output   1700 ml  Net  -1290 ml   Filed Weights   03/16/15 1612 03/16/15 2247  Weight: 70.308 kg (155 lb) 66.906 kg (147 lb 8  oz)    Exam: General: Alert and awake, oriented x3, not in any acute distress. HEENT: anicteric sclera, pupils reactive to light and accommodation,  EOMI CVS: S1-S2 clear, no murmur rubs or gallops Chest: clear to auscultation bilaterally, no wheezing, rales or rhonchi Abdomen: soft nontender, nondistended, normal bowel sounds, no organomegaly Extremities: no cyanosis, clubbing or edema noted bilaterally   Data Reviewed: Basic Metabolic Panel:  Recent Labs Lab 03/16/15 1548 03/18/15 0401 03/19/15 0500 03/20/15 0352  NA 137 134* 137 137  K 4.1 4.4 4.3 4.3  CL 100* 106 106 104  CO2 24 20* 21* 23  GLUCOSE 97 118* 141* 118*  BUN 37* 40* 40* 48*  CREATININE 1.55* 1.45* 1.32* 1.46*  CALCIUM 8.9 8.5* 8.7* 8.9   Liver Function Tests:  Recent Labs Lab 03/16/15 1548  AST 52*  ALT 34  ALKPHOS 98  BILITOT 0.5  PROT 7.9  ALBUMIN 2.8*   No results for input(s): LIPASE, AMYLASE in the last 168 hours. No results for input(s): AMMONIA in the last 168 hours. CBC:  Recent Labs Lab 03/16/15 1548 03/18/15 0401 03/20/15 0352  WBC 26.6* 22.5* 27.3*  NEUTROABS 18.3*  --   --   HGB 10.7* 8.6* 9.4*  HCT 32.3* 26.3* 29.5*  MCV 80.3 81.2 80.8  PLT 397 293 307   Cardiac Enzymes: No results for input(s): CKTOTAL, CKMB, CKMBINDEX, TROPONINI in the last 168 hours. BNP (last 3 results) No results for input(s): BNP in the last 8760 hours.  ProBNP (last 3 results) No results for input(s): PROBNP in the last 8760 hours.  CBG: No results for input(s): GLUCAP in the last 168 hours.  Micro Recent Results (from the past 240 hour(s))  Urine culture     Status: None   Collection Time: 03/16/15  3:39 PM  Result Value Ref Range Status   Specimen Description URINE, CLEAN CATCH  Final   Special Requests NONE  Final   Culture   Final    NO GROWTH 2 DAYS Performed at [facility]    Report Status 03/18/2015 FINAL  Final  Blood Culture (routine x 2)     Status: None (Preliminary result)   Collection Time: 03/16/15  3:47 PM  Result Value Ref Range Status   Specimen Description BLOOD PORTA CATH  Final   Special Requests  BOTTLES DRAWN AEROBIC AND ANAEROBIC 5ML  Final   Culture   Final    NO GROWTH 3 DAYS Performed at [facility]    Report Status PENDING  Incomplete  Blood Culture (routine x 2)     Status: None (Preliminary result)   Collection Time: 03/16/15  4:05 PM  Result Value Ref Range Status   Specimen Description BLOOD LEFT HAND  Final   Special Requests IN PEDIATRIC BOTTLE 3 CC  Final   Culture   Final    NO GROWTH 3 DAYS Performed at [facility]    Report Status PENDING  Incomplete  MRSA PCR Screening     Status: None   Collection Time: 03/16/15 11:13 PM  Result Value Ref Range Status   MRSA by PCR NEGATIVE NEGATIVE Final    Comment:        The GeneXpert MRSA Assay (FDA approved for NASAL specimens only), is one component of a comprehensive MRSA colonization surveillance program. It is not intended to diagnose MRSA infection nor to guide or monitor treatment for MRSA infections.   Respiratory virus panel     Status: Abnormal  Collection Time: 03/17/15  9:50 PM  Result Value Ref Range Status   Respiratory Syncytial Virus A Negative Negative Final   Respiratory Syncytial Virus B Negative Negative Final   Influenza A Negative Negative Final   Influenza B Negative Negative Final   Parainfluenza 1 Negative Negative Final   Parainfluenza 2 Negative Negative Final   Parainfluenza 3 Negative Negative Final   Metapneumovirus Negative Negative Final   Rhinovirus Positive (A) Negative Final   Adenovirus Negative Negative Final    Comment: (NOTE) Performed At: [facility] [place], [place] [id number] Lindon Romp MD MB:[id number]      Studies: No results found.  Scheduled Meds: . aspirin  81 mg Oral Daily  . ceFEPime (MAXIPIME) IV  2 g Intravenous Q24H  . enoxaparin (LOVENOX) injection  40 mg Subcutaneous Q24H  . feeding supplement  1 Container Oral TID WC  . feeding supplement (ENSURE ENLIVE)  237 mL Oral BID BM  . fluconazole  (DIFLUCAN) IV  200 mg Intravenous Q24H  . guaiFENesin  1,200 mg Oral BID  . pantoprazole  40 mg Oral Daily  . pravastatin  10 mg Oral q1800  . predniSONE  50 mg Oral Q breakfast  . sodium chloride flush  10-40 mL Intracatheter Q12H   Continuous Infusions:       Time spent: 35 minutes    [facility]  [facility] Pager [phone] 7PM-7AM, please contact night-coverage at [email or web address], password [facility] 03/20/2015, 9:51 AM  LOS: 4 days

## 2015-03-20 NOTE — Progress Notes (Addendum)
Pharmacy Antibiotic Note  Alan Franco is a 75 y.o. male admitted on 03/16/2015 with pneumonia.  Today is day #5 of Cefepime and now pharmacy has been consulted for Levaquin dosing for CAP. Afebrile, WBC remains elevated, SCr remains elevated with CrCl ~ 41 ml/min.  Plan:  Levaquin '750mg'$  PO q48h  Follow up renal fxn, culture results, and clinical course.  Note, dosing antibiotic PO per MD, but pt c/o difficulty swallowing, pain, and dysphagia.  F/u is tolerating PO medications.   Height: '6\' 1"'$  (185.4 cm) Weight: 147 lb 8 oz (66.906 kg) IBW/kg (Calculated) : 79.9  Temp (24hrs), Avg:98.3 F (36.8 C), Min:97.9 F (36.6 C), Max:98.6 F (37 C)   Recent Labs Lab 03/16/15 1548 03/16/15 1557 03/16/15 1844 03/18/15 0401 03/19/15 0500 03/20/15 0352  WBC 26.6*  --   --  22.5*  --  27.3*  CREATININE 1.55*  --   --  1.45* 1.32* 1.46*  LATICACIDVEN  --  2.01* 1.62  --   --   --     Estimated Creatinine Clearance: 41.4 mL/min (by C-G formula based on Cr of 1.46).    No Known Allergies  Antimicrobials this admission: 2/24 Vanc >> 2/26 2/24 Cefepime >> 2/28 2/28 Levaquin >>  Dose adjustments this admission:  Microbiology results: 2/24 BCx: ngtd 2/24 UCx: NGF 2/24 MRSA PCR: neg 2/24 flu PCR: neg 2/24 Strep Ag: neg 2/25 resp virus panel: Rhinovirus +  Thank you for allowing pharmacy to be a part of this patient's care.  Gretta Arab PharmD, BCPS Pager [phone] 03/20/2015 10:50 AM

## 2015-03-20 NOTE — Progress Notes (Signed)
CSW received consult for COPD Gold Protocol, though patient does not meet qualifications- pt past 3 admissions have not been COPD as primary diagnosis.   Inappropriate CSW referral.  Please re-consult if social work needs arise.  CSW signing off.   Alison Murray, MSW, [place] [facility] [phone]

## 2015-03-20 NOTE — Evaluation (Addendum)
SLP Cancellation Note  Patient Details Name: Alan Franco MRN: [id number] DOB: 1940-03-04   Cancelled treatment:       Reason Eval/Treat Not Completed: Other (comment) (pt now npo for CT scan of neck with contrast after lunch per RN statement, SLP reviewed images from esophagram and noted aspiration with pharyngeal residuals, recommend MBS next am 03/21/15 to allow develop of possible compensation strategies )   Luanna Salk, [facility] SLP [phone]

## 2015-03-20 NOTE — Care Management Important Message (Signed)
Important Message  Patient Details IM Letter given to Cookie/Case Manager to present to Patient Name: Alan Franco MRN: [id number] Date of Birth: [birth year]   Medicare Important Message Given:       [name] 03/20/2015, 1:02 [place] Message  Patient Details  Name: Alan Franco MRN: [id number] Date of Birth: 1940-02-08   Medicare Important Message Given:       [name] 03/20/2015, 1:02 PM

## 2015-03-21 ENCOUNTER — Inpatient Hospital Stay (HOSPITAL_COMMUNITY): Payer: Medicare HMO

## 2015-03-21 DIAGNOSIS — R1310 Dysphagia, unspecified: Secondary | ICD-10-CM

## 2015-03-21 DIAGNOSIS — J9621 Acute and chronic respiratory failure with hypoxia: Secondary | ICD-10-CM

## 2015-03-21 DIAGNOSIS — I10 Essential (primary) hypertension: Secondary | ICD-10-CM

## 2015-03-21 DIAGNOSIS — C3490 Malignant neoplasm of unspecified part of unspecified bronchus or lung: Secondary | ICD-10-CM

## 2015-03-21 DIAGNOSIS — N183 Chronic kidney disease, stage 3 (moderate): Secondary | ICD-10-CM

## 2015-03-21 DIAGNOSIS — J441 Chronic obstructive pulmonary disease with (acute) exacerbation: Secondary | ICD-10-CM

## 2015-03-21 DIAGNOSIS — E785 Hyperlipidemia, unspecified: Secondary | ICD-10-CM

## 2015-03-21 LAB — COMPREHENSIVE METABOLIC PANEL
ALBUMIN: 2.3 g/dL — AB (ref 3.5–5.0)
ALT: 48 U/L (ref 17–63)
AST: 44 U/L — AB (ref 15–41)
Alkaline Phosphatase: 81 U/L (ref 38–126)
Anion gap: 10 (ref 5–15)
BILIRUBIN TOTAL: 0.4 mg/dL (ref 0.3–1.2)
BUN: 54 mg/dL — AB (ref 6–20)
CO2: 21 mmol/L — ABNORMAL LOW (ref 22–32)
CREATININE: 1.4 mg/dL — AB (ref 0.61–1.24)
Calcium: 8.3 mg/dL — ABNORMAL LOW (ref 8.9–10.3)
Chloride: 104 mmol/L (ref 101–111)
GFR calc Af Amer: 55 mL/min — ABNORMAL LOW (ref >60)
GFR, EST NON AFRICAN AMERICAN: 48 mL/min — AB (ref >60)
GLUCOSE: 113 mg/dL — AB (ref 65–99)
POTASSIUM: 4 mmol/L (ref 3.5–5.1)
SODIUM: 135 mmol/L (ref 135–145)
TOTAL PROTEIN: 6.1 g/dL — AB (ref 6.5–8.1)

## 2015-03-21 LAB — CBC WITH DIFFERENTIAL/PLATELET
BASOS ABS: 0 10*3/uL (ref 0.0–0.1)
Basophils Relative: 0 %
EOS ABS: 0 10*3/uL (ref 0.0–0.7)
EOS PCT: 0 %
HCT: 27.2 % — ABNORMAL LOW (ref 39.0–52.0)
Hemoglobin: 9.2 g/dL — ABNORMAL LOW (ref 13.0–17.0)
LYMPHS ABS: 1.1 10*3/uL (ref 0.7–4.0)
Lymphocytes Relative: 4 %
MCH: 27.3 pg (ref 26.0–34.0)
MCHC: 33.8 g/dL (ref 30.0–36.0)
MCV: 80.7 fL (ref 78.0–100.0)
MONO ABS: 2.2 10*3/uL — AB (ref 0.1–1.0)
Monocytes Relative: 8 %
NEUTROS PCT: 88 %
Neutro Abs: 24.8 10*3/uL — ABNORMAL HIGH (ref 1.7–7.7)
PLATELETS: 276 10*3/uL (ref 150–400)
RBC: 3.37 MIL/uL — AB (ref 4.22–5.81)
RDW: 18.9 % — AB (ref 11.5–15.5)
WBC: 28.1 10*3/uL — AB (ref 4.0–10.5)

## 2015-03-21 LAB — CULTURE, BLOOD (ROUTINE X 2)
CULTURE: NO GROWTH
CULTURE: NO GROWTH

## 2015-03-21 NOTE — Progress Notes (Signed)
[facility] PROGRESS NOTE   Alan Franco ENI:[id number] DOB: 1940/01/23 DOA: 03/16/2015 PCP: Cathlean Cower, MD  HPI/Subjective:  patient still complains of difficulty swallowing, no chest pain or shortness of breath, resting comfortably, afebrile overnight  Assessment/Plan: Principal Problem:   Acute on chronic respiratory failure with hypoxia (HCC) Active Problems:   HLD (hyperlipidemia)   SICKLE CELL TRAIT   Essential hypertension   Coronary atherosclerosis   COPD exacerbation (HCC)   GERD (gastroesophageal reflux disease)   BPH (benign prostatic hyperplasia)   Sepsis (HCC)   Lung cancer ([place])   Protein-calorie malnutrition, severe   CKD (chronic kidney disease), stage III   Dysphagia    Assessment and plan  Acute on chronic respiratory failure with hypoxia Likely due to COPD exacerbation. CT angiogram of chest did not show pulmonary embolism or infiltration.  Patient has wheezing bilaterally, indicating COPD exacerbation. Continue oxygen supplementation as needed. Initially on IV steroids, currently tapered to by mouth  persistent leukocytosis likely secondary to steroids, doubt infection  Acute COPD exacerbation Patient initially on IV antibiotics and IV steroids. IV cefepime which changed to levofloxacin, transition to by mouth prednisone Continue supportive management with bronchodilators, mucolytics, antitussives and oxygen as needed. Likely this is has laryngotracheitis component as patient complains about a lot of throat pain.  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 - Patient for modified barium swallow by SLP today - Reports some  odynophagia, will continue with IV Diflucan for possible fungal esophagitis.  Sepsis  Patient is septic with leukocytosis, fever, tachycardia and elevated lactate.  His lactate is normalized after treated with IV fluids in the emergency room.  Lactate 2.01-->1.62. Currently hemodynamically stable.   Lung Cancer: - being treated by Dr. Vena Austria in [facility], last chemotherapy was on 03/13/15. -f/u with primary oncologist in [facility] -Patient has brain metastases, has had, knife surgery on 03/15/2015  HLD:  - Last LDL was 103 -Continue home medications: Pravachol  Protein-calorie malnutrition, severe: -Ensure  CKD-III: Stable. Baseline creatinine 1.4-1.8, his creatinine is 1.55, BUN 37.  continue to follow renal function   CAD:  - no CP. -continue ASA and pravastatin  GERD: -Protonix  BPH: stable - Continue Flomax  Recently treated diarrhea:  - pt was recently hospitalized from a 1/2-1/12/17. During that admission, patient was treated for diarrhea with Flagyl for 7 days. Patient had negative C. difficile test. He was not discharged on Flagyl, but for unclear reason, patient was taking Flagyl at home for a while and then stopped taking it 10 days ago. Continue Florastor   Leukocytosis Significant leukocytosis WBC was 26.6 on admission, today is 28.1, does not appear toxic Unclear if he received Neulasta, he does not remember any subq injections after chemotherapy he had Tuesday before last.    Code Status: DNR Family Communication: Plan discussed with the patient. Disposition Plan: Evaluate for discharge in the next 1-2 days. Diet: Diet Heart Room service appropriate?: Yes; Fluid consistency:: Thin  Consultants:  None  Procedures:  None    Objective: Filed Vitals:   03/20/15 2203 03/21/15 0540  BP: 133/78 167/96  Pulse: 109 101  Temp: 97.7 F (36.5 C) 98.4 F (36.9 C)  Resp: 18 16    Intake/Output Summary (Last 24 hours) at 03/21/15 1303 Last data  filed at 03/21/15 0849  Gross per 24 hour  Intake      0 ml  Output   1250 ml  Net  -1250 ml   Filed Weights   03/16/15 1612 03/16/15 2247  Weight: 70.308 kg (155 lb) 66.906 kg (147 lb 8 oz)    Exam: General: Alert and awake, oriented x3, not in any acute distress. HEENT: anicteric sclera, pupils reactive to light and accommodation, EOMI CVS: S1-S2 clear, no murmur rubs or gallops Chest: clear to auscultation bilaterally, no wheezing, rales or rhonchi Abdomen: soft nontender, nondistended, normal bowel sounds, no organomegaly Extremities: no cyanosis, clubbing or edema noted bilaterally   Data Reviewed: Basic Metabolic Panel:  Recent Labs Lab 03/16/15 1548 03/18/15 0401 03/19/15 0500 03/20/15 0352 03/21/15 0420  NA 137 134* 137 137 135  K 4.1 4.4 4.3 4.3 4.0  CL 100* 106 106 104 104  CO2 24 20* 21* 23 21*  GLUCOSE 97 118* 141* 118* 113*  BUN 37* 40* 40* 48* 54*  CREATININE 1.55* 1.45* 1.32* 1.46* 1.40*  CALCIUM 8.9 8.5* 8.7* 8.9 8.3*   Liver Function Tests:  Recent Labs Lab 03/16/15 1548 03/21/15 0420  AST 52* 44*  ALT 34 48  ALKPHOS 98 81  BILITOT 0.5 0.4  PROT 7.9 6.1*  ALBUMIN 2.8* 2.3*   No results for input(s): LIPASE, AMYLASE in the last 168 hours. No results for input(s): AMMONIA in the last 168 hours. CBC:  Recent Labs Lab 03/16/15 1548 03/18/15 0401 03/20/15 0352 03/21/15 0420  WBC 26.6* 22.5* 27.3* 28.1*  NEUTROABS 18.3*  --   --  24.8*  HGB 10.7* 8.6* 9.4* 9.2*  HCT 32.3* 26.3* 29.5* 27.2*  MCV 80.3 81.2 80.8 80.7  PLT 397 293 307 276   Cardiac Enzymes: No results for input(s): CKTOTAL, CKMB, CKMBINDEX, TROPONINI in the last 168 hours. BNP (last 3 results) No results for input(s): BNP in the last 8760 hours.  ProBNP (last 3 results) No results for input(s): PROBNP in the last 8760 hours.  CBG: No results for input(s): GLUCAP in the last 168 hours.  Micro Recent Results (from the past 240 hour(s))  Urine culture     Status:  None   Collection Time: 03/16/15  3:39 PM  Result Value Ref Range Status   Specimen Description URINE, CLEAN CATCH  Final   Special Requests NONE  Final   Culture   Final    NO GROWTH 2 DAYS Performed at [facility]    Report Status 03/18/2015 FINAL  Final  Blood Culture (routine x 2)     Status: None (Preliminary result)   Collection Time: 03/16/15  3:47 PM  Result Value Ref Range Status   Specimen Description BLOOD PORTA CATH  Final   Special Requests BOTTLES DRAWN AEROBIC AND ANAEROBIC 5ML  Final   Culture   Final    NO GROWTH 4 DAYS Performed at [facility]    Report Status PENDING  Incomplete  Blood Culture (routine x 2)     Status: None (Preliminary result)   Collection Time: 03/16/15  4:05 PM  Result Value Ref Range Status   Specimen Description BLOOD LEFT HAND  Final   Special Requests IN PEDIATRIC BOTTLE 3 CC  Final   Culture   Final    NO GROWTH 4 DAYS Performed at [facility]    Report Status PENDING  Incomplete  MRSA PCR Screening     Status: None   Collection Time: 03/16/15 11:13  PM  Result Value Ref Range Status   MRSA by PCR NEGATIVE NEGATIVE Final    Comment:        The GeneXpert MRSA Assay (FDA approved for NASAL specimens only), is one component of a comprehensive MRSA colonization surveillance program. It is not intended to diagnose MRSA infection nor to guide or monitor treatment for MRSA infections.   Respiratory virus panel     Status: Abnormal   Collection Time: 03/17/15  9:50 PM  Result Value Ref Range Status   Respiratory Syncytial Virus A Negative Negative Final   Respiratory Syncytial Virus B Negative Negative Final   Influenza A Negative Negative Final   Influenza B Negative Negative Final   Parainfluenza 1 Negative Negative Final   Parainfluenza 2 Negative Negative Final   Parainfluenza 3 Negative Negative Final   Metapneumovirus Negative Negative Final   Rhinovirus Positive (A) Negative Final   Adenovirus  Negative Negative Final    Comment: (NOTE) Performed At: [facility] [address] [place], [place] [id number] Lindon Romp MD HE:[id number]      Studies: Ct Soft Tissue Neck W Contrast  03/20/2015  CLINICAL DATA:  Dysphagia and hoarseness for 4 weeks. History of stage IV lung cancer. EXAM: CT NECK WITH CONTRAST TECHNIQUE: Multidetector CT imaging of the neck was performed using the standard protocol following the bolus administration of intravenous contrast. CONTRAST:  30m OMNIPAQUE IOHEXOL 300 MG/ML  SOLN COMPARISON:  None. FINDINGS: Pharynx and larynx: Medial left arytenoid with dilated laryngeal ventricle. Findings consistent with left vocal fold dysfunction, presumably from the extensive intrathoracic nodal metastases. No primary pharyngeal or laryngeal lesion is seen. Salivary glands: Negative Thyroid: Negative Lymph nodes: Bilateral supraclavicular/ lower cervical chain malignant lymphadenopathy with nodal enlargement and cavitation. These were visualized on previous chest CT. Index left supraclavicular node measures 14 mm short axis. There is known intrathoracic and left axillary adenopathy from recent staging chest CT. Vascular: Soft tissue bulges into the lateral and lower left internal jugular vein, contiguous with neighboring metastasis. Extensive atherosclerosis without acute occlusion. Limited intracranial: No signs of metastatic disease. Cerebellar atrophy. Visualized orbits: Negative for mass where seen. Skeleton: Known large left second rib mass with extensive destruction and extraosseous tumor extension. No destructive metastasis in the cervical region. Extensive ossification of ligaments in the cervical spine with diffuse moderate canal stenosis. Mandibular and maxillary alveolar sclerosis, likely condensing osteitis. Upper chest: Recently staged by CT. IMPRESSION: 1. 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 2. Lower  cervical/supraclavicular malignant adenopathy bilaterally. Nonocclusive tumor thrombus in the lower left internal jugular vein. Electronically Signed   By: JMonte FantasiaM.D.   On: 03/20/2015 13:03   Dg Esophagus  03/20/2015  CLINICAL DATA:  Dysphagia. Food stuck in right-sided throat. Hoarseness. Lung carcinoma undergoing chemotherapy and radiation therapy. EXAM: ESOPHOGRAM/BARIUM SWALLOW TECHNIQUE: Single contrast examination was performed using thin and thick barium. 13 mm barium tablet was also administered. FLUOROSCOPY TIME:  Radiation Exposure Index (as provided by the fluoroscopic device): 23.5 mGy Number of Acquired Images:  12 COMPARISON:  None. FINDINGS: Intermittent vestibular penetration and aspiration was seen during swallowing. Extrinsic mass effect is seen the cervical esophagus from lower cervical vertebral osteophytes. No evidence of mass or stricture involving the cervical or thoracic esophagus. Severe esophageal dysmotility is seen with lack of primary peristalsis resulting in esophageal stasis. No evidence hiatal hernia. No gastroesophageal reflux of contrast demonstrated. An ingested 13 mm barium tablet passed freely through the esophagus and into the  stomach. IMPRESSION: Intermittent vestibular penetration and aspiration. No evidence of esophageal mass or stricture. Severe esophageal dysmotility. No evidence of hiatal hernia or gastroesophageal reflux. Electronically Signed   By: Earle Gell M.D.   On: 03/20/2015 10:42   Dg Swallowing Func-speech Pathology  03/21/2015  Objective Swallowing Evaluation: Type of Study: Bedside Swallow Evaluation Patient Details Name: Alan Franco MRN: [id number] Date of Birth: 14-Apr-1940 Today's Date: 03/21/2015 Time: SLP Start Time (ACUTE ONLY): 1029-SLP Stop Time (ACUTE ONLY): 1058 SLP Time Calculation (min) (ACUTE ONLY): 29 min Past Medical History: Past Medical History Diagnosis Date . ALCOHOL ABUSE 08/24/2009 . CAROTID ARTERY DISEASE 08/30/2009 .  CELLULITIS AND ABSCESS OF LEG EXCEPT FOOT 11/27/2009 . COPD 08/24/2009 . CORONARY ARTERY DISEASE 08/24/2009 . 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 GOUT 08/24/2009 . HEPATITIS C 08/24/2009 . HYPERLIPIDEMIA 08/24/2009 . HYPERTENSION 08/24/2009 . KNEE PAIN 02/14/2010 . MRSA 08/24/2009 . PERIPHERAL VASCULAR DISEASE 08/24/2009 . Preventative health care 08/09/2010 . RENAL INSUFFICIENCY 08/24/2009 . SICKLE CELL TRAIT 08/24/2009 . CKD (chronic kidney disease), stage III 02/06/2011 . GERD (gastroesophageal reflux disease) 02/06/2011 . BPH (benign prostatic hyperplasia) 02/06/2011 . Degenerative joint disease 08/10/2011 Past Surgical History: Past Surgical History Procedure Laterality Date . Knee surgury     arthoscopic on the right, Dr. French Ana . Hand surgery Right  HPI: 74 yo male admitted to [facility] with acute on chronic respiratory failure.  He has h/o lung cancer with mets, sickle cell trait, COPD, HTN, GERD, left vocal fold paralysis, ETOH use, Hep C and dysphagia.  He underwent esophagram 03/21/15 with findings of severe esophageal dysmotiltiy and aspiration/penetration.  Extrinsic mass effect is seen the cervical esophagus from cervical osteophyte.    Subjective: pt awake in chair Assessment / Plan / Recommendation CHL IP CLINICAL IMPRESSIONS 03/21/2015 Therapy Diagnosis Moderate pharyngeal phase dysphagia;Severe pharyngeal phase dysphagia;Mild oral phase dysphagia Clinical Impression Pt presents with mild oral and moderate pharyngeal dysphagia with sensorimotor deficits - presumed due to vagus nerve involvement from lung mass.  Pt did aspirate - significantly with cup bolus of nectar with head neutral and tracely with thin via straw with chin tuck.  He did overtly cough with significant amount of aspiration but did not sense trace aspiration.  Swallow response is inconsistent.  SMALL single boluses with head neutral of thin was not aspirated and only tracely penetrated clearing with cued cough/throat clearing.    Mild vallecular residuals noted  across consistencies due to weakness that was cleared with reflexive and cued dry swallows.  Barium tablet given with pudding transited through pharynx and esophagus readily.  Recommend medicine with pudding/puree due to pt's dysphagia.  Of note, pt able to cough and expectorate viscous secretions per SLP cue x1.  Encouraged him to maintain tongue base retraction to aid expectoration and subsequently airway protection.  Using live video and teach back, educated pt to findings/aspiration mitigation strategies.  Impact on safety and function Moderate aspiration risk   CHL IP TREATMENT RECOMMENDATION 03/21/2015 Treatment Recommendations Therapy as outlined in treatment plan below   Prognosis 03/21/2015 Prognosis for Safe Diet Advancement Guarded Barriers to Reach Goals Time post onset;Severity of deficits;Other (Comment) Barriers/Prognosis Comment -- CHL IP DIET RECOMMENDATION 03/21/2015 SLP Diet Recommendations Regular solids;Thin liquid Liquid Administration via Cup Medication Administration Whole meds with puree Compensations Slow rate;Small sips/bites;Multiple dry swallows after each bite/sip;Clear throat intermittently Postural Changes Seated upright at 90 degrees;Remain semi-upright after after feeds/meals (Comment)   CHL IP OTHER RECOMMENDATIONS 03/21/2015 Recommended Consults -- Oral Care Recommendations Oral care BID Other Recommendations --  CHL IP FOLLOW UP RECOMMENDATIONS 03/21/2015 Follow up Recommendations Home health SLP   CHL IP FREQUENCY AND DURATION 03/21/2015 Speech Therapy Frequency (ACUTE ONLY) min 2x/week Treatment Duration 2 weeks      CHL IP ORAL PHASE 03/21/2015 Oral Phase Impaired Oral - Pudding Teaspoon -- Oral - Pudding Cup -- Oral - Honey Teaspoon -- Oral - Honey Cup -- Oral - Nectar Teaspoon WFL Oral - Nectar Cup WFL Oral - Nectar Straw -- Oral - Thin Teaspoon Piecemeal swallowing Oral - Thin Cup Piecemeal swallowing Oral - Thin Straw Piecemeal swallowing Oral - Puree WFL Oral - Mech Soft -- Oral -  Regular WFL Oral - Multi-Consistency -- Oral - Pill WFL Oral Phase - Comment --  CHL IP PHARYNGEAL PHASE 03/21/2015 Pharyngeal Phase Impaired Pharyngeal- Pudding Teaspoon -- Pharyngeal -- Pharyngeal- Pudding Cup -- Pharyngeal -- Pharyngeal- Honey Teaspoon -- Pharyngeal -- Pharyngeal- Honey Cup -- Pharyngeal -- Pharyngeal- Nectar Teaspoon Reduced laryngeal elevation;Reduced anterior laryngeal mobility;Reduced airway/laryngeal closure;Pharyngeal residue - valleculae Pharyngeal -- Pharyngeal- Nectar Cup Reduced anterior laryngeal mobility;Reduced laryngeal elevation;Reduced airway/laryngeal closure;Moderate aspiration;Penetration/Aspiration before swallow;Pharyngeal residue - valleculae Pharyngeal Material enters airway, passes BELOW cords and not ejected out despite cough attempt by patient Pharyngeal- Nectar Straw -- Pharyngeal -- Pharyngeal- Thin Teaspoon Reduced anterior laryngeal mobility;Reduced laryngeal elevation;Reduced airway/laryngeal closure;Reduced tongue base retraction;Pharyngeal residue - valleculae;Pharyngeal residue - pyriform;Penetration/Aspiration during swallow Pharyngeal Material enters airway, remains ABOVE vocal cords then ejected out Pharyngeal- Thin Cup Reduced anterior laryngeal mobility;Reduced laryngeal elevation;Reduced airway/laryngeal closure;Moderate aspiration;Pharyngeal residue - valleculae Pharyngeal -- Pharyngeal- Thin Straw Reduced anterior laryngeal mobility;Reduced laryngeal elevation;Reduced airway/laryngeal closure;Trace aspiration;Reduced tongue base retraction;Penetration/Aspiration during swallow Pharyngeal Material enters airway, passes BELOW cords then ejected out Pharyngeal- Puree Reduced anterior laryngeal mobility;Reduced laryngeal elevation;Reduced airway/laryngeal closure;Pharyngeal residue - valleculae Pharyngeal -- Pharyngeal- Mechanical Soft -- Pharyngeal -- Pharyngeal- Regular Reduced anterior laryngeal mobility;Reduced laryngeal elevation;Reduced airway/laryngeal  closure;Pharyngeal residue - valleculae Pharyngeal -- Pharyngeal- Multi-consistency -- Pharyngeal -- Pharyngeal- Pill Reduced anterior laryngeal mobility;Reduced laryngeal elevation;Reduced airway/laryngeal closure;Pharyngeal residue - valleculae Pharyngeal -- Pharyngeal Comment small single boluses with head neutral of thin was not aspirated and only tracely penetration.  Pt with inconsistent swallow response with overt aspiration occuring with nectar head neutral - which fortunately produced reflexive cough response.   Chin tuck with thin via straw tracely aspirated = just below vocal cords and cleared with cued cough.  Mild vallecular residuals noted that decreased with reflexive and cued dry swallows.    CHL IP CERVICAL ESOPHAGEAL PHASE 03/21/2015 Cervical Esophageal Phase Impaired Pudding Teaspoon -- Pudding Cup -- Honey Teaspoon -- Honey Cup -- Nectar Teaspoon -- Nectar Cup -- Nectar Straw -- Thin Teaspoon -- Thin Cup -- Thin Straw -- Puree -- Mechanical Soft -- Regular -- Multi-consistency -- Pill -- Cervical Esophageal Comment appearance of minimally decreased proximal esophagus clearance with thin - and frequent belching- which he states is normal No flowsheet data found. Janett Labella [place], [facility] SLP [phone]               Scheduled Meds: . aspirin  81 mg Oral Daily  . enoxaparin (LOVENOX) injection  40 mg Subcutaneous Q24H  . feeding supplement  1 Container Oral TID WC  . feeding supplement (ENSURE ENLIVE)  237 mL Oral BID BM  . fluconazole (DIFLUCAN) IV  200 mg Intravenous Q24H  . guaiFENesin  1,200 mg Oral BID  . pantoprazole  40 mg Oral Daily  . pravastatin  10 mg Oral q1800  . predniSONE  50 mg  Oral Q breakfast  . sodium chloride flush  10-40 mL Intracatheter Q12H   Continuous Infusions:       Time spent: 30 minutes    Ashanna Heinsohn  [facility] Pager [phone] , 7PM-7AM, please contact night-coverage at [email or web address], password [facility] 03/21/2015, 1:03  PM  LOS: 5 days

## 2015-03-21 NOTE — Progress Notes (Signed)
Physical Therapy Treatment Patient Details Name: Alan Franco MRN: [id number] DOB: 1940/10/20 Today's Date: 03/21/2015    History of Present Illness Alan Franco is a 75 y.o. male with PMH of lung cancer, hyperlipidemia, COPD, GERD, gout, alcohol abuse, carotid artery stenosis, CAD, HCV, PAD, chronic kidney disease-, who presents with a cough and shortness of breath.  CTA showed no PE or infiltration, but showed multiple bony metastatic lesions involving the rib cage and sternum, diffuse hilar and mediastinal adenopathy consistent with the patient's given clinical history of lung carcinoma. This contributes to some degree of compression on the pulmonary artery centrally and Left adrenal metastatic disease.     PT Comments    Pt progressing well, discussed energy conservation techniques; pt states he would like to walk more than he has been; encouraged pt to amb with nursing as well as therapy  Follow Up Recommendations  No PT follow up     Equipment Recommendations  None recommended by PT    Recommendations for Other Services       Precautions / Restrictions Precautions Precautions: Fall Precaution Comments: h/o acetab mets in 1/17 Restrictions Weight Bearing Restrictions: No Other Position/Activity Restrictions: no WB restrictions last admit    Mobility  Bed Mobility Overal bed mobility: Independent                Transfers Overall transfer level: Modified independent                  Ambulation/Gait Ambulation/Gait assistance: Supervision Ambulation Distance (Feet): 280 Feet Assistive device: None (IV pole) Gait Pattern/deviations: Step-through pattern     General Gait Details: trendelenburg gait without support of IV pole, pt reports he often uses cane at baseline; 2/4 DOE; sats 97% and HR 99 after amb, RA   Stairs            Wheelchair Mobility    Modified Rankin (Stroke Patients Only)       Balance                                     Cognition Arousal/Alertness: Awake/alert Behavior During Therapy: WFL for tasks assessed/performed Overall Cognitive Status: Within Functional Limits for tasks assessed                      Exercises      General Comments        Pertinent Vitals/Pain Pain Assessment: Faces Faces Pain Scale: Hurts a little bit Pain Location: L hip Pain Descriptors / Indicators: Sore Pain Intervention(s): Limited activity within patient's tolerance;Monitored during session    Home Living                      Prior Function            PT Goals (current goals can now be found in the care plan section) Acute Rehab PT Goals Patient Stated Goal: to go home PT Goal Formulation: With patient Time For Goal Achievement: 03/31/15 Potential to Achieve Goals: Good Progress towards PT goals: Progressing toward goals    Frequency  Min 3X/week    PT Plan Current plan remains appropriate    Co-evaluation             End of Session Equipment Utilized During Treatment: Gait belt Activity Tolerance: Patient tolerated treatment well Patient left: in bed;with call bell/phone within reach  Time: 9784-7841 PT Time Calculation (min) (ACUTE ONLY): 11 min  Charges:  $Gait Training: 8-22 mins                    G Codes:      Alan Franco 04/10/2015, 2:33 PM

## 2015-03-21 NOTE — Progress Notes (Signed)
Objective Swallowing Evaluation: Type of Study: Bedside Swallow Evaluation  Patient Details  Name: SUHAIB GUZZO MRN: [id number] Date of Birth: [birth year]  Today's Date: 03/21/2015 Time: SLP Start Time (ACUTE ONLY): 1029-SLP Stop Time (ACUTE ONLY): 1058 SLP Time Calculation (min) (ACUTE ONLY): 29 min  Past Medical History:  Past Medical History  Diagnosis Date  . ALCOHOL ABUSE 08/24/2009  . CAROTID ARTERY DISEASE 08/30/2009  . CELLULITIS AND ABSCESS OF LEG EXCEPT FOOT 11/27/2009  . COPD 08/24/2009  . CORONARY ARTERY DISEASE 08/24/2009  . 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 GOUT 08/24/2009  . HEPATITIS C 08/24/2009  . HYPERLIPIDEMIA 08/24/2009  . HYPERTENSION 08/24/2009  . KNEE PAIN 02/14/2010  . MRSA 08/24/2009  . PERIPHERAL VASCULAR DISEASE 08/24/2009  . Preventative health care 08/09/2010  . RENAL INSUFFICIENCY 08/24/2009  . SICKLE CELL TRAIT 08/24/2009  . CKD (chronic kidney disease), stage III 02/06/2011  . GERD (gastroesophageal reflux disease) 02/06/2011  . BPH (benign prostatic hyperplasia) 02/06/2011  . Degenerative joint disease 08/10/2011   Past Surgical History:  Past Surgical History  Procedure Laterality Date  . Knee surgury      arthoscopic on the right, Dr. French Ana  . Hand surgery Right    HPI: 75 yo male admitted to [facility] with acute on chronic respiratory failure.  He has h/o lung cancer with mets, sickle cell trait, COPD, HTN, GERD, left vocal fold paralysis, ETOH use, Hep C and dysphagia.  He underwent esophagram 03/21/15 with findings of severe esophageal dysmotiltiy and aspiration/penetration.  Extrinsic mass effect is seen the cervical esophagus from cervical osteophyte.     Subjective: pt awake in chair  Assessment / Plan / Recommendation  CHL IP CLINICAL IMPRESSIONS 03/21/2015  Therapy Diagnosis Moderate pharyngeal phase dysphagia;Severe pharyngeal phase dysphagia;Mild oral phase dysphagia  Clinical Impression Pt presents with mild oral and moderate pharyngeal dysphagia with  sensorimotor deficits - presumed due to vagus nerve involvement from lung mass.    Pt did aspirate - significantly with cup bolus of nectar with head neutral and tracely with thin via straw with chin tuck.  He did overtly cough with significant amount of aspiration but did not sense trace aspiration.  Swallow response is inconsistent.  SMALL single boluses with head neutral of thin was not aspirated and only tracely penetrated clearing with cued cough/throat clearing.    Mild vallecular residuals noted across consistencies due to weakness that was cleared with reflexive and cued dry swallows.    Barium tablet given with pudding transited through pharynx and esophagus readily.  Recommend medicine with pudding/puree due to pt's dysphagia.   Of note, pt able to cough and expectorate viscous secretions per SLP cue x1.  Encouraged him to maintain tongue base retraction to aid expectoration and subsequently airway protection.    Using live video and teach back, educated pt to findings/aspiration mitigation strategies.   Impact on safety and function Moderate aspiration risk      CHL IP TREATMENT RECOMMENDATION 03/21/2015  Treatment Recommendations Therapy as outlined in treatment plan below     Prognosis 03/21/2015  Prognosis for Safe Diet Advancement Guarded  Barriers to Reach Goals Time post onset;Severity of deficits;Other (Comment)  Barriers/Prognosis Comment --    CHL IP DIET RECOMMENDATION 03/21/2015  SLP Diet Recommendations Regular solids;Thin liquid  Liquid Administration via Cup  Medication Administration Whole meds with puree  Compensations Slow rate;Small sips/bites;Multiple dry swallows after each bite/sip;Clear throat intermittently  Postural Changes Seated upright at 90 degrees;Remain semi-upright after after feeds/meals (Comment)  CHL IP OTHER RECOMMENDATIONS 03/21/2015  Recommended Consults --  Oral Care Recommendations Oral care BID  Other Recommendations --      CHL IP  FOLLOW UP RECOMMENDATIONS 03/21/2015  Follow up Recommendations Home health SLP      CHL IP FREQUENCY AND DURATION 03/21/2015  Speech Therapy Frequency (ACUTE ONLY) min 2x/week  Treatment Duration 2 weeks           CHL IP ORAL PHASE 03/21/2015  Oral Phase Impaired  Oral - Pudding Teaspoon --  Oral - Pudding Cup --  Oral - Honey Teaspoon --  Oral - Honey Cup --  Oral - Nectar Teaspoon WFL  Oral - Nectar Cup WFL  Oral - Nectar Straw --  Oral - Thin Teaspoon Piecemeal swallowing  Oral - Thin Cup Piecemeal swallowing  Oral - Thin Straw Piecemeal swallowing  Oral - Puree WFL  Oral - Mech Soft --  Oral - Regular WFL  Oral - Multi-Consistency --  Oral - Pill WFL  Oral Phase - Comment --    CHL IP PHARYNGEAL PHASE 03/21/2015  Pharyngeal Phase Impaired  Pharyngeal- Pudding Teaspoon --  Pharyngeal --  Pharyngeal- Pudding Cup --  Pharyngeal --  Pharyngeal- Honey Teaspoon --  Pharyngeal --  Pharyngeal- Honey Cup --  Pharyngeal --  Pharyngeal- Nectar Teaspoon Reduced laryngeal elevation;Reduced anterior laryngeal mobility;Reduced airway/laryngeal closure;Pharyngeal residue - valleculae  Pharyngeal --  Pharyngeal- Nectar Cup Reduced anterior laryngeal mobility;Reduced laryngeal elevation;Reduced airway/laryngeal closure;Moderate aspiration;Penetration/Aspiration before swallow;Pharyngeal residue - valleculae  Pharyngeal Material enters airway, passes BELOW cords and not ejected out despite cough attempt by patient  Pharyngeal- Nectar Straw --  Pharyngeal --  Pharyngeal- Thin Teaspoon Reduced anterior laryngeal mobility;Reduced laryngeal elevation;Reduced airway/laryngeal closure;Reduced tongue base retraction;Pharyngeal residue - valleculae;Pharyngeal residue - pyriform;Penetration/Aspiration during swallow  Pharyngeal Material enters airway, remains ABOVE vocal cords then ejected out  Pharyngeal- Thin Cup Reduced anterior laryngeal mobility;Reduced laryngeal elevation;Reduced  airway/laryngeal closure;Moderate aspiration;Pharyngeal residue - valleculae  Pharyngeal --  Pharyngeal- Thin Straw Reduced anterior laryngeal mobility;Reduced laryngeal elevation;Reduced airway/laryngeal closure;Trace aspiration;Reduced tongue base retraction;Penetration/Aspiration during swallow  Pharyngeal Material enters airway, passes BELOW cords then ejected out  Pharyngeal- Puree Reduced anterior laryngeal mobility;Reduced laryngeal elevation;Reduced airway/laryngeal closure;Pharyngeal residue - valleculae  Pharyngeal --  Pharyngeal- Mechanical Soft --  Pharyngeal --  Pharyngeal- Regular Reduced anterior laryngeal mobility;Reduced laryngeal elevation;Reduced airway/laryngeal closure;Pharyngeal residue - valleculae  Pharyngeal --  Pharyngeal- Multi-consistency --  Pharyngeal --  Pharyngeal- Pill Reduced anterior laryngeal mobility;Reduced laryngeal elevation;Reduced airway/laryngeal closure;Pharyngeal residue - valleculae  Pharyngeal --  Pharyngeal Comment small single boluses with head neutral of thin was not aspirated and only tracely penetration.  Pt with inconsistent swallow response with overt aspiration occuring with nectar head neutral - which fortunately produced reflexive cough response.   Chin tuck with thin via straw tracely aspirated = just below vocal cords and cleared with cued cough.  Mild vallecular residuals noted that decreased with reflexive and cued dry swallows.       CHL IP CERVICAL ESOPHAGEAL PHASE 03/21/2015  Cervical Esophageal Phase Impaired  Pudding Teaspoon --  Pudding Cup --  Honey Teaspoon --  Honey Cup --  Nectar Teaspoon --  Nectar Cup --  Nectar Straw --  Thin Teaspoon --  Thin Cup --  Thin Straw --  Puree --  Mechanical Soft --  Regular --  Multi-consistency --  Pill --  Cervical Esophageal Comment appearance of decreased proximal esophagus clearance with thin - and frequent belching    Luanna Salk, MS [facility]  SLP [phone]

## 2015-03-21 NOTE — Progress Notes (Signed)
Nutrition Follow-up  DOCUMENTATION CODES:   Severe malnutrition in context of chronic illness  INTERVENTION:  - Continue Ensure Enlive TID and Boost Breeze BID - RD will continue to monitor for additional needs  NUTRITION DIAGNOSIS:   Increased nutrient needs related to cancer and cancer related treatments as evidenced by estimated needs. -ongoing  GOAL:   Patient will meet greater than or equal to 90% of their needs -met with meals and supplements  MONITOR:   PO intake, Supplement acceptance, Labs, Weight trends, I & O's  ASSESSMENT:   75 y.o. male with PMH of lung cancer (being treated by Dr. Vena Austria in [place], last chemotherapy was on 03/13/15), hyperlipidemia, COPD, GERD, gout, alcohol abuse (quit on 12/17/1989), carotid artery stenosis, CAD, HCV, PAD, chronic kidney disease-stage III, BPH, who presents with a cough and shortness of breath.  3/1 Per chart review, pt has been eating 100% of meals on average since previous assessment. Pt reports that his appetite has been good and that he is eating well without issue at each meal. He states he has been receiving nutrition supplements as ordered and that he has been drinking at least 1 of each supplement each day. Pt also saves items, such as fruit, from meal trays to eat between meals. Pt has questions about salt on current diet order and this was explained to him.  Physical assessment performed and shows moderate and severe muscle wasting and moderate and severe fat wasting. Pt meets criteria for severe malnutrition. Likely meeting needs at this time. Notes indicate plan for MBS to be done.  Medications reviewed. Labs reviewed; BUN/creatinine elevated, Ca: 8.3 mg/dL, GFR: 55.    2/26 - RD attempted to see patient x 2, once when staff in room and another when patient had visitors in room.  - Patient stated he was having a prayer session at that time. RD to attempt to speak with patient at a later time.  - Per chart review, pt last  received chemotherapy for lung cancer with brain mets 2/21. - Pt was seen by RD during admission in January.  - He was then diagnosed with severe malnutrition and ordered Boost Breeze supplements.  - Suspect pt's malnutrition continues given continued weight loss of 10 lb since 1/06 (6% wt loss x 2 months, significant for time frame).  - RD to order Boost Breeze supplements and will assess tolerance and acceptance at follow-up.  - PO intake: 50% this am of Heart Healthy diet. 100% meal completion yesterday.  - Pt reports in MD notes of raw throat.  - Will attempt NFPE at follow-up.    Diet Order:  Diet Heart Room service appropriate?: Yes; Fluid consistency:: Thin  Skin:  Reviewed, no issues  Last BM:  2/28  Height:   Ht Readings from Last 1 Encounters:  03/16/15 '6\' 1"'  (1.854 m)    Weight:   Wt Readings from Last 1 Encounters:  03/16/15 147 lb 8 oz (66.906 kg)    Ideal Body Weight:  83.6 kg  BMI:  Body mass index is 19.46 kg/(m^2).  Estimated Nutritional Needs:   Kcal:  2100-2300  Protein:  100-110g  Fluid:  2.1L/day  EDUCATION NEEDS:   No education needs identified at this time     Jarome Matin, RD, LDN Inpatient Clinical Dietitian Pager # [phone] After hours/weekend pager # [phone]

## 2015-03-21 NOTE — Care Management Important Message (Signed)
Important Message  Patient Details  Name: Alan Franco MRN: [id number] Date of Birth: 1940-11-05   Medicare Important Message Given:  Yes    [name] 03/21/2015, 1:01 PMImportant Message  Patient Details  Name: Alan Franco MRN: [id number] Date of Birth: [birth year]   Medicare Important Message Given:  Yes    [name] 03/21/2015, 1:01 PM

## 2015-03-21 NOTE — Progress Notes (Signed)
Pt awake and eating breakfast. SLP advised that I reviewed his esophagram from yesterday and will plan for MBS to determine if compensation strategies may be helpful.  Pt agreeable to plan. Luanna Salk, [place] [facility] SLP [phone]

## 2015-03-22 MED ORDER — ACETAMINOPHEN 325 MG PO TABS: 650.0000 mg | ORAL_TABLET | Freq: Four times a day (QID) | ORAL | Status: DC | PRN

## 2015-03-22 MED ORDER — PREDNISONE 10 MG PO TABS: ORAL_TABLET | ORAL | Status: AC

## 2015-03-22 MED ORDER — POTASSIUM CHLORIDE CRYS ER 20 MEQ PO TBCR: 20.0000 meq | EXTENDED_RELEASE_TABLET | Freq: Every day | ORAL | Status: AC

## 2015-03-22 MED ORDER — HEPARIN SOD (PORK) LOCK FLUSH 100 UNIT/ML IV SOLN
500.0000 [IU] | INTRAVENOUS | Status: AC | PRN
  Administered 2015-03-22: 500 [IU]

## 2015-03-22 MED ORDER — GUAIFENESIN-DM 100-10 MG/5ML PO SYRP: 5.0000 mL | ORAL_SOLUTION | ORAL | Status: AC | PRN

## 2015-03-22 NOTE — Progress Notes (Addendum)
Speech Language Pathology Treatment: Dysphagia  Patient Details Name: Alan Franco MRN: [id number] DOB: 11/03/1940 Today's Date: 03/22/2015 Time: 2411-4643 SLP Time Calculation (min) (ACUTE ONLY): 25 min  Assessment / Plan / Recommendation Clinical Impression  Pt seen to assess po tolerance, review MBS findings from prior date and reinforce effective compensation strategies.  Pt reports his swallowing his better and he is eager to go home.    Did not observe pt with more than a few boluses of tea due to IV team removing pt's port and [facility] (he had just missed call).    Provided written information for pt including heimlich maneuver, aspiration mitigation strategies with multifactorial dysphagia and xerostomia compensations.  Pt able to state that he should NOT tuck his chin with liquids due to residuals spilling into airway during MBS after SLP reminded him of testing results.  He does report chin tuck likely helping with solid clearance- which SLP approved.  Encouraged him to continue to "hock" to clear even after meals due to his sensory impairment.  If pt is coughing during intake, is most likely aspiration - reinforced pH neutral status of water and solids more caustic to airway then liquids.    As all education is completed and pt with good understanding of information, will sign off.  Folllow up as OP for brief dysphagia management.  He may benefit from pharyngeal strengthening exercises.  Please reorder if desired.  Thanks.       HPI HPI: 75 yo male admitted to [facility] with acute on chronic respiratory failure.  He has h/o lung cancer with mets, sickle cell trait, COPD, HTN, GERD, left vocal fold paralysis, ETOH use, Hep C and dysphagia.  He underwent esophagram 03/21/15 with findings of severe esophageal dysmotiltiy and aspiration/penetration.  Extrinsic mass effect is seen the cervical esophagus from cervical osteophyte.         SLP Plan  All goals met      Recommendations  Diet recommendations: Regular;Thin liquid Liquids provided via: Cup;Straw Medication Administration: Whole meds with puree Supervision: Patient able to self feed Compensations: Slow rate;Small sips/bites;Multiple dry swallows after each bite/sip;Clear throat intermittently (start meals with liquids) Postural Changes and/or Swallow Maneuvers: Seated upright 90 degrees;Upright 30-60 min after meal             Oral Care Recommendations: Oral care before and after PO Follow up Recommendations:  OP SLP  Plan: All goals met     [place], [place] [facility] SLP [phone]

## 2015-03-22 NOTE — Progress Notes (Signed)
Patient and family given discharge, follow up and medication instructions, verbalized understanding, prescription given to pt, Port-a-cath deaccessed per IV team nurse, telemetry box removed, family to transport home

## 2015-03-22 NOTE — Discharge Instructions (Signed)
Follow with Primary MD Cathlean Cower, MD in 7 days   Get CBC, CMP, 2 view Chest X ray checked  by Primary MD next visit.    Activity: As tolerated with Full fall precautions use walker/cane & assistance as needed   Disposition Home    Diet: Heart Healthy , with feeding assistance and aspiration precautions.  For Heart failure patients - Check your Weight same time everyday, if you gain over 2 pounds, or you develop in leg swelling, experience more shortness of breath or chest pain, call your Primary MD immediately. Follow Cardiac Low Salt Diet and 1.5 lit/day fluid restriction.   On your next visit with your primary care physician please Get Medicines reviewed and adjusted.   Please request your Prim.MD to go over all Hospital Tests and Procedure/Radiological results at the follow up, please get all Hospital records sent to your Prim MD by signing hospital release before you go home.   If you experience worsening of your admission symptoms, develop shortness of breath, life threatening emergency, suicidal or homicidal thoughts you must seek medical attention immediately by calling 911 or calling your MD immediately  if symptoms less severe.  You Must read complete instructions/literature along with all the possible adverse reactions/side effects for all the Medicines you take and that have been prescribed to you. Take any new Medicines after you have completely understood and accpet all the possible adverse reactions/side effects.   Do not drive, operating heavy machinery, perform activities at heights, swimming or participation in water activities or provide baby sitting services if your were admitted for syncope or siezures until you have seen by Primary MD or a Neurologist and advised to do so again.  Do not drive when taking Pain medications.    Do not take more than prescribed Pain, Sleep and Anxiety Medications  Special Instructions: If you have smoked or chewed Tobacco  in the  last 2 yrs please stop smoking, stop any regular Alcohol  and or any Recreational drug use.  Wear Seat belts while driving.   Please note  You were cared for by a hospitalist during your hospital stay. If you have any questions about your discharge medications or the care you received while you were in the hospital after you are discharged, you can call the unit and asked to speak with the hospitalist on call if the hospitalist that took care of you is not available. Once you are discharged, your primary care physician will handle any further medical issues. Please note that NO REFILLS for any discharge medications will be authorized once you are discharged, as it is imperative that you return to your primary care physician (or establish a relationship with a primary care physician if you do not have one) for your aftercare needs so that they can reassess your need for medications and monitor your lab values.

## 2015-03-22 NOTE — Discharge Summary (Addendum)
Alan Franco, is a 75 y.o. male  DOB [birth year]  MRN [id number].  Admission date:  03/16/2015  Admitting Physician  Ivor Costa, MD  Discharge Date:  03/22/2015   Primary MD  Cathlean Cower, MD  Recommendations for primary care physician for things to follow:  - Please check CBC, BMP during next visit - [facility] to [facility] Merrie Roof - Continue with speech language pathology follow-up as an outpatient  Addendum: Received a call from Dr. 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 Merrie Roof will arrange for him to continue with as an outpatient. Admission Diagnosis  Cough [R05] CKD (chronic kidney disease), stage III [N18.3] Sepsis, due to unspecified organism [facility]) [A41.9]   Discharge Diagnosis  Cough [R05] CKD (chronic kidney disease), stage III [N18.3] Sepsis, due to unspecified organism [facility]) [A41.9]    Principal Problem:   Acute on chronic respiratory failure with hypoxia ([place]) Active Problems:   HLD (hyperlipidemia)   SICKLE CELL TRAIT   Essential hypertension   Coronary atherosclerosis   COPD exacerbation (HCC)   GERD (gastroesophageal reflux disease)   BPH (benign prostatic hyperplasia)   Sepsis (HCC)   Lung cancer (HCC)   Protein-calorie malnutrition, severe   CKD (chronic kidney disease), stage III   Dysphagia      Past Medical History  Diagnosis Date  . ALCOHOL ABUSE 08/24/2009  . CAROTID ARTERY DISEASE 08/30/2009  . CELLULITIS AND ABSCESS OF LEG EXCEPT FOOT 11/27/2009  . COPD 08/24/2009  . CORONARY ARTERY DISEASE 08/24/2009  . 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 GOUT 08/24/2009  . HEPATITIS C 08/24/2009  . HYPERLIPIDEMIA 08/24/2009  . HYPERTENSION 08/24/2009  . KNEE PAIN 02/14/2010  . MRSA 08/24/2009  . PERIPHERAL VASCULAR DISEASE 08/24/2009  . Preventative health care 08/09/2010  . RENAL INSUFFICIENCY 08/24/2009  . SICKLE CELL TRAIT 08/24/2009  . CKD (chronic kidney disease), stage III 02/06/2011  . GERD (gastroesophageal reflux disease) 02/06/2011  . BPH (benign prostatic hyperplasia) 02/06/2011  . Degenerative joint disease 08/10/2011    Past Surgical History  Procedure Laterality Date  . Knee surgury      arthoscopic on the right, Dr. French Ana  . Hand surgery Right        History of present illness and  Hospital Course:     Kindly see H&P for history of present illness and admission details, please review complete Labs, Consult reports and Test reports for all details in brief  HPI  from the history and physical done on the day of admission 03/16/2015 HPI: Alan Franco is a 75 y.o. male with PMH of lung cancer (being treated by Dr. Vena Austria in [place], last chemotherapy was on 03/13/15), hyperlipidemia,  COPD, GERD, gout, alcohol abuse (quit on 12/17/1989), carotid artery stenosis, CAD, HCV, PAD, chronic kidney disease-stage III, BPH, who presents with a cough and shortness of breath.  Patient reports that he has been having cough and shortness of breath in the past 3 days. He coughs up white mucus. He does not have fever, but has chills for 4 days. He has runny nose, but no sore throat. He also has Hiccups. He does not have chest pain, abdominal pain, nausea, vomiting, diarrhea, symptoms of UTI or unilateral weakness.  In ED, patient was found to have WBC 26.6, temperature while 2.4, tachycardia, transient tachypnea, stable renal function, lactate 2.01--> 1.62, negative troponin, negative urinalysis. CTA showed no PE or infiltration, but showed multiple bony metastatic lesions involving the rib cage and sternum, diffuse hilar and mediastinal adenopathy  consistent with the patient's given clinical history of lung carcinoma. This contributes to some degree of compression on the pulmonary artery centrally and eccentric to the left. Left adrenal metastatic disease. Patient is admitted to inpatient for further evaluation treatment.   Hospital Course   Acute on chronic respiratory failure with hypoxia Likely due to COPD exacerbation. CT angiogram of chest did not show pulmonary embolism or infiltration.  Patient has wheezing bilaterally, indicating COPD exacerbation on admission. Resolved, on RA. Initially on IV steroids, currently transitioned to by mouth steroids, continue with steroid taper as an outpatient persistent leukocytosis likely secondary to steroids, doubt infection  Acute COPD exacerbation Patient initially on IV antibiotics and IV steroids. IV cefepime which changed to levofloxacin, transitioned to by mouth prednisone Continue supportive management with bronchodilators, mucolytics, antitussives and oxygen as needed.   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 - Status post modified barium swallow, moderate aspiration risk, discharged on regular diet, arrange for SLP at home   Sepsis  Patient is septic with leukocytosis, fever, tachycardia and elevated lactate.  His lactate is normalized after treated with IV fluids in the emergency room.  Lactate 2.01-->1.62. Currently hemodynamically stable.   Lung Cancer - being treated by Dr. Merrie Roof in [place], last chemotherapy was on 03/13/15. -f/u with primary oncologist in [facility] -Patient has brain  metastases, has had, knife surgery on 03/15/2015 - Patient with evidence of nonocclusive tumor thrombus of left internal, was discussed with his primary oncologist Dr. Merrie Roof at [facility], at this point no indication for anticoagulation, they  will follow closely  ++please read addendum at beginning of DC summary++.  HLD:  - Last LDL was 103 -Continue home medications: Pravachol  Protein-calorie malnutrition, severe: -Ensure  CKD-III: Stable. Baseline creatinine 1.4-1.8, his creatinine is 1.55, BUN 37. continue to follow renal function   CAD:  - no CP. -continue ASA and pravastatin  GERD: -Protonix  BPH: stable - Continue Flomax  Recently treated diarrhea:  - pt was recently hospitalized from a 1/2-1/12/17. During that admission, patient was treated for diarrhea with Flagyl for 7 days. Patient had negative C. difficile test. He was not discharged on Flagyl, but for unclear reason, patient was taking Flagyl at home for a while and then stopped taking it 10 days ago. Continue Florastor   Leukocytosis Significant leukocytosis WBC was 26.6 on admission, today is 28.1, does not appear toxic, this is most likely  related to steroids, has on IV Solu-Medrol during hospital stay. Unclear if he received Neulasta, he does not remember any subq injections after chemotherapy he had Tuesday before last.        Discharge Condition: stable   Follow UP  Follow-up Information    Follow up with Cathlean Cower, MD. Schedule an appointment as soon as possible for a visit in 1 week.   Specialties:  Internal Medicine, Radiology   Why:  Posthospitalization follow-up   Contact information:   [place] [address] [phone]         Discharge Instructions  and  Discharge Medications     Discharge Instructions    Discharge instructions    Complete by:  As directed   Follow with Primary MD Cathlean Cower, MD in 7 days   Get CBC, CMP, 2 view Chest X ray checked   by Primary MD next visit.    Activity: As tolerated with Full fall precautions use walker/cane & assistance as needed   Disposition Home **   Diet: Heart Healthy ** , with feeding assistance and aspiration precautions.  For Heart failure patients - Check your Weight same time everyday, if you gain over 2 pounds, or you develop in leg swelling, experience more shortness of breath or chest pain, call your Primary MD immediately. Follow Cardiac Low Salt Diet and 1.5 lit/day fluid restriction.   On your next visit with your primary care physician please Get Medicines reviewed and adjusted.   Please request your Prim.MD to go over all Hospital Tests and Procedure/Radiological results at the follow up, please get all Hospital records sent to your Prim MD by signing hospital release before you go home.   If you experience worsening of your admission symptoms, develop shortness of breath, life threatening emergency, suicidal or homicidal thoughts you must seek medical attention immediately by calling 911 or calling your MD immediately  if symptoms less severe.  You Must read complete instructions/literature along with all the possible adverse reactions/side effects for all the Medicines you take and that have been prescribed to you. Take any new Medicines after you have completely understood and accpet all the possible adverse reactions/side effects.   Do not drive, operating heavy machinery, perform activities at heights, swimming or participation in water activities or provide baby sitting services if your were admitted for syncope or siezures until you have seen by Primary MD or a Neurologist and advised to do so again.  Do not drive when taking Pain medications.    Do not take more than prescribed Pain, Sleep and Anxiety Medications  Special Instructions: If you have smoked or chewed Tobacco  in the last 2 yrs please stop smoking, stop any regular Alcohol  and or any Recreational drug  use.  Wear Seat belts while driving.   Please note  You were cared for by a hospitalist during your hospital stay. If you have any questions about your discharge medications or the care you received while you were in the hospital after you are discharged, you can call the unit and asked to speak with the hospitalist on call if the hospitalist that took care of you is not available. Once you are discharged, your primary care physician will handle any further medical issues. Please note that NO REFILLS for any discharge medications will be authorized once you are discharged, as it is imperative that you return to your primary care physician (or establish a relationship with a primary  care physician if you do not have one) for your aftercare needs so that they can reassess your need for medications and monitor your lab values.            Medication List    STOP taking these medications        allopurinol 100 MG tablet  Commonly known as:  ZYLOPRIM     celecoxib 100 MG capsule  Commonly known as:  CELEBREX     dexamethasone 1 MG tablet  Commonly known as:  DECADRON     metroNIDAZOLE 250 MG tablet  Commonly known as:  FLAGYL     naproxen sodium 220 MG tablet  Commonly known as:  ANAPROX      TAKE these medications        aspirin 81 MG chewable tablet  Chew 81 mg by mouth daily.     baclofen 10 MG tablet  Commonly known as:  LIORESAL  Take 10-20 mg by mouth 3 (three) times daily as needed (hiccups).     feeding supplement (ENSURE ENLIVE) Liqd  Take 237 mLs by mouth 2 (two) times daily between meals.     fluticasone 110 MCG/ACT inhaler  Commonly known as:  FLOVENT HFA  Inhale 1 puff into the lungs 2 (two) times daily.     guaiFENesin-dextromethorphan 100-10 MG/5ML syrup  Commonly known as:  ROBITUSSIN DM  Take 5 mLs by mouth every 4 (four) hours as needed for cough.     Ipratropium-Albuterol 20-100 MCG/ACT Aers respimat  Commonly known as:  COMBIVENT RESPIMAT  Inhale 1  puff into the lungs every 6 (six) hours as needed for wheezing.     nystatin 100000 UNIT/GM Powd  Use as directed to affected area up to three times per day     omeprazole 20 MG capsule  Commonly known as:  PRILOSEC  Take 1 capsule (20 mg total) by mouth 2 (two) times daily.     potassium chloride SA 20 MEQ tablet  Commonly known as:  K-DUR,KLOR-CON  Take 1 tablet (20 mEq total) by mouth daily.     pravastatin 10 MG tablet  Commonly known as:  PRAVACHOL  Take 10 mg by mouth daily.     predniSONE 10 MG tablet  Commonly known as:  DELTASONE  Please take 40 mg oral daily for 3 days, then 30 mg oral daily for 2 days, then 20 mg oral daily for 2 days, then 10 mg oral daily for 2 days, then stop     tamsulosin 0.4 MG Caps capsule  Commonly known as:  FLOMAX  Take 1 capsule (0.4 mg total) by mouth daily.          Diet and Activity recommendation: See Discharge Instructions above   Consults obtained -   none  Major procedures and Radiology Reports - PLEASE review detailed and final reports for all details, in brief -     Dg Chest 2 View  03/16/2015  CLINICAL DATA:  Shortness of breath, chills today. History of lung cancer and COPD. Last chemotherapy 4 days ago. EXAM: CHEST  2 VIEW COMPARISON:  01/26/2015 FINDINGS: Right Port-A-Cath remains in place, unchanged. Bony destruction within the left second rib with associated soft tissue mass again noted, likely not significantly changed. Vague opacity projecting over the right upper lobe near the porta catheter may reflect a soft tissue mass in the region of the anterior right third rib, also unchanged since prior study. Mild hyperinflation of the lungs. Heart is normal size.  No effusions. IMPRESSION: Stable bony destruction and soft tissue mass involving the anterior left second rib. Possible anterior right third rib and soft tissue mass, stable. Mild hyperinflation/COPD. Electronically Signed   By: Rolm Baptise M.D.   On: 03/16/2015  14:43   Ct Soft Tissue Neck W Contrast  03/20/2015  CLINICAL DATA:  Dysphagia and hoarseness for 4 weeks. History of stage IV lung cancer. EXAM: CT NECK WITH CONTRAST TECHNIQUE: Multidetector CT imaging of the neck was performed using the standard protocol following the bolus administration of intravenous contrast. CONTRAST:  58m OMNIPAQUE IOHEXOL 300 MG/ML  SOLN COMPARISON:  None. FINDINGS: Pharynx and larynx: Medial left arytenoid with dilated laryngeal ventricle. Findings consistent with left vocal fold dysfunction, presumably from the extensive intrathoracic nodal metastases. No primary pharyngeal or laryngeal lesion is seen. Salivary glands: Negative Thyroid: Negative Lymph nodes: Bilateral supraclavicular/ lower cervical chain malignant lymphadenopathy with nodal enlargement and cavitation. These were visualized on previous chest CT. Index left supraclavicular node measures 14 mm short axis. There is known intrathoracic and left axillary adenopathy from recent staging chest CT. Vascular: Soft tissue bulges into the lateral and lower left internal jugular vein, contiguous with neighboring metastasis. Extensive atherosclerosis without acute occlusion. Limited intracranial: No signs of metastatic disease. Cerebellar atrophy. Visualized orbits: Negative for mass where seen. Skeleton: Known large left second rib mass with extensive destruction and extraosseous tumor extension. No destructive metastasis in the cervical region. Extensive ossification of ligaments in the cervical spine with diffuse moderate canal stenosis. Mandibular and maxillary alveolar sclerosis, likely condensing osteitis. Upper chest: Recently staged by CT. IMPRESSION: 1. 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 2. Lower cervical/supraclavicular malignant adenopathy bilaterally. Nonocclusive tumor thrombus in the lower left internal jugular vein. Electronically Signed   By: JMonte Fantasia M.D.   On: 03/20/2015 13:03   Ct Angio Chest Pe W/cm &/or Wo Cm  03/16/2015  CLINICAL DATA:  Shortness of Breath EXAM: CT ANGIOGRAPHY CHEST WITH CONTRAST TECHNIQUE: Multidetector CT imaging of the chest was performed using the standard protocol during bolus administration of intravenous contrast. Multiplanar CT image reconstructions and MIPs were obtained to evaluate the vascular anatomy. CONTRAST:  1054mOMNIPAQUE IOHEXOL 350 MG/ML SOLN COMPARISON:  Plain film from earlier in the same day, 01/26/2015 FINDINGS: Lungs are well aerated bilaterally. Emphysematous changes are again seen. No acute infiltrate or sizable effusion is noted. The thoracic inlet is within normal limits. There are multiple mediastinal lymph nodes identified. 2.2 cm node is noted adjacent to the origins of the left subclavian and left common carotid artery. A 1.7 cm lymph node is noted adjacent to the origin of the right subclavian artery. Conglomeration of lymph nodes is noted in the aortic or pulmonary window. This measures approximately 5.0 x 5.2 cm. Precarinal and subcarinal adenopathy is noted as well. Scattered hilar lymph nodes are noted. The largest of these is mid noted on the right measuring 2.1 cm in short axis. The thoracic aorta shows calcifications although no findings of dissection or aneurysmal dilatation are seen. The pulmonary artery is well visualized and mildly impinged centrally and on the left due to the lymphadenopathy. No filling defects to suggest pulmonary emboli are noted. The visualized upper abdomen demonstrates metastatic disease within the left adrenal gland similar to that seen on prior exam. The bony structures show degenerative change of the thoracic spine. Additionally there are expansile lesions within the rib cage bilaterally involving primarily the right third rib anteriorly as well of the  left second rib. Expansile lesion of the right fifth rib is noted as well. Destructive lesion of the mid sternum is  noted as well. These changes are consistent with metastatic disease. Right chest wall port is again noted. Review of the MIP images confirms the above findings. IMPRESSION: Multiple bony metastatic lesions involving the rib cage bilaterally as well as the sternum. Diffuse hilar and mediastinal adenopathy consistent with the patient's given clinical history of lung carcinoma. This contributes to some degree of compression on the pulmonary artery centrally and eccentric to the left. Left adrenal metastatic disease No evidence of pulmonary emboli. Chronic changes in the lungs bilaterally. Although not mentioned in the body of the report there is significant left axillary and chest wall lymphadenopathy identified. Electronically Signed   By: Inez Catalina M.D.   On: 03/16/2015 18:39   Dg Esophagus  03/20/2015  CLINICAL DATA:  Dysphagia. Food stuck in right-sided throat. Hoarseness. Lung carcinoma undergoing chemotherapy and radiation therapy. EXAM: ESOPHOGRAM/BARIUM SWALLOW TECHNIQUE: Single contrast examination was performed using thin and thick barium. 13 mm barium tablet was also administered. FLUOROSCOPY TIME:  Radiation Exposure Index (as provided by the fluoroscopic device): 23.5 mGy Number of Acquired Images:  12 COMPARISON:  None. FINDINGS: Intermittent vestibular penetration and aspiration was seen during swallowing. Extrinsic mass effect is seen the cervical esophagus from lower cervical vertebral osteophytes. No evidence of mass or stricture involving the cervical or thoracic esophagus. Severe esophageal dysmotility is seen with lack of primary peristalsis resulting in esophageal stasis. No evidence hiatal hernia. No gastroesophageal reflux of contrast demonstrated. An ingested 13 mm barium tablet passed freely through the esophagus and into the stomach. IMPRESSION: Intermittent vestibular penetration and aspiration. No evidence of esophageal mass or stricture. Severe esophageal dysmotility. No evidence of  hiatal hernia or gastroesophageal reflux. Electronically Signed   By: Earle Gell M.D.   On: 03/20/2015 10:42   Dg Swallowing Func-speech Pathology  03/21/2015  Objective Swallowing Evaluation: Type of Study: Bedside Swallow Evaluation Patient Details Name: Alan Franco MRN: [id number] Date of Birth: January 03, 1941 Today's Date: 03/21/2015 Time: SLP Start Time (ACUTE ONLY): 1029-SLP Stop Time (ACUTE ONLY): 1058 SLP Time Calculation (min) (ACUTE ONLY): 29 min Past Medical History: Past Medical History Diagnosis Date . ALCOHOL ABUSE 08/24/2009 . CAROTID ARTERY DISEASE 08/30/2009 . CELLULITIS AND ABSCESS OF LEG EXCEPT FOOT 11/27/2009 . COPD 08/24/2009 . CORONARY ARTERY DISEASE 08/24/2009 . 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 GOUT 08/24/2009 . HEPATITIS C 08/24/2009 . HYPERLIPIDEMIA 08/24/2009 . HYPERTENSION 08/24/2009 . KNEE PAIN 02/14/2010 . MRSA 08/24/2009 . PERIPHERAL VASCULAR DISEASE 08/24/2009 . Preventative health care 08/09/2010 . RENAL INSUFFICIENCY 08/24/2009 . SICKLE CELL TRAIT 08/24/2009 . CKD (chronic kidney disease), stage III 02/06/2011 . GERD (gastroesophageal reflux disease) 02/06/2011 . BPH (benign prostatic hyperplasia) 02/06/2011 . Degenerative joint disease 08/10/2011 Past Surgical History: Past Surgical History Procedure Laterality Date . Knee surgury     arthoscopic on the right, Dr. French Ana . Hand surgery Right  HPI: 75 yo male admitted [facility] with acute on chronic respiratory failure.  He has h/o lung cancer with mets, sickle cell trait, COPD, HTN, GERD, left vocal fold paralysis, ETOH use, Hep C and dysphagia.  He underwent esophagram 03/21/15 with findings of severe esophageal dysmotiltiy and aspiration/penetration.  Extrinsic mass effect is seen the cervical esophagus from cervical osteophyte.    Subjective: pt awake in chair Assessment / Plan / Recommendation CHL IP CLINICAL IMPRESSIONS 03/21/2015 Therapy Diagnosis Moderate pharyngeal phase dysphagia;Severe pharyngeal phase dysphagia;Mild oral phase dysphagia Clinical  Impression Pt presents with mild oral and moderate pharyngeal dysphagia with sensorimotor deficits - presumed due to vagus nerve involvement from lung mass.  Pt did aspirate - significantly with cup bolus of nectar with head neutral and tracely with thin via straw with chin tuck.  He did overtly cough with significant amount of aspiration but did not sense trace aspiration.  Swallow response is inconsistent.  SMALL single boluses with head neutral of thin was not aspirated and only tracely penetrated clearing with cued cough/throat clearing.    Mild vallecular residuals noted across consistencies due to weakness that was cleared with reflexive and cued dry swallows.  Barium tablet given with pudding transited through pharynx and esophagus readily.  Recommend medicine with pudding/puree due to pt's dysphagia.  Of note, pt able to cough and expectorate viscous secretions per SLP cue x1.  Encouraged him to maintain tongue base retraction to aid expectoration and subsequently airway protection.  Using live video and teach back, educated pt to findings/aspiration mitigation strategies.  Impact on safety and function Moderate aspiration risk   CHL IP TREATMENT RECOMMENDATION 03/21/2015 Treatment Recommendations Therapy as outlined in treatment plan below   Prognosis 03/21/2015 Prognosis for Safe Diet Advancement Guarded Barriers to Reach Goals Time post onset;Severity of deficits;Other (Comment) Barriers/Prognosis Comment -- CHL IP DIET RECOMMENDATION 03/21/2015 SLP Diet Recommendations Regular solids;Thin liquid Liquid Administration via Cup Medication Administration Whole meds with puree Compensations Slow rate;Small sips/bites;Multiple dry swallows after each bite/sip;Clear throat intermittently Postural Changes Seated upright at 90 degrees;Remain semi-upright after after feeds/meals (Comment)   CHL IP OTHER RECOMMENDATIONS 03/21/2015 Recommended Consults -- Oral Care Recommendations Oral care BID Other Recommendations --    CHL IP FOLLOW UP RECOMMENDATIONS 03/21/2015 Follow up Recommendations Home health SLP   CHL IP FREQUENCY AND DURATION 03/21/2015 Speech Therapy Frequency (ACUTE ONLY) min 2x/week Treatment Duration 2 weeks      CHL IP ORAL PHASE 03/21/2015 Oral Phase Impaired Oral - Pudding Teaspoon -- Oral - Pudding Cup -- Oral - Honey Teaspoon -- Oral - Honey Cup -- Oral - Nectar Teaspoon WFL Oral - Nectar Cup WFL Oral - Nectar Straw -- Oral - Thin Teaspoon Piecemeal swallowing Oral - Thin Cup Piecemeal swallowing Oral - Thin Straw Piecemeal swallowing Oral - Puree WFL Oral - Mech Soft -- Oral - Regular WFL Oral - Multi-Consistency -- Oral - Pill WFL Oral Phase - Comment --  CHL IP PHARYNGEAL PHASE 03/21/2015 Pharyngeal Phase Impaired Pharyngeal- Pudding Teaspoon -- Pharyngeal -- Pharyngeal- Pudding Cup -- Pharyngeal -- Pharyngeal- Honey Teaspoon -- Pharyngeal -- Pharyngeal- Honey Cup -- Pharyngeal -- Pharyngeal- Nectar Teaspoon Reduced laryngeal elevation;Reduced anterior laryngeal mobility;Reduced airway/laryngeal closure;Pharyngeal residue - valleculae Pharyngeal -- Pharyngeal- Nectar Cup Reduced anterior laryngeal mobility;Reduced laryngeal elevation;Reduced airway/laryngeal closure;Moderate aspiration;Penetration/Aspiration before swallow;Pharyngeal residue - valleculae Pharyngeal Material enters airway, passes BELOW cords and not ejected out despite cough attempt by patient Pharyngeal- Nectar Straw -- Pharyngeal -- Pharyngeal- Thin Teaspoon Reduced anterior laryngeal mobility;Reduced laryngeal elevation;Reduced airway/laryngeal closure;Reduced tongue base retraction;Pharyngeal residue - valleculae;Pharyngeal residue - pyriform;Penetration/Aspiration during swallow Pharyngeal Material enters airway, remains ABOVE vocal cords then ejected out Pharyngeal- Thin Cup Reduced anterior laryngeal mobility;Reduced laryngeal elevation;Reduced airway/laryngeal closure;Moderate aspiration;Pharyngeal residue - valleculae Pharyngeal -- Pharyngeal-  Thin Straw Reduced anterior laryngeal mobility;Reduced laryngeal elevation;Reduced airway/laryngeal closure;Trace aspiration;Reduced tongue base retraction;Penetration/Aspiration during swallow Pharyngeal Material enters airway, passes BELOW cords then ejected out Pharyngeal- Puree Reduced anterior laryngeal mobility;Reduced laryngeal elevation;Reduced airway/laryngeal closure;Pharyngeal residue - valleculae Pharyngeal -- Pharyngeal- Mechanical Soft -- Pharyngeal -- Pharyngeal- Regular Reduced  anterior laryngeal mobility;Reduced laryngeal elevation;Reduced airway/laryngeal closure;Pharyngeal residue - valleculae Pharyngeal -- Pharyngeal- Multi-consistency -- Pharyngeal -- Pharyngeal- Pill Reduced anterior laryngeal mobility;Reduced laryngeal elevation;Reduced airway/laryngeal closure;Pharyngeal residue - valleculae Pharyngeal -- Pharyngeal Comment small single boluses with head neutral of thin was not aspirated and only tracely penetration.  Pt with inconsistent swallow response with overt aspiration occuring with nectar head neutral - which fortunately produced reflexive cough response.   Chin tuck with thin via straw tracely aspirated = just below vocal cords and cleared with cued cough.  Mild vallecular residuals noted that decreased with reflexive and cued dry swallows.    CHL IP CERVICAL ESOPHAGEAL PHASE 03/21/2015 Cervical Esophageal Phase Impaired Pudding Teaspoon -- Pudding Cup -- Honey Teaspoon -- Honey Cup -- Nectar Teaspoon -- Nectar Cup -- Nectar Straw -- Thin Teaspoon -- Thin Cup -- Thin Straw -- Puree -- Mechanical Soft -- Regular -- Multi-consistency -- Pill -- Cervical Esophageal Comment appearance of minimally decreased proximal esophagus clearance with thin - and frequent belching- which he states is normal No flowsheet data found. Alan Franco, Alan Franco [facility] SLP [phone]               Micro Results    Recent Results (from the past 240 hour(s))  Urine culture     Status: None    Collection Time: 03/16/15  3:39 PM  Result Value Ref Range Status   Specimen Description URINE, CLEAN CATCH  Final   Special Requests NONE  Final   Culture   Final    NO GROWTH 2 DAYS Performed at [facility]    Report Status 03/18/2015 FINAL  Final  Blood Culture (routine x 2)     Status: None   Collection Time: 03/16/15  3:47 PM  Result Value Ref Range Status   Specimen Description BLOOD PORTA CATH  Final   Special Requests BOTTLES DRAWN AEROBIC AND ANAEROBIC 5ML  Final   Culture   Final    NO GROWTH 5 DAYS Performed at [facility]    Report Status 03/21/2015 FINAL  Final  Blood Culture (routine x 2)     Status: None   Collection Time: 03/16/15  4:05 PM  Result Value Ref Range Status   Specimen Description BLOOD LEFT HAND  Final   Special Requests IN PEDIATRIC BOTTLE 3 CC  Final   Culture   Final    NO GROWTH 5 DAYS Performed at [facility]    Report Status 03/21/2015 FINAL  Final  MRSA PCR Screening     Status: None   Collection Time: 03/16/15 11:13 PM  Result Value Ref Range Status   MRSA by PCR NEGATIVE NEGATIVE Final    Comment:        The GeneXpert MRSA Assay (FDA approved for NASAL specimens only), is one component of a comprehensive MRSA colonization surveillance program. It is not intended to diagnose MRSA infection nor to guide or monitor treatment for MRSA infections.   Respiratory virus panel     Status: Abnormal   Collection Time: 03/17/15  9:50 PM  Result Value Ref Range Status   Respiratory Syncytial Virus A Negative Negative Final   Respiratory Syncytial Virus B Negative Negative Final   Influenza A Negative Negative Final   Influenza B Negative Negative Final   Parainfluenza 1 Negative Negative Final   Parainfluenza 2 Negative Negative Final   Parainfluenza 3 Negative Negative Final   Metapneumovirus Negative Negative Final   Rhinovirus Positive (A) Negative  Final   Adenovirus Negative Negative Final    Comment:  (NOTE) Performed At: [facility] [place], [place] [id number] Lindon Romp MD YQ:[id number]        Today   Subjective:   Ozias Dicenzo today has no headache,no chest or abdominal pain,no new weakness tingling or numbness, feels much better wants to go home today.  Objective:   Blood pressure 153/92, pulse 100, temperature 97.9 F (36.6 C), temperature source Oral, resp. rate 18, height '6\' 1"'$  (1.854 m), weight 66.906 kg (147 lb 8 oz), SpO2 99 %.   Intake/Output Summary (Last 24 hours) at 03/22/15 1317 Last data filed at 03/22/15 1210  Gross per 24 hour  Intake    437 ml  Output   1750 ml  Net  -1313 ml    Exam General: Alert and awake, oriented x3, not in any acute distress. HEENT: anicteric sclera, pupils reactive to light and accommodation, EOMI CVS: S1-S2 clear, no murmur rubs or gallops Chest: clear to auscultation bilaterally, no wheezing, rales or rhonchi Abdomen: soft nontender, nondistended, normal bowel sounds, no organomegaly Extremities: no cyanosis, clubbing or edema noted bilaterally  Data Review   CBC w Diff: Lab Results  Component Value Date   WBC 28.1* 03/21/2015   HGB 9.2* 03/21/2015   HCT 27.2* 03/21/2015   PLT 276 03/21/2015   LYMPHOPCT 4 03/21/2015   MONOPCT 8 03/21/2015   EOSPCT 0 03/21/2015   BASOPCT 0 03/21/2015    CMP: Lab Results  Component Value Date   NA 135 03/21/2015   K 4.0 03/21/2015   CL 104 03/21/2015   CO2 21* 03/21/2015   BUN 54* 03/21/2015   CREATININE 1.40* 03/21/2015   PROT 6.1* 03/21/2015   ALBUMIN 2.3* 03/21/2015   BILITOT 0.4 03/21/2015   ALKPHOS 81 03/21/2015   AST 44* 03/21/2015   ALT 48 03/21/2015  .   Total Time in preparing paper work, data evaluation and todays exam - 35 minutes  Harold Mattes M.D on 03/22/2015 at 1:17 PM  [facility]   Office  [phone]

## 2015-03-22 NOTE — Care Management Note (Signed)
Case Management Note  Patient Details  Name: Alan Franco MRN: [id number] Date of Birth: 10/30/1940  Subjective/Objective:                    Action/Plan:  Home with [facility] for [facility]   Expected Discharge Date:   (unknown)               Expected Discharge Plan:  [place]  In-House Referral:     Discharge planning Services  CM Consult  Post Acute Care Choice:    Choice offered to:  Patient  DME Arranged:    DME Agency:     HH Arranged:  [facility] [place] Agency:  [place]  Status of Service:  Completed, signed off  Medicare Important Message Given:  Yes Date Medicare IM Given:    Medicare IM give by:    Date Additional Medicare IM Given:    Additional Medicare Important Message give by:     If discussed at [place] of Stay Meetings, dates discussed:    Additional CommentsPurcell Mouton, RN 03/22/2015, 3:03 PM

## 2015-03-23 ENCOUNTER — Telehealth: Payer: Self-pay | Admitting: *Deleted

## 2015-03-23 NOTE — Telephone Encounter (Signed)
Pt was on TCM list tried getting him set-up for hosp f/u no answer LMOM RTC...Alan Franco

## 2015-03-23 NOTE — Telephone Encounter (Signed)
Pt called back and he explained he no longer sees Dr. Jenny Reichmann. I went ahead and took Dr. Jenny Reichmann off his chart as his PCP

## 2015-04-21 DEATH — deceased

## 2016-12-24 IMAGING — CR DG CHEST 2V
2 series · 2 of 2 positions shown · non-contrast
Comparison: 08/06/2014

CLINICAL DATA: Was in shower and coughed, hemoptysis of a golf
ball-sized clot of blood, was begun on antifungal medications at [HOSPITAL] 2 days ago, recently diagnosed with stage IV cancer
metastatic to lungs, chest pain, recurrent shortness of breath,
COPD, coronary artery disease, former smoker

EXAM:
CHEST  2 VIEW

[w chest pa]
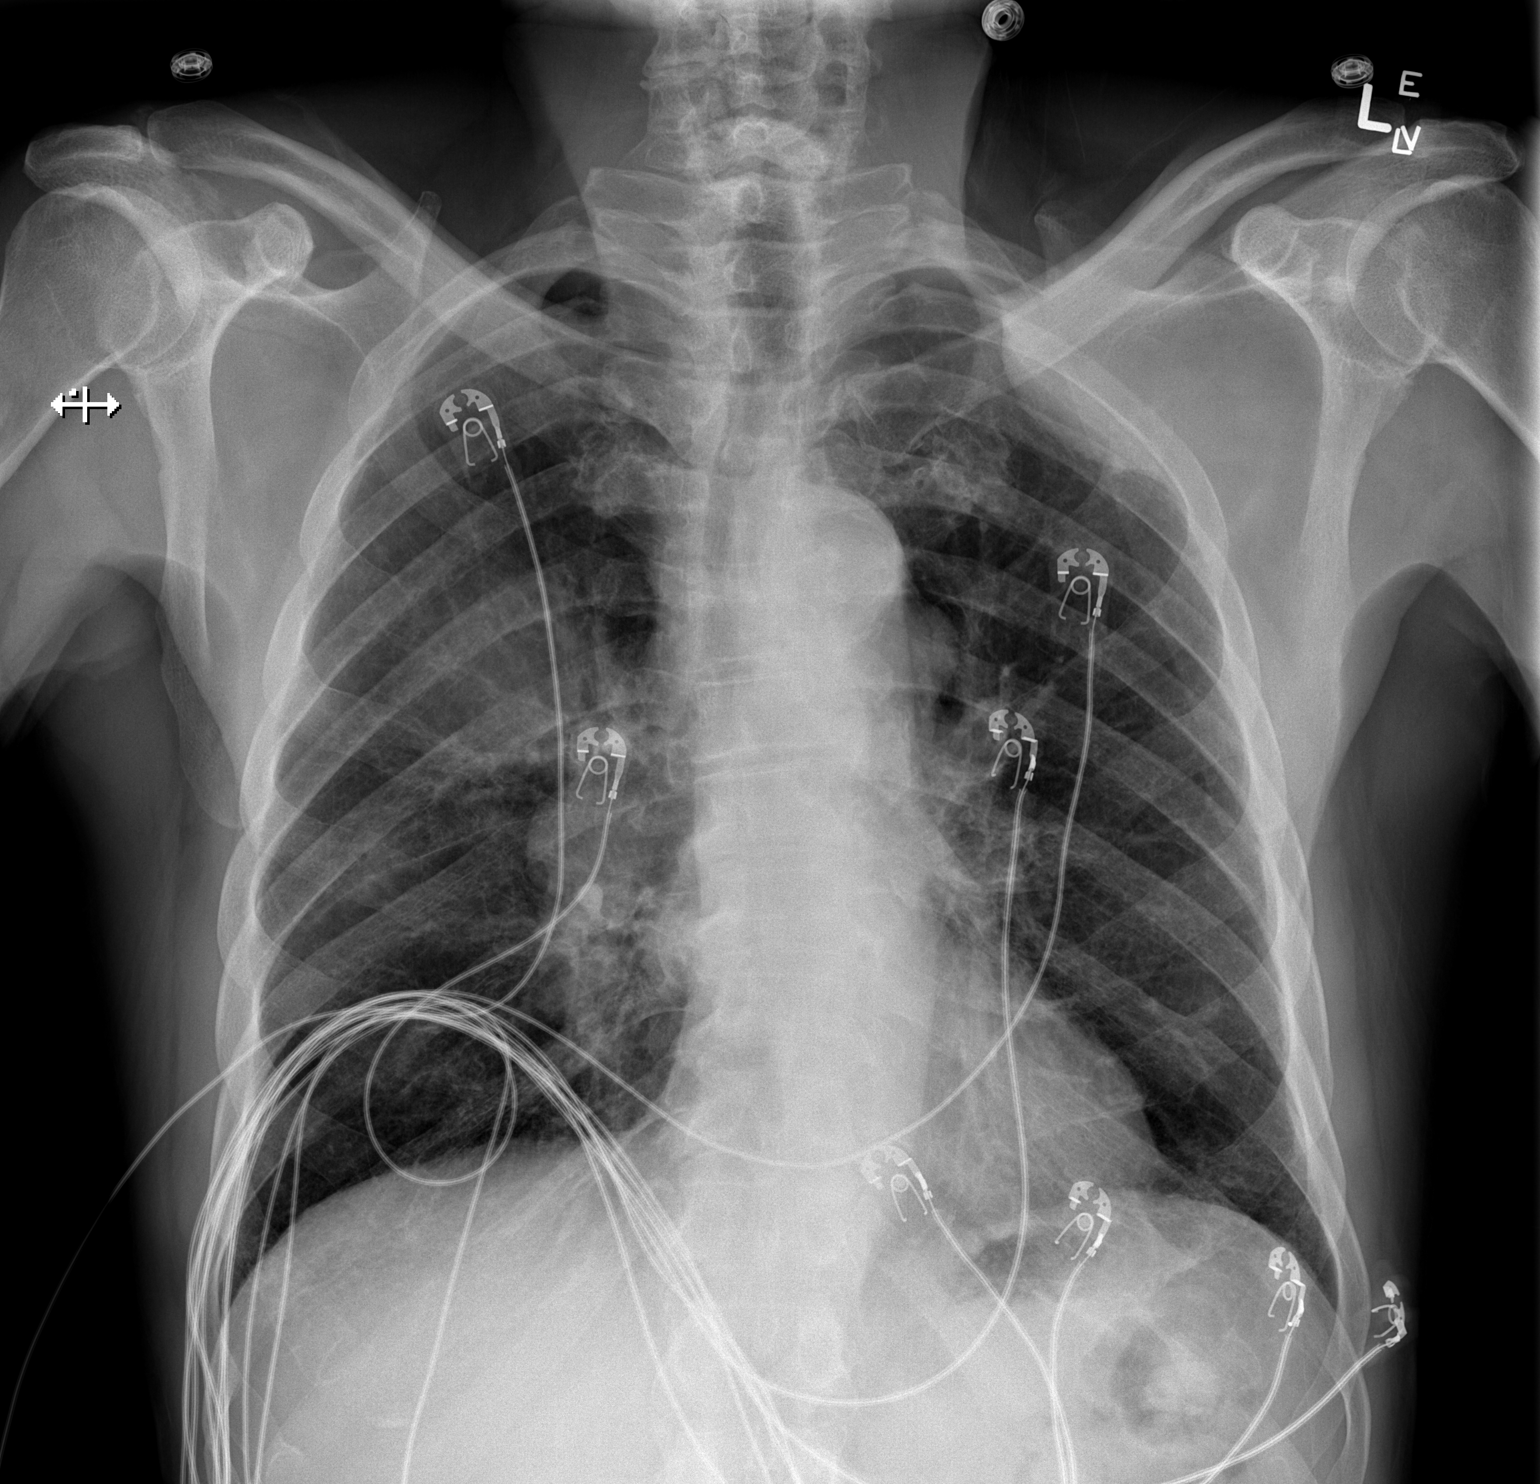

[w chest lat]
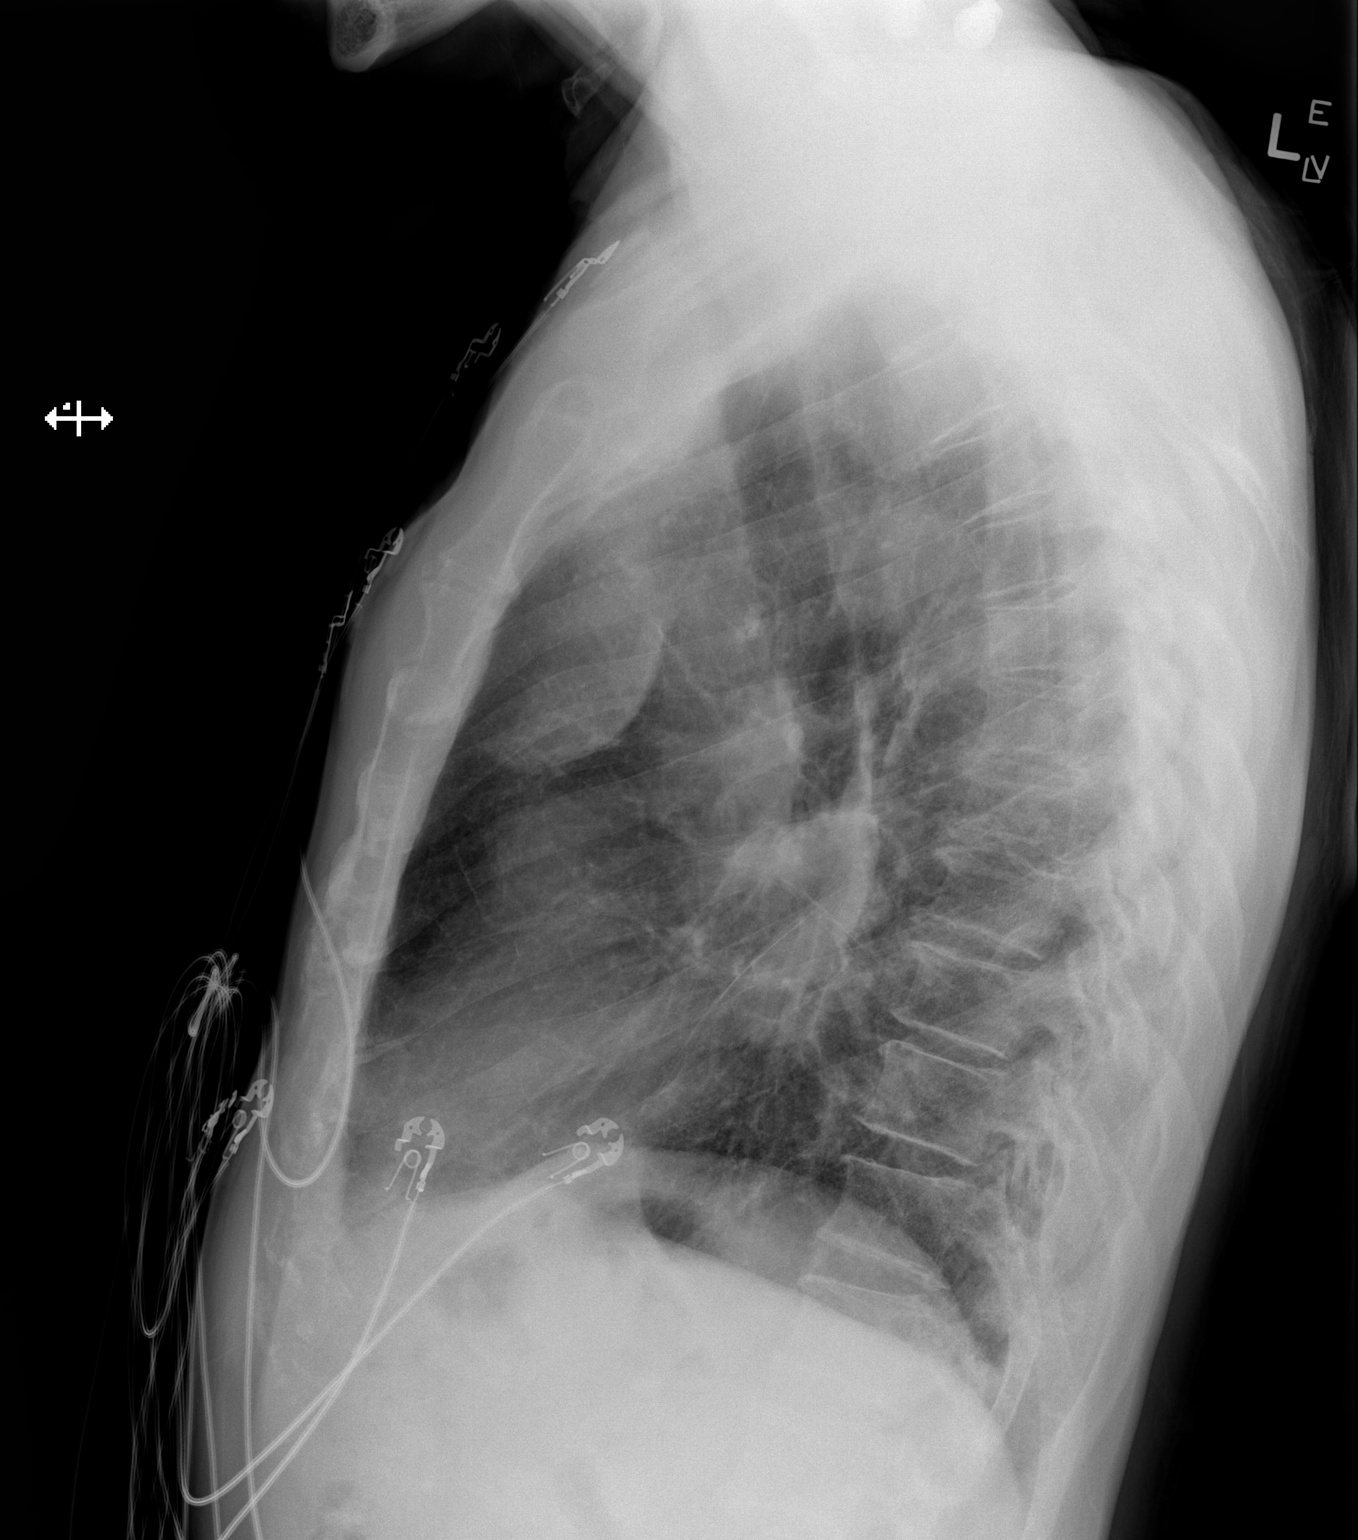

[2 of 2 positions shown; findings below may reference images not displayed]

FINDINGS: Normal heart size and pulmonary vascularity.

Interval enlargement of RIGHT hilum question adenopathy.

RIGHT suprahilar density in perihilar RIGHT upper lobe may either
represent infiltrate pulmonary mass, or a soft tissue mass at the
anterior RIGHT chest wall ; anterior aspect of the RIGHT third rib
is not well visualized.

Remaining lungs mildly hyperinflated but clear.

Questionable nodular density versus nipple shadow lower LEFT chest.

Numerous EKG leads project over chest.

Remaining lungs clear.

No pleural effusion or pneumothorax.

Bone destruction of the lateral LEFT second rib with large
extrapleural soft tissue density most likely representing metastatic
disease.
IMPRESSION: Suspected RIGHT hilar adenopathy/perihilar mass.

RIGHT upper lobe opacity could represent pulmonary or chest wall
metastatic lesion, less likely infiltrate.

Additional destructive osseous metastatic lesion lateral LEFT second
rib.

Correlation with results of prior outside imaging recommended.

## 2017-04-01 IMAGING — CT CT ANGIO CHEST
2 of 6 series · 18 of 36 positions shown · IV contrast (omnipaque)
Comparison: Plain film from earlier in the same day, 01/26/2015

CLINICAL DATA: Shortness of Breath

EXAM:
CT ANGIOGRAPHY CHEST WITH CONTRAST
TECHNIQUE: Multidetector CT imaging of the chest was performed using the
standard protocol during bolus administration of intravenous
contrast. Multiplanar CT image reconstructions and MIPs were
obtained to evaluate the vascular anatomy.
CONTRAST:  100mL OMNIPAQUE IOHEXOL 350 MG/ML SOLN

[Series 5: coronal mpr · coronal · 0.57mm/px · 1 of 115 slices shown]
[im 58/115  mediastinal]
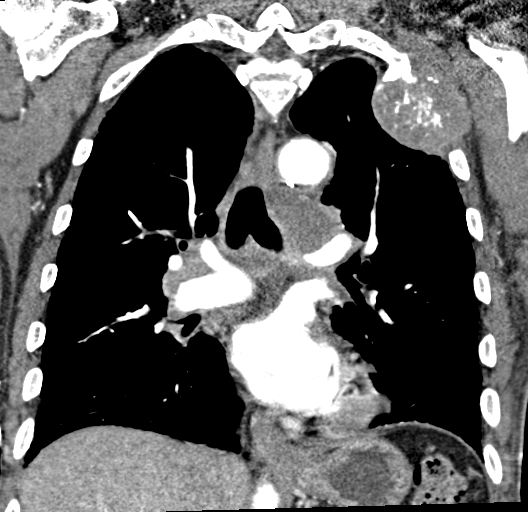

[Series 10: thins for pacs · axial · 0.66mm/px · z∈[+1367,+1631]mm · 17 of 294 slices shown]
[im 15/294  lung]
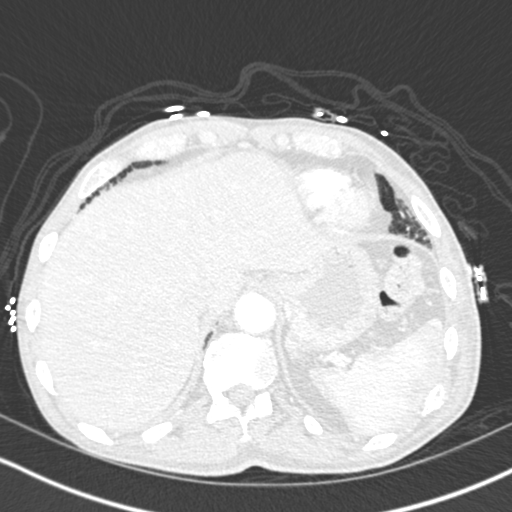
[im 30/294  mediastinal]
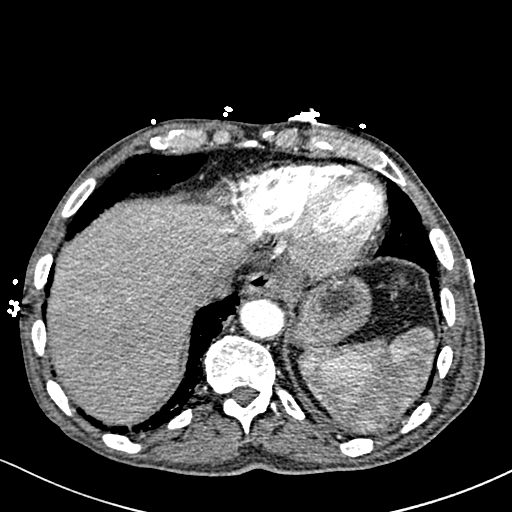
[im 44/294  lung]
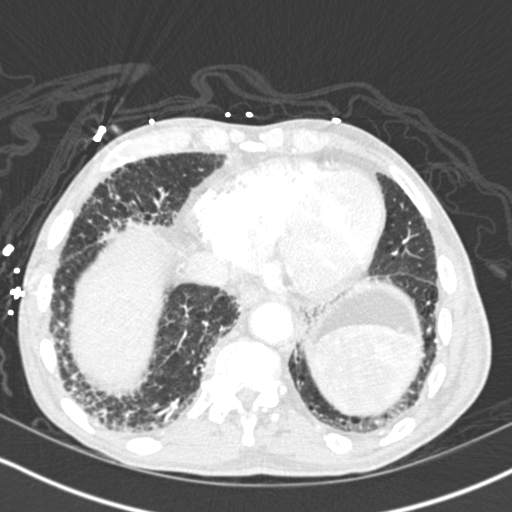
[im 59/294  mediastinal]
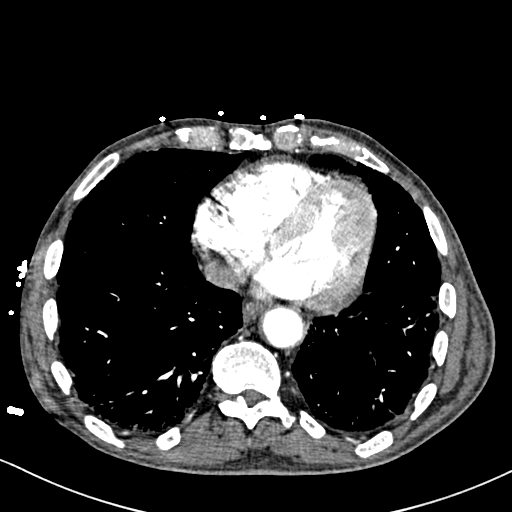
[im 88/294  lung]
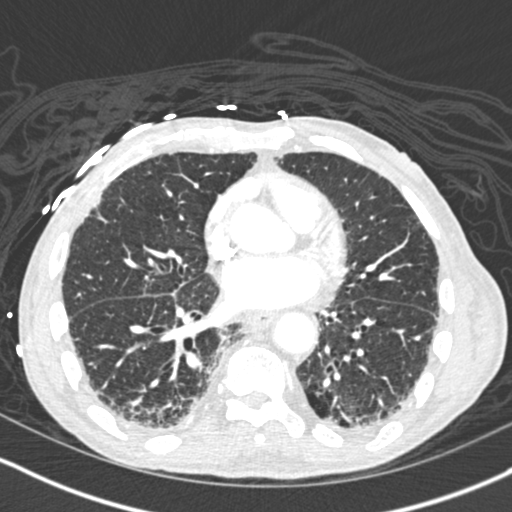
[im 103/294  mediastinal]
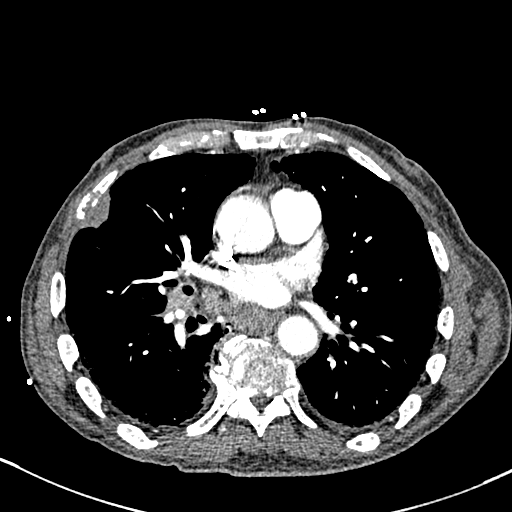
[im 118/294  lung]
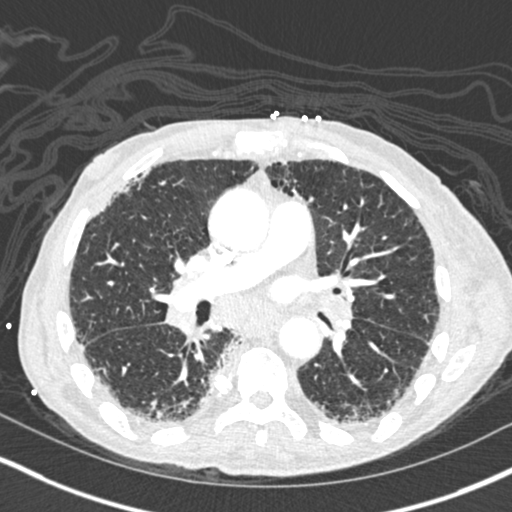
[im 132/294  mediastinal]
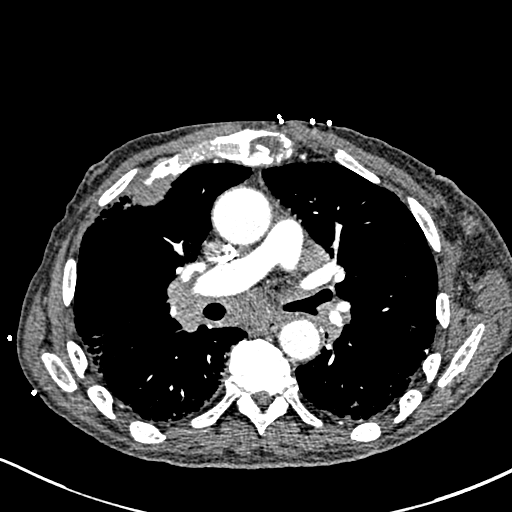
[im 147/294  lung]
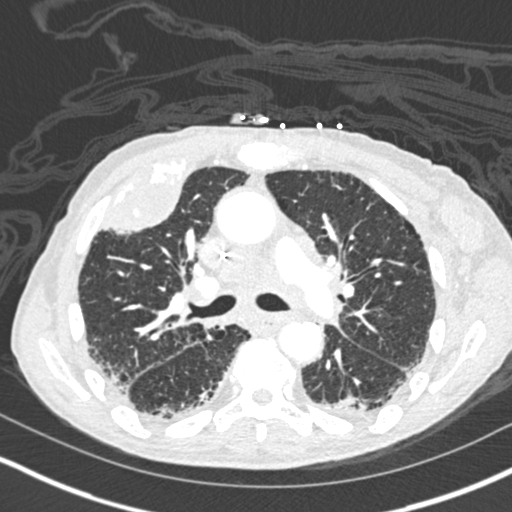
[im 162/294  mediastinal]
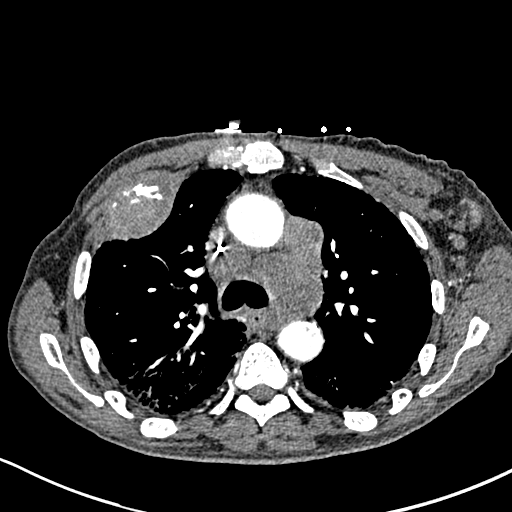
[im 176/294  lung]
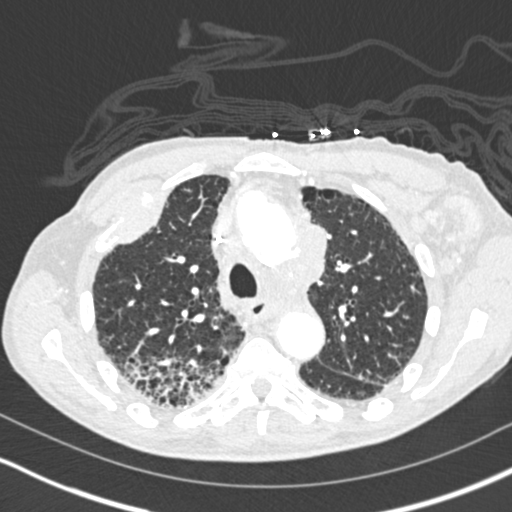
[im 191/294  mediastinal]
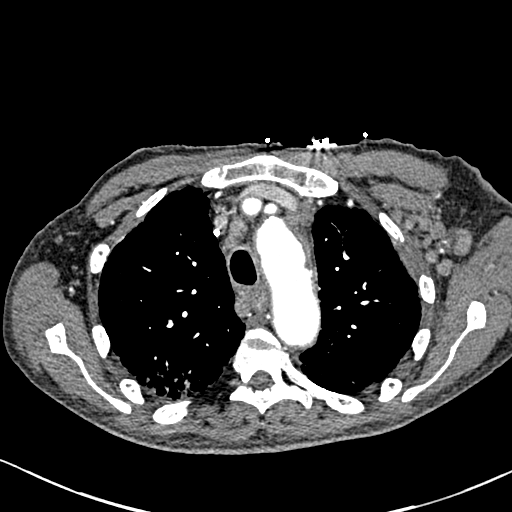
[im 206/294  lung]
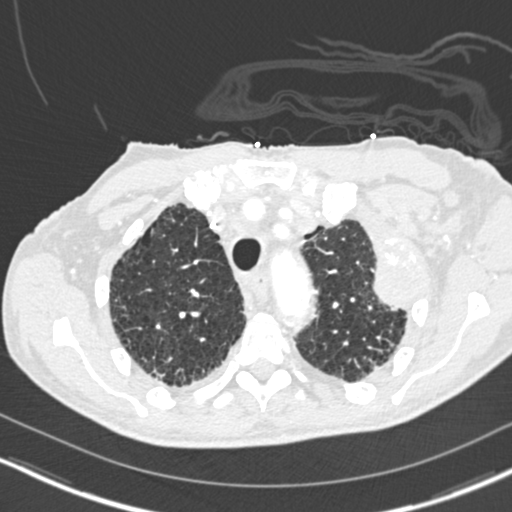
[im 235/294  mediastinal]
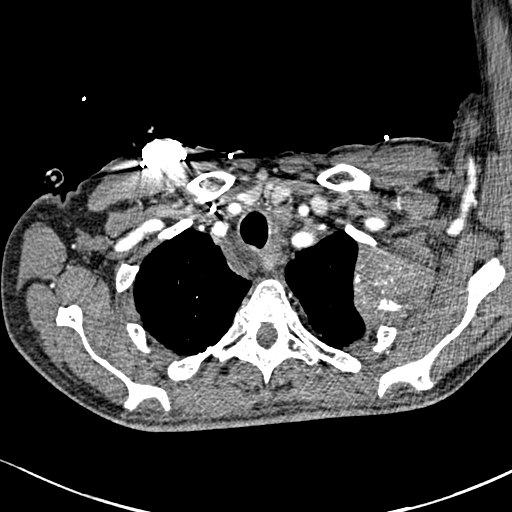
[im 250/294  lung]
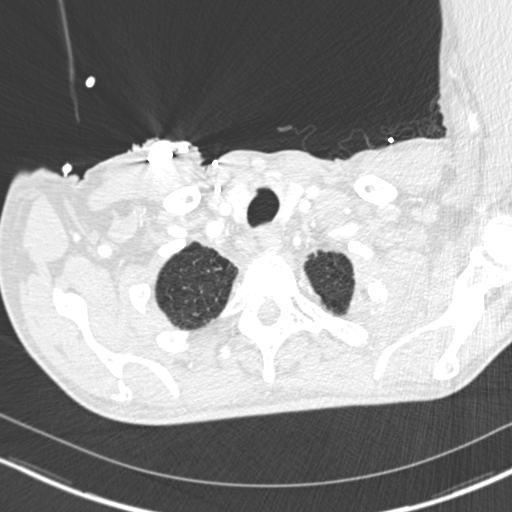
[im 264/294  mediastinal]
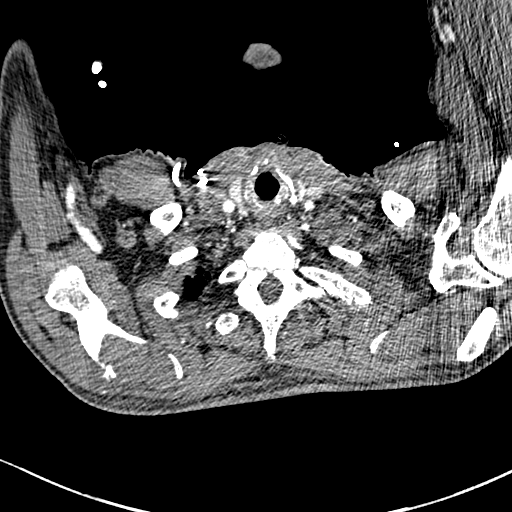
[im 279/294  lung]
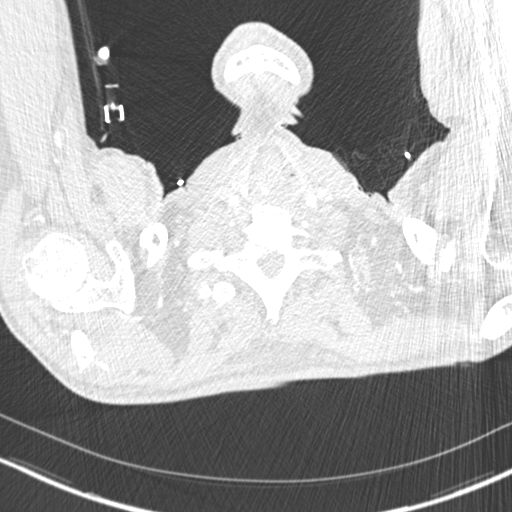

[18 of 36 positions shown; findings below may reference images not displayed]

FINDINGS: Lungs are well aerated bilaterally. Emphysematous changes are again
seen. No acute infiltrate or sizable effusion is noted.

The thoracic inlet is within normal limits. There are multiple
mediastinal lymph nodes identified. 2.2 cm node is noted adjacent to
the origins of the left subclavian and left common carotid artery. A
1.7 cm lymph node is noted adjacent to the origin of the right
subclavian artery. Conglomeration of lymph nodes is noted in the
aortic or pulmonary window. This measures approximately 5.0 x
cm. Precarinal and subcarinal adenopathy is noted as well. Scattered
hilar lymph nodes are noted. The largest of these is mid noted on
the right measuring 2.1 cm in short axis.

The thoracic aorta shows calcifications although no findings of
dissection or aneurysmal dilatation are seen. The pulmonary artery
is well visualized and mildly impinged centrally and on the left due
to the lymphadenopathy. No filling defects to suggest pulmonary
emboli are noted.

The visualized upper abdomen demonstrates metastatic disease within
the left adrenal gland similar to that seen on prior exam. The bony
structures show degenerative change of the thoracic spine.
Additionally there are expansile lesions within the rib cage
bilaterally involving primarily the right third rib anteriorly as
well of the left second rib. Expansile lesion of the right fifth rib
is noted as well. Destructive lesion of the mid sternum is noted as
well. These changes are consistent with metastatic disease. Right
chest wall port is again noted.

Review of the MIP images confirms the above findings.
IMPRESSION: Multiple bony metastatic lesions involving the rib cage bilaterally
as well as the sternum.

Diffuse hilar and mediastinal adenopathy consistent with the
patient's given clinical history of lung carcinoma. This contributes
to some degree of compression on the pulmonary artery centrally and
eccentric to the left.

Left adrenal metastatic disease

No evidence of pulmonary emboli.

Chronic changes in the lungs bilaterally.

Although not mentioned in the body of the report there is
significant left axillary and chest wall lymphadenopathy identified.
